# Patient Record
Sex: Male | Born: 1935
Health system: Southern US, Community
[De-identification: ages and names within clinical notes are randomized; demographics above are authoritative.]

## PROBLEM LIST (undated history)

## (undated) DIAGNOSIS — I472 Ventricular tachycardia, unspecified: Secondary | ICD-10-CM

## (undated) DIAGNOSIS — E785 Hyperlipidemia, unspecified: Secondary | ICD-10-CM

## (undated) DIAGNOSIS — L509 Urticaria, unspecified: Secondary | ICD-10-CM

## (undated) DIAGNOSIS — R251 Tremor, unspecified: Secondary | ICD-10-CM

## (undated) DIAGNOSIS — I441 Atrioventricular block, second degree: Secondary | ICD-10-CM

## (undated) DIAGNOSIS — I119 Hypertensive heart disease without heart failure: Secondary | ICD-10-CM

## (undated) DIAGNOSIS — F411 Generalized anxiety disorder: Secondary | ICD-10-CM

## (undated) DIAGNOSIS — I2510 Atherosclerotic heart disease of native coronary artery without angina pectoris: Secondary | ICD-10-CM

## (undated) DIAGNOSIS — Z9289 Personal history of other medical treatment: Secondary | ICD-10-CM

## (undated) DIAGNOSIS — I4892 Unspecified atrial flutter: Secondary | ICD-10-CM

## (undated) DIAGNOSIS — C8330 Diffuse large B-cell lymphoma, unspecified site: Secondary | ICD-10-CM

## (undated) DIAGNOSIS — M1990 Unspecified osteoarthritis, unspecified site: Secondary | ICD-10-CM

## (undated) HISTORY — DX: Hypertensive heart disease without heart failure: I11.9

## (undated) HISTORY — DX: Generalized anxiety disorder: F41.1

## (undated) HISTORY — PX: CATARACT EXTRACTION, BILATERAL: SHX1313

## (undated) HISTORY — DX: Personal history of other medical treatment: Z92.89

## (undated) HISTORY — DX: Urticaria, unspecified: L50.9

## (undated) HISTORY — DX: Hyperlipidemia, unspecified: E78.5

## (undated) HISTORY — DX: Atrioventricular block, second degree: I44.1

## (undated) HISTORY — DX: Unspecified atrial flutter: I48.92

## (undated) HISTORY — DX: Ventricular tachycardia, unspecified: I47.20

## (undated) HISTORY — DX: Atherosclerotic heart disease of native coronary artery without angina pectoris: I25.10

## (undated) HISTORY — DX: Ventricular tachycardia: I47.2

## (undated) HISTORY — DX: Tremor, unspecified: R25.1

---
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
## 2005-08-04 ENCOUNTER — Emergency Department: Payer: Self-pay | Admitting: Emergency Medicine

## 2005-08-19 ENCOUNTER — Ambulatory Visit: Payer: Self-pay | Admitting: Family Medicine

## 2005-08-20 ENCOUNTER — Ambulatory Visit: Payer: Self-pay | Admitting: Oncology

## 2005-08-28 ENCOUNTER — Ambulatory Visit: Payer: Self-pay | Admitting: Internal Medicine

## 2005-09-04 ENCOUNTER — Ambulatory Visit: Payer: Self-pay | Admitting: Oncology

## 2005-09-09 ENCOUNTER — Ambulatory Visit: Payer: Self-pay | Admitting: General Surgery

## 2005-09-09 ENCOUNTER — Other Ambulatory Visit: Payer: Self-pay

## 2005-09-16 ENCOUNTER — Ambulatory Visit: Payer: Self-pay | Admitting: General Surgery

## 2005-09-18 ENCOUNTER — Ambulatory Visit: Payer: Self-pay | Admitting: Oncology

## 2005-10-18 ENCOUNTER — Ambulatory Visit: Payer: Self-pay | Admitting: Oncology

## 2005-11-18 ENCOUNTER — Ambulatory Visit: Payer: Self-pay | Admitting: Oncology

## 2005-12-19 ENCOUNTER — Ambulatory Visit: Payer: Self-pay | Admitting: Oncology

## 2006-01-18 ENCOUNTER — Ambulatory Visit: Payer: Self-pay | Admitting: Oncology

## 2006-01-22 ENCOUNTER — Ambulatory Visit: Payer: Self-pay | Admitting: Nurse Practitioner

## 2006-02-18 ENCOUNTER — Ambulatory Visit: Payer: Self-pay | Admitting: Oncology

## 2006-03-20 ENCOUNTER — Ambulatory Visit: Payer: Self-pay | Admitting: Oncology

## 2006-04-20 ENCOUNTER — Ambulatory Visit: Payer: Self-pay | Admitting: Oncology

## 2006-05-21 ENCOUNTER — Ambulatory Visit: Payer: Self-pay | Admitting: Oncology

## 2006-06-19 ENCOUNTER — Ambulatory Visit: Payer: Self-pay | Admitting: Oncology

## 2006-09-16 ENCOUNTER — Ambulatory Visit: Payer: Self-pay | Admitting: Oncology

## 2006-09-19 ENCOUNTER — Ambulatory Visit: Payer: Self-pay | Admitting: Oncology

## 2006-09-20 ENCOUNTER — Ambulatory Visit: Payer: Self-pay | Admitting: Oncology

## 2006-10-19 ENCOUNTER — Ambulatory Visit: Payer: Self-pay | Admitting: Oncology

## 2006-11-19 ENCOUNTER — Ambulatory Visit: Payer: Self-pay | Admitting: Oncology

## 2007-01-12 ENCOUNTER — Ambulatory Visit: Payer: Self-pay | Admitting: Unknown Physician Specialty

## 2007-01-19 ENCOUNTER — Ambulatory Visit: Payer: Self-pay | Admitting: Oncology

## 2007-02-10 ENCOUNTER — Ambulatory Visit: Payer: Self-pay | Admitting: Oncology

## 2007-02-19 ENCOUNTER — Ambulatory Visit: Payer: Self-pay | Admitting: Oncology

## 2007-04-21 ENCOUNTER — Ambulatory Visit: Payer: Self-pay | Admitting: Oncology

## 2007-04-27 ENCOUNTER — Ambulatory Visit: Payer: Self-pay | Admitting: Unknown Physician Specialty

## 2007-05-04 ENCOUNTER — Ambulatory Visit: Payer: Self-pay | Admitting: Oncology

## 2007-05-06 ENCOUNTER — Ambulatory Visit: Payer: Self-pay | Admitting: Oncology

## 2007-05-22 ENCOUNTER — Ambulatory Visit: Payer: Self-pay | Admitting: Oncology

## 2007-09-06 ENCOUNTER — Ambulatory Visit: Payer: Self-pay | Admitting: Unknown Physician Specialty

## 2007-09-23 ENCOUNTER — Ambulatory Visit: Payer: Self-pay | Admitting: Oncology

## 2007-10-19 ENCOUNTER — Ambulatory Visit: Payer: Self-pay | Admitting: Oncology

## 2007-12-04 ENCOUNTER — Emergency Department: Payer: Self-pay | Admitting: Emergency Medicine

## 2007-12-04 ENCOUNTER — Other Ambulatory Visit: Payer: Self-pay

## 2008-01-19 ENCOUNTER — Ambulatory Visit: Payer: Self-pay | Admitting: Oncology

## 2008-02-19 ENCOUNTER — Ambulatory Visit: Payer: Self-pay | Admitting: Oncology

## 2008-05-17 ENCOUNTER — Ambulatory Visit: Payer: Self-pay | Admitting: Oncology

## 2008-05-21 ENCOUNTER — Ambulatory Visit: Payer: Self-pay | Admitting: Oncology

## 2008-05-25 ENCOUNTER — Ambulatory Visit: Payer: Self-pay | Admitting: Oncology

## 2008-06-18 ENCOUNTER — Ambulatory Visit: Payer: Self-pay | Admitting: Oncology

## 2008-11-18 ENCOUNTER — Ambulatory Visit: Payer: Self-pay | Admitting: Oncology

## 2008-11-21 ENCOUNTER — Ambulatory Visit: Payer: Self-pay | Admitting: Oncology

## 2008-12-19 ENCOUNTER — Ambulatory Visit: Payer: Self-pay | Admitting: Oncology

## 2009-05-21 ENCOUNTER — Ambulatory Visit: Payer: Self-pay | Admitting: Oncology

## 2009-06-18 ENCOUNTER — Ambulatory Visit: Payer: Self-pay | Admitting: Oncology

## 2009-09-29 ENCOUNTER — Emergency Department: Payer: Self-pay | Admitting: Emergency Medicine

## 2009-11-18 ENCOUNTER — Ambulatory Visit: Payer: Self-pay | Admitting: Oncology

## 2009-11-20 ENCOUNTER — Ambulatory Visit: Payer: Self-pay | Admitting: Oncology

## 2009-12-19 ENCOUNTER — Ambulatory Visit: Payer: Self-pay | Admitting: Oncology

## 2010-06-16 ENCOUNTER — Ambulatory Visit: Payer: Self-pay | Admitting: Oncology

## 2010-06-19 ENCOUNTER — Ambulatory Visit: Payer: Self-pay | Admitting: Oncology

## 2010-11-16 ENCOUNTER — Inpatient Hospital Stay: Payer: Self-pay | Admitting: Internal Medicine

## 2010-11-17 HISTORY — PX: CARDIAC CATHETERIZATION: SHX172

## 2011-02-24 DIAGNOSIS — I25118 Atherosclerotic heart disease of native coronary artery with other forms of angina pectoris: Secondary | ICD-10-CM | POA: Insufficient documentation

## 2011-02-24 DIAGNOSIS — I452 Bifascicular block: Secondary | ICD-10-CM | POA: Insufficient documentation

## 2011-02-24 DIAGNOSIS — I2510 Atherosclerotic heart disease of native coronary artery without angina pectoris: Secondary | ICD-10-CM | POA: Insufficient documentation

## 2011-06-18 ENCOUNTER — Ambulatory Visit: Payer: Self-pay | Admitting: Oncology

## 2011-06-18 LAB — CBC CANCER CENTER
Basophil #: 0 x10 3/mm (ref 0.0–0.1)
Eosinophil #: 0.2 x10 3/mm (ref 0.0–0.7)
Eosinophil %: 4 %
Lymphocyte #: 1.6 x10 3/mm (ref 1.0–3.6)
Lymphocyte %: 30.4 %
MCH: 31.4 pg (ref 26.0–34.0)
MCV: 91 fL (ref 80–100)
Monocyte #: 0.4 x10 3/mm (ref 0.0–0.7)
Neutrophil %: 57.8 %
Platelet: 130 x10 3/mm — ABNORMAL LOW (ref 150–440)
RBC: 4.2 10*6/uL — ABNORMAL LOW (ref 4.40–5.90)
RDW: 12.9 % (ref 11.5–14.5)

## 2011-06-18 LAB — COMPREHENSIVE METABOLIC PANEL
Albumin: 3.8 g/dL (ref 3.4–5.0)
Alkaline Phosphatase: 57 U/L (ref 50–136)
Anion Gap: 8 (ref 7–16)
BUN: 23 mg/dL — ABNORMAL HIGH (ref 7–18)
Calcium, Total: 9.6 mg/dL (ref 8.5–10.1)
Creatinine: 1.32 mg/dL — ABNORMAL HIGH (ref 0.60–1.30)
Potassium: 4.5 mmol/L (ref 3.5–5.1)
SGOT(AST): 34 U/L (ref 15–37)
Total Protein: 7.2 g/dL (ref 6.4–8.2)

## 2011-06-18 LAB — LACTATE DEHYDROGENASE: LDH: 152 U/L (ref 87–241)

## 2011-06-19 ENCOUNTER — Ambulatory Visit: Payer: Self-pay | Admitting: Oncology

## 2012-06-18 ENCOUNTER — Ambulatory Visit: Payer: Self-pay | Admitting: Oncology

## 2012-06-22 LAB — COMPREHENSIVE METABOLIC PANEL
Albumin: 3.7 g/dL (ref 3.4–5.0)
Alkaline Phosphatase: 58 U/L (ref 50–136)
BUN: 21 mg/dL — ABNORMAL HIGH (ref 7–18)
Bilirubin,Total: 0.6 mg/dL (ref 0.2–1.0)
Calcium, Total: 9.1 mg/dL (ref 8.5–10.1)
Chloride: 103 mmol/L (ref 98–107)
Co2: 33 mmol/L — ABNORMAL HIGH (ref 21–32)
Creatinine: 1.27 mg/dL (ref 0.60–1.30)
EGFR (African American): >60
EGFR (Non-African Amer.): 52 — ABNORMAL LOW
Glucose: 96 mg/dL (ref 65–99)
Osmolality: 284 (ref 275–301)
Potassium: 4.4 mmol/L (ref 3.5–5.1)
SGOT(AST): 53 U/L — ABNORMAL HIGH (ref 15–37)

## 2012-06-22 LAB — CBC CANCER CENTER
HCT: 37.5 % — ABNORMAL LOW (ref 40.0–52.0)
HGB: 12.8 g/dL — ABNORMAL LOW (ref 13.0–18.0)
Lymphocyte #: 1.4 x10 3/mm (ref 1.0–3.6)
MCHC: 34.2 g/dL (ref 32.0–36.0)
MCV: 91 fL (ref 80–100)
Monocyte %: 8.2 %
Neutrophil %: 54.8 %
Platelet: 104 x10 3/mm — ABNORMAL LOW (ref 150–440)
RDW: 13 % (ref 11.5–14.5)
WBC: 4.6 x10 3/mm (ref 3.8–10.6)

## 2012-06-22 LAB — LACTATE DEHYDROGENASE: LDH: 174 U/L (ref 85–241)

## 2012-07-19 ENCOUNTER — Ambulatory Visit: Payer: Self-pay | Admitting: Oncology

## 2013-06-02 ENCOUNTER — Emergency Department: Payer: Self-pay | Admitting: Emergency Medicine

## 2013-06-02 LAB — HEPATIC FUNCTION PANEL A (ARMC)
ALBUMIN: 4.1 g/dL (ref 3.4–5.0)
ALK PHOS: 52 U/L
ALT: 52 U/L (ref 12–78)
Bilirubin, Direct: 0.1 mg/dL (ref 0.00–0.20)
Bilirubin,Total: 0.4 mg/dL (ref 0.2–1.0)
SGOT(AST): 53 U/L — ABNORMAL HIGH (ref 15–37)
Total Protein: 7.2 g/dL (ref 6.4–8.2)

## 2013-06-02 LAB — CBC WITH DIFFERENTIAL/PLATELET
Basophil #: 0 10*3/uL (ref 0.0–0.1)
Basophil %: 0.3 %
Eosinophil #: 0.2 10*3/uL (ref 0.0–0.7)
Eosinophil %: 2.8 %
HCT: 38 % — ABNORMAL LOW (ref 40.0–52.0)
HGB: 13.1 g/dL (ref 13.0–18.0)
LYMPHS ABS: 1.3 10*3/uL (ref 1.0–3.6)
LYMPHS PCT: 22.3 %
MCH: 31.7 pg (ref 26.0–34.0)
MCHC: 34.4 g/dL (ref 32.0–36.0)
MCV: 92 fL (ref 80–100)
MONOS PCT: 6.7 %
Monocyte #: 0.4 x10 3/mm (ref 0.2–1.0)
Neutrophil #: 4.1 10*3/uL (ref 1.4–6.5)
Neutrophil %: 67.9 %
Platelet: 120 10*3/uL — ABNORMAL LOW (ref 150–440)
RBC: 4.12 10*6/uL — AB (ref 4.40–5.90)
RDW: 12.8 % (ref 11.5–14.5)
WBC: 6 10*3/uL (ref 3.8–10.6)

## 2013-06-02 LAB — URINALYSIS, COMPLETE
Bacteria: NONE SEEN
Bilirubin,UR: NEGATIVE
Blood: NEGATIVE
GLUCOSE, UR: NEGATIVE mg/dL (ref 0–75)
Ketone: NEGATIVE
LEUKOCYTE ESTERASE: NEGATIVE
Nitrite: NEGATIVE
PH: 5 (ref 4.5–8.0)
Protein: NEGATIVE
RBC, UR: NONE SEEN /HPF (ref 0–5)
SPECIFIC GRAVITY: 1.008 (ref 1.003–1.030)
SQUAMOUS EPITHELIAL: NONE SEEN

## 2013-06-02 LAB — BASIC METABOLIC PANEL
Anion Gap: 4 — ABNORMAL LOW (ref 7–16)
BUN: 26 mg/dL — ABNORMAL HIGH (ref 7–18)
CHLORIDE: 103 mmol/L (ref 98–107)
Calcium, Total: 9.5 mg/dL (ref 8.5–10.1)
Co2: 31 mmol/L (ref 21–32)
Creatinine: 1.24 mg/dL (ref 0.60–1.30)
EGFR (African American): >60
EGFR (Non-African Amer.): 53 — ABNORMAL LOW
Glucose: 90 mg/dL (ref 65–99)
Osmolality: 280 (ref 275–301)
Potassium: 4.9 mmol/L (ref 3.5–5.1)
Sodium: 138 mmol/L (ref 136–145)

## 2013-06-02 LAB — LIPASE, BLOOD: Lipase: 137 U/L (ref 73–393)

## 2013-06-02 LAB — TROPONIN I: Troponin-I: <0.02 ng/mL

## 2013-06-21 ENCOUNTER — Ambulatory Visit: Payer: Self-pay | Admitting: Oncology

## 2013-06-21 LAB — CBC CANCER CENTER
Basophil #: 0 x10 3/mm (ref 0.0–0.1)
Basophil %: 0.6 %
EOS ABS: 0.2 x10 3/mm (ref 0.0–0.7)
Eosinophil %: 4.1 %
HCT: 38.6 % — AB (ref 40.0–52.0)
HGB: 12.6 g/dL — AB (ref 13.0–18.0)
Lymphocyte #: 1.4 x10 3/mm (ref 1.0–3.6)
Lymphocyte %: 27.9 %
MCH: 30.1 pg (ref 26.0–34.0)
MCHC: 32.7 g/dL (ref 32.0–36.0)
MCV: 92 fL (ref 80–100)
MONOS PCT: 7.5 %
Monocyte #: 0.4 x10 3/mm (ref 0.2–1.0)
NEUTROS ABS: 3 x10 3/mm (ref 1.4–6.5)
Neutrophil %: 59.9 %
PLATELETS: 100 x10 3/mm — AB (ref 150–440)
RBC: 4.19 10*6/uL — ABNORMAL LOW (ref 4.40–5.90)
RDW: 12.9 % (ref 11.5–14.5)
WBC: 5.1 x10 3/mm (ref 3.8–10.6)

## 2013-06-21 LAB — COMPREHENSIVE METABOLIC PANEL
ALBUMIN: 3.6 g/dL (ref 3.4–5.0)
ALK PHOS: 49 U/L
AST: 34 U/L (ref 15–37)
Anion Gap: 4 — ABNORMAL LOW (ref 7–16)
BUN: 23 mg/dL — AB (ref 7–18)
Bilirubin,Total: 0.3 mg/dL (ref 0.2–1.0)
CALCIUM: 9 mg/dL (ref 8.5–10.1)
CO2: 32 mmol/L (ref 21–32)
Chloride: 105 mmol/L (ref 98–107)
Creatinine: 1.18 mg/dL (ref 0.60–1.30)
GFR CALC NON AF AMER: 57 — AB
GLUCOSE: 104 mg/dL — AB (ref 65–99)
OSMOLALITY: 285 (ref 275–301)
Potassium: 5 mmol/L (ref 3.5–5.1)
SGPT (ALT): 43 U/L (ref 12–78)
SODIUM: 141 mmol/L (ref 136–145)
Total Protein: 7 g/dL (ref 6.4–8.2)

## 2013-07-19 ENCOUNTER — Ambulatory Visit: Payer: Self-pay | Admitting: Oncology

## 2013-08-30 ENCOUNTER — Encounter: Payer: Self-pay | Admitting: Cardiovascular Disease

## 2013-08-30 ENCOUNTER — Ambulatory Visit (INDEPENDENT_AMBULATORY_CARE_PROVIDER_SITE_OTHER): Payer: Medicare Other | Admitting: Cardiovascular Disease

## 2013-08-30 VITALS — BP 142/84 | HR 71 | Ht 68.5 in | Wt 175.5 lb

## 2013-08-30 DIAGNOSIS — I4729 Other ventricular tachycardia: Secondary | ICD-10-CM

## 2013-08-30 DIAGNOSIS — F419 Anxiety disorder, unspecified: Secondary | ICD-10-CM | POA: Insufficient documentation

## 2013-08-30 DIAGNOSIS — Z8679 Personal history of other diseases of the circulatory system: Secondary | ICD-10-CM | POA: Insufficient documentation

## 2013-08-30 DIAGNOSIS — I10 Essential (primary) hypertension: Secondary | ICD-10-CM

## 2013-08-30 DIAGNOSIS — F411 Generalized anxiety disorder: Secondary | ICD-10-CM

## 2013-08-30 DIAGNOSIS — R42 Dizziness and giddiness: Secondary | ICD-10-CM

## 2013-08-30 DIAGNOSIS — R9431 Abnormal electrocardiogram [ECG] [EKG]: Secondary | ICD-10-CM | POA: Insufficient documentation

## 2013-08-30 DIAGNOSIS — I472 Ventricular tachycardia, unspecified: Secondary | ICD-10-CM

## 2013-08-30 DIAGNOSIS — C8590 Non-Hodgkin lymphoma, unspecified, unspecified site: Secondary | ICD-10-CM | POA: Insufficient documentation

## 2013-08-30 DIAGNOSIS — C8589 Other specified types of non-Hodgkin lymphoma, extranodal and solid organ sites: Secondary | ICD-10-CM

## 2013-08-30 NOTE — Assessment & Plan Note (Signed)
Prior notes and 2012 suggesting he had sustained VT. He denies having any further episodes even under the observation of his prior cardiologist. Burnis Medin suggest a 30 day monitor for any recurrent dizziness not associated with standing/orthostasis.

## 2013-08-30 NOTE — Assessment & Plan Note (Signed)
Dizziness symptoms were discussed with him. Very concerning in the setting of prior ventricular tachycardia per the notes from 2012 at [facility]. Symptoms only happen when standing, not when sitting or laying down suggesting this could be orthostasis. No symptoms since his enalapril dose was decreased. After long discussion with the patient and his wife, we will hold off on a 30 day monitor her own as he has additional symptoms particularly with sitting or laying down. Encouraged him to drink more fluids as tolerated. They will call me for any additional symptoms and monitor will be ordered. No further ischemia workup as he had minimal CAD several years ago. Normal echocardiogram..

## 2013-08-30 NOTE — Assessment & Plan Note (Signed)
Previously noted right bundle branch block, left anterior fascicular block. No further workup at this time. He symptomatically.

## 2013-08-30 NOTE — Patient Instructions (Signed)
You are doing well. No medication changes were made.  Please call the office if you have more dizzy spells,  Especially when sitting or laying down  Please call us if you have new issues that need to be addressed before your next appt.  Your physician wants you to follow-up in: 12 months.  You will receive a reminder letter in the mail two months in advance. If you don't receive a letter, please call our office to schedule the follow-up appointment.

## 2013-08-30 NOTE — Assessment & Plan Note (Signed)
Followed by [name]. 

## 2013-08-30 NOTE — Assessment & Plan Note (Signed)
Blood pressure continues to be well controlled on low-dose enalapril. No changes made Orthostatics done today. Systolic pressure 940 supine, 140 sitting, 132 standing. Minimal change in heart rate at 2 minutes. Heart rate up from 50s up to 70 after 5 minutes of standing

## 2013-08-30 NOTE — Assessment & Plan Note (Addendum)
Followed at [facility] with periodic observation. Previously had 7 rounds of chemotherapy

## 2013-08-30 NOTE — Progress Notes (Signed)
Patient ID: Clinton Joseph, male    DOB: [birth year], 80 y.o.   MRN: [id number]  HPI Comments: [name] is a very pleasant 80 year old gentleman previously seen by a cardiologist out of town presents to our office to establish care. Prior history of lymphoma 2006 with chemotherapy x7 cycles at that time. Notes indicating run of VT, sustained with presentation to the emergency room in 2012. Rate was 150 beats per minute lasting more than 3 seconds. He was given IV amiodarone and converted to normal sinus rhythm. Evaluation at that time showing nonobstructive CAD, normal ejection fraction. History of hypertension  He presents by referral from Dr. [name] for dizziness.  Reports that his dizziness typically happens when standing, never when sitting or laying down. Symptoms seem to have resolved over the past few weeks after his enalapril dose was decreased from 20 mg down to 10 mg. Wife reports that he has also been drinking more fluids. Prior to that he did not drink very much. Overall he reports that he feels well. He is not very active, does not do a regular exercise program. Notes indicate a history of anxiety, free diabetes  EKG today  shows normal sinus rhythm with rate 59 beats a minute, right bundle branch block, left anterior fascicular block  Prior cardiac catheterization showed 40% proximal RCA disease, minimal mid left circumflex disease normal ejection fraction at that time,  Prior echocardiogram showing normal LV systolic function, otherwise normal study prior EKGs and 2012 showed normal sinus rhythm with right bundle branch block, left anterior fascicular block    Outpatient Encounter Prescriptions as of 08/30/2013  Medication Sig  . aspirin 81 MG tablet Take 81 mg by mouth daily.  . enalapril (VASOTEC) 10 MG tablet Take 10 mg by mouth daily.  . ferrous sulfate 325 (65 FE) MG tablet Take 325 mg by mouth daily with breakfast.  . loratadine (CLARITIN) 10 MG tablet Take 10 mg by  mouth daily.  . Multiple Vitamin (MULTIVITAMIN) tablet Take 1 tablet by mouth daily.  . Omega-3 Fatty Acids (FISH OIL) 1000 MG CAPS Take 1,200 mg by mouth 2 (two) times daily.   . vitamin C (ASCORBIC ACID) 500 MG tablet Take 500 mg by mouth daily.  . [DISCONTINUED] enalapril (VASOTEC) 20 MG tablet Take 10 mg by mouth daily.  . [DISCONTINUED] ranitidine (ZANTAC) 75 MG tablet Take 150 mg by mouth daily.  . [DISCONTINUED] vitamin E (VITAMIN E) 400 UNIT capsule Take 400 Units by mouth daily.     Review of Systems  Constitutional: Negative.   HENT: Negative.   Eyes: Negative.   Respiratory: Negative.   Cardiovascular: Negative.   Gastrointestinal: Negative.   Endocrine: Negative.   Musculoskeletal: Negative.   Skin: Negative.   Allergic/Immunologic: Negative.   Neurological: Positive for dizziness.  Hematological: Negative.   Psychiatric/Behavioral: Negative.   All other systems reviewed and are negative.   BP 142/84  Pulse 71  Ht 5' 8.5" (1.74 m)  Wt 175 lb 8 oz (79.606 kg)  BMI 26.29 kg/m2  Physical Exam  Nursing note and vitals reviewed. Constitutional: He is oriented to person, place, and time. He appears well-developed and well-nourished.  HENT:  Head: Normocephalic.  Nose: Nose normal.  Mouth/Throat: Oropharynx is clear and moist.  Eyes: Conjunctivae are normal. Pupils are equal, round, and reactive to light.  Neck: Normal range of motion. Neck supple. No JVD present.  Cardiovascular: Normal rate, regular rhythm, S1 normal, S2 normal, normal heart sounds and intact distal pulses.  Exam reveals no gallop and no friction rub.   No murmur heard. Pulmonary/Chest: Effort normal and breath sounds normal. No respiratory distress. He has no wheezes. He has no rales. He exhibits no tenderness.  Abdominal: Soft. Bowel sounds are normal. He exhibits no distension. There is no tenderness.  Musculoskeletal: Normal range of motion. He exhibits no edema and no tenderness.   Lymphadenopathy:    He has no cervical adenopathy.  Neurological: He is alert and oriented to person, place, and time. Coordination normal.  Skin: Skin is warm and dry. No rash noted. No erythema.  Psychiatric: He has a normal mood and affect. His behavior is normal. Judgment and thought content normal.      Assessment and Plan

## 2013-12-03 ENCOUNTER — Inpatient Hospital Stay: Payer: Self-pay | Admitting: Internal Medicine

## 2013-12-03 LAB — BASIC METABOLIC PANEL
Anion Gap: 9 (ref 7–16)
BUN: 25 mg/dL — ABNORMAL HIGH (ref 7–18)
Calcium, Total: 9.2 mg/dL (ref 8.5–10.1)
Chloride: 105 mmol/L (ref 98–107)
Co2: 29 mmol/L (ref 21–32)
Creatinine: 1.36 mg/dL — ABNORMAL HIGH (ref 0.60–1.30)
EGFR (African American): 55 — ABNORMAL LOW
EGFR (Non-African Amer.): 48 — ABNORMAL LOW
GLUCOSE: 119 mg/dL — AB (ref 65–99)
Osmolality: 291 (ref 275–301)
Potassium: 4.5 mmol/L (ref 3.5–5.1)
SODIUM: 143 mmol/L (ref 136–145)

## 2013-12-03 LAB — HEPATIC FUNCTION PANEL A (ARMC)
ALT: 42 U/L
Albumin: 3.7 g/dL (ref 3.4–5.0)
Alkaline Phosphatase: 52 U/L
BILIRUBIN DIRECT: 0.1 mg/dL (ref 0.00–0.20)
BILIRUBIN TOTAL: 0.5 mg/dL (ref 0.2–1.0)
SGOT(AST): 45 U/L — ABNORMAL HIGH (ref 15–37)
TOTAL PROTEIN: 7.5 g/dL (ref 6.4–8.2)

## 2013-12-03 LAB — CBC WITH DIFFERENTIAL/PLATELET
Basophil #: 0 10*3/uL (ref 0.0–0.1)
Basophil %: 0.4 %
EOS ABS: 0.2 10*3/uL (ref 0.0–0.7)
Eosinophil %: 3.3 %
HCT: 40.1 % (ref 40.0–52.0)
HGB: 13.1 g/dL (ref 13.0–18.0)
LYMPHS ABS: 1.8 10*3/uL (ref 1.0–3.6)
LYMPHS PCT: 29.2 %
MCH: 30.8 pg (ref 26.0–34.0)
MCHC: 32.6 g/dL (ref 32.0–36.0)
MCV: 94 fL (ref 80–100)
MONOS PCT: 6.8 %
Monocyte #: 0.4 x10 3/mm (ref 0.2–1.0)
NEUTROS ABS: 3.8 10*3/uL (ref 1.4–6.5)
NEUTROS PCT: 60.3 %
Platelet: 117 10*3/uL — ABNORMAL LOW (ref 150–440)
RBC: 4.25 10*6/uL — ABNORMAL LOW (ref 4.40–5.90)
RDW: 13 % (ref 11.5–14.5)
WBC: 6.2 10*3/uL (ref 3.8–10.6)

## 2013-12-03 LAB — TROPONIN I
Troponin-I: <0.02 ng/mL
Troponin-I: <0.02 ng/mL
Troponin-I: <0.02 ng/mL

## 2013-12-04 ENCOUNTER — Inpatient Hospital Stay (HOSPITAL_COMMUNITY)
Admission: AD | Admit: 2013-12-04 | Discharge: 2013-12-06 | DRG: 244 | Disposition: A | Discharge status: Home / Self Care | Payer: Medicare Other | Source: Other Acute Inpatient Hospital | Attending: Internal Medicine | Admitting: Internal Medicine

## 2013-12-04 ENCOUNTER — Other Ambulatory Visit: Payer: Self-pay | Admitting: Nurse Practitioner

## 2013-12-04 DIAGNOSIS — R55 Syncope and collapse: Secondary | ICD-10-CM | POA: Diagnosis present

## 2013-12-04 DIAGNOSIS — I498 Other specified cardiac arrhythmias: Secondary | ICD-10-CM | POA: Diagnosis present

## 2013-12-04 DIAGNOSIS — Z881 Allergy status to other antibiotic agents status: Secondary | ICD-10-CM | POA: Diagnosis not present

## 2013-12-04 DIAGNOSIS — Z880 Allergy status to penicillin: Secondary | ICD-10-CM

## 2013-12-04 DIAGNOSIS — Z7982 Long term (current) use of aspirin: Secondary | ICD-10-CM

## 2013-12-04 DIAGNOSIS — E785 Hyperlipidemia, unspecified: Secondary | ICD-10-CM | POA: Diagnosis present

## 2013-12-04 DIAGNOSIS — I442 Atrioventricular block, complete: Secondary | ICD-10-CM

## 2013-12-04 DIAGNOSIS — I459 Conduction disorder, unspecified: Secondary | ICD-10-CM

## 2013-12-04 DIAGNOSIS — Z87898 Personal history of other specified conditions: Secondary | ICD-10-CM | POA: Diagnosis not present

## 2013-12-04 DIAGNOSIS — F411 Generalized anxiety disorder: Secondary | ICD-10-CM | POA: Diagnosis present

## 2013-12-04 DIAGNOSIS — I2510 Atherosclerotic heart disease of native coronary artery without angina pectoris: Secondary | ICD-10-CM | POA: Diagnosis present

## 2013-12-04 DIAGNOSIS — Z9221 Personal history of antineoplastic chemotherapy: Secondary | ICD-10-CM | POA: Diagnosis not present

## 2013-12-04 DIAGNOSIS — I441 Atrioventricular block, second degree: Secondary | ICD-10-CM | POA: Diagnosis present

## 2013-12-04 DIAGNOSIS — Z950 Presence of cardiac pacemaker: Secondary | ICD-10-CM

## 2013-12-04 DIAGNOSIS — I10 Essential (primary) hypertension: Secondary | ICD-10-CM | POA: Diagnosis present

## 2013-12-04 HISTORY — DX: Diffuse large B-cell lymphoma, unspecified site: C83.30

## 2013-12-04 HISTORY — DX: Unspecified osteoarthritis, unspecified site: M19.90

## 2013-12-04 LAB — BASIC METABOLIC PANEL
Anion Gap: 7 (ref 7–16)
BUN: 23 mg/dL — ABNORMAL HIGH (ref 7–18)
CREATININE: 1.37 mg/dL — AB (ref 0.60–1.30)
Calcium, Total: 8.9 mg/dL (ref 8.5–10.1)
Chloride: 109 mmol/L — ABNORMAL HIGH (ref 98–107)
Co2: 28 mmol/L (ref 21–32)
EGFR (African American): 55 — ABNORMAL LOW
EGFR (Non-African Amer.): 47 — ABNORMAL LOW
Glucose: 98 mg/dL (ref 65–99)
Osmolality: 290 (ref 275–301)
Potassium: 4.4 mmol/L (ref 3.5–5.1)
Sodium: 144 mmol/L (ref 136–145)

## 2013-12-04 LAB — HEMOGLOBIN A1C: Hemoglobin A1C: 5.9 % (ref 4.2–6.3)

## 2013-12-04 LAB — MRSA PCR SCREENING: MRSA by PCR: NEGATIVE

## 2013-12-04 LAB — MAGNESIUM: MAGNESIUM: 2.2 mg/dL

## 2013-12-04 LAB — TSH: THYROID STIMULATING HORM: 2.12 u[IU]/mL

## 2013-12-04 MED ORDER — CHLORHEXIDINE GLUCONATE 4 % EX LIQD: 60.0000 mL | Freq: Once | CUTANEOUS | Status: DC

## 2013-12-04 MED ORDER — ENALAPRIL MALEATE 5 MG PO TABS
5.0000 mg | ORAL_TABLET | Freq: Every day | ORAL | Status: DC
  Administered 2013-12-05: 5 mg via ORAL
  Filled 2013-12-04: qty 1

## 2013-12-04 MED ORDER — LORATADINE 10 MG PO TABS
10.0000 mg | ORAL_TABLET | Freq: Every day | ORAL | Status: DC
  Filled 2013-12-04: qty 1

## 2013-12-04 MED ORDER — ASPIRIN 81 MG PO CHEW
81.0000 mg | CHEWABLE_TABLET | Freq: Every day | ORAL | Status: DC
  Administered 2013-12-04 – 2013-12-05 (×2): 81 mg via ORAL
  Filled 2013-12-04 (×2): qty 1

## 2013-12-04 MED ORDER — VANCOMYCIN HCL IN DEXTROSE 1-5 GM/200ML-% IV SOLN
1000.0000 mg | INTRAVENOUS | Status: DC
  Filled 2013-12-04: qty 200

## 2013-12-04 MED ORDER — SODIUM CHLORIDE 0.9 % IV SOLN
INTRAVENOUS | Status: DC
  Administered 2013-12-05: 03:00:00 via INTRAVENOUS

## 2013-12-04 MED ORDER — HYDRALAZINE HCL 20 MG/ML IJ SOLN: 10.0000 mg | Freq: Four times a day (QID) | INTRAMUSCULAR | Status: DC | PRN

## 2013-12-04 MED ORDER — FERROUS SULFATE 325 (65 FE) MG PO TABS
325.0000 mg | ORAL_TABLET | Freq: Every day | ORAL | Status: DC
  Administered 2013-12-05 – 2013-12-06 (×2): 325 mg via ORAL
  Filled 2013-12-04 (×3): qty 1

## 2013-12-04 MED ORDER — SODIUM CHLORIDE 0.9 % IR SOLN
80.0000 mg | Status: DC
  Filled 2013-12-04: qty 2

## 2013-12-04 MED ORDER — PANTOPRAZOLE SODIUM 40 MG PO TBEC
40.0000 mg | DELAYED_RELEASE_TABLET | Freq: Every day | ORAL | Status: DC
  Administered 2013-12-05 – 2013-12-06 (×2): 40 mg via ORAL
  Filled 2013-12-04 (×2): qty 1

## 2013-12-04 MED ORDER — CHLORHEXIDINE GLUCONATE 4 % EX LIQD
60.0000 mL | Freq: Once | CUTANEOUS | Status: AC
  Administered 2013-12-05: 4 via TOPICAL
  Filled 2013-12-04: qty 60

## 2013-12-04 MED ORDER — HEPARIN SODIUM (PORCINE) 5000 UNIT/ML IJ SOLN
5000.0000 [IU] | Freq: Three times a day (TID) | INTRAMUSCULAR | Status: DC
  Administered 2013-12-04 – 2013-12-05 (×2): 5000 [IU] via SUBCUTANEOUS
  Filled 2013-12-04 (×5): qty 1

## 2013-12-04 MED ORDER — ASPIRIN 81 MG PO CHEW: 81.0000 mg | CHEWABLE_TABLET | Freq: Every day | ORAL | Status: DC

## 2013-12-04 MED ORDER — CHLORHEXIDINE GLUCONATE 4 % EX LIQD
60.0000 mL | Freq: Once | CUTANEOUS | Status: AC
  Administered 2013-12-04: 4 via TOPICAL
  Filled 2013-12-04: qty 60

## 2013-12-04 MED ORDER — FLUTICASONE PROPIONATE 50 MCG/ACT NA SUSP
2.0000 | Freq: Every day | NASAL | Status: DC
  Filled 2013-12-04: qty 16

## 2013-12-04 MED ORDER — LORATADINE 10 MG PO TABS
10.0000 mg | ORAL_TABLET | Freq: Every day | ORAL | Status: DC
  Administered 2013-12-05 – 2013-12-06 (×2): 10 mg via ORAL
  Filled 2013-12-04 (×2): qty 1
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
## 2013-12-04 NOTE — Progress Notes (Signed)
Pt received from [facility], report given to myself by [facility] at bedside. Patient with no complaints. Orders received from Ignacia Bayley for ICD in am.

## 2013-12-05 ENCOUNTER — Ambulatory Visit (HOSPITAL_COMMUNITY): Admission: RE | Admit: 2013-12-05 | Payer: Medicare Other | Source: Ambulatory Visit | Admitting: Internal Medicine

## 2013-12-05 ENCOUNTER — Encounter (HOSPITAL_COMMUNITY): Payer: Self-pay | Admitting: *Deleted

## 2013-12-05 ENCOUNTER — Encounter (HOSPITAL_COMMUNITY)
Admission: AD | Disposition: A | Discharge status: Home / Self Care | Payer: Self-pay | Source: Other Acute Inpatient Hospital | Attending: Internal Medicine

## 2013-12-05 ENCOUNTER — Encounter (HOSPITAL_COMMUNITY): Admission: RE | Payer: Self-pay | Source: Ambulatory Visit

## 2013-12-05 DIAGNOSIS — I441 Atrioventricular block, second degree: Secondary | ICD-10-CM

## 2013-12-05 HISTORY — PX: PERMANENT PACEMAKER INSERTION: SHX5480

## 2013-12-05 HISTORY — PX: INSERT / REPLACE / REMOVE PACEMAKER: SUR710

## 2013-12-05 LAB — CBC
HCT: 38.2 % — ABNORMAL LOW (ref 39.0–52.0)
HEMOGLOBIN: 13 g/dL (ref 13.0–17.0)
MCH: 31 pg (ref 26.0–34.0)
MCHC: 34 g/dL (ref 30.0–36.0)
MCV: 91 fL (ref 78.0–100.0)
PLATELETS: 113 10*3/uL — AB (ref 150–400)
RBC: 4.2 MIL/uL — ABNORMAL LOW (ref 4.22–5.81)
RDW: 13 % (ref 11.5–15.5)
WBC: 6.2 10*3/uL (ref 4.0–10.5)

## 2013-12-05 LAB — BASIC METABOLIC PANEL
Anion gap: 14 (ref 5–15)
BUN: 21 mg/dL (ref 6–23)
CALCIUM: 9.2 mg/dL (ref 8.4–10.5)
CO2: 21 mEq/L (ref 19–32)
Chloride: 107 mEq/L (ref 96–112)
Creatinine, Ser: 1.24 mg/dL (ref 0.50–1.35)
GFR calc non Af Amer: 52 mL/min — ABNORMAL LOW (ref >90)
GFR, EST AFRICAN AMERICAN: 60 mL/min — AB (ref >90)
GLUCOSE: 111 mg/dL — AB (ref 70–99)
Potassium: 4.5 mEq/L (ref 3.7–5.3)
SODIUM: 142 meq/L (ref 137–147)

## 2013-12-05 SURGERY — PERMANENT PACEMAKER INSERTION
Anesthesia: LOCAL

## 2013-12-05 MED ORDER — WHITE PETROLATUM GEL
Status: AC
  Administered 2013-12-05: 0.2
  Filled 2013-12-05: qty 5

## 2013-12-05 MED ORDER — ACETAMINOPHEN 325 MG PO TABS
650.0000 mg | ORAL_TABLET | Freq: Four times a day (QID) | ORAL | Status: DC | PRN
  Administered 2013-12-05: 650 mg via ORAL
  Filled 2013-12-05: qty 2

## 2013-12-05 MED ORDER — ONDANSETRON HCL 4 MG/2ML IJ SOLN: 4.0000 mg | Freq: Four times a day (QID) | INTRAMUSCULAR | Status: DC | PRN

## 2013-12-05 MED ORDER — SODIUM CHLORIDE 0.9 % IJ SOLN
3.0000 mL | Freq: Two times a day (BID) | INTRAMUSCULAR | Status: DC
  Administered 2013-12-05 – 2013-12-06 (×2): 3 mL via INTRAVENOUS

## 2013-12-05 MED ORDER — ACETAMINOPHEN 325 MG PO TABS: 325.0000 mg | ORAL_TABLET | ORAL | Status: DC | PRN

## 2013-12-05 MED ORDER — HEPARIN (PORCINE) IN NACL 2-0.9 UNIT/ML-% IJ SOLN
INTRAMUSCULAR | Status: AC
  Filled 2013-12-05: qty 500

## 2013-12-05 MED ORDER — LIDOCAINE HCL (PF) 1 % IJ SOLN
INTRAMUSCULAR | Status: AC
  Filled 2013-12-05: qty 30

## 2013-12-05 MED ORDER — SODIUM CHLORIDE 0.9 % IJ SOLN: 3.0000 mL | INTRAMUSCULAR | Status: DC | PRN

## 2013-12-05 MED ORDER — VANCOMYCIN HCL IN DEXTROSE 1-5 GM/200ML-% IV SOLN
1000.0000 mg | Freq: Two times a day (BID) | INTRAVENOUS | Status: AC
  Administered 2013-12-05: 1000 mg via INTRAVENOUS
  Filled 2013-12-05: qty 200

## 2013-12-05 MED ORDER — SODIUM CHLORIDE 0.9 % IV SOLN: 250.0000 mL | INTRAVENOUS | Status: DC | PRN

## 2013-12-05 MED ORDER — ENALAPRIL MALEATE 10 MG PO TABS
10.0000 mg | ORAL_TABLET | Freq: Every day | ORAL | Status: DC
Start: 2013-12-06 — End: 2013-12-06
  Administered 2013-12-06: 10 mg via ORAL
  Filled 2013-12-05: qty 1

## 2013-12-05 MED ORDER — HYDROCODONE-ACETAMINOPHEN 5-325 MG PO TABS: 1.0000 | ORAL_TABLET | ORAL | Status: DC | PRN

## 2013-12-05 MED ORDER — CHLORHEXIDINE GLUCONATE 4 % EX LIQD
CUTANEOUS | Status: AC
  Filled 2013-12-05: qty 60
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
## 2013-12-05 NOTE — Consult Note (Signed)
ELECTROPHYSIOLOGY CONSULT NOTE    Patient ID: Clinton Joseph MRN: [id number], DOB/AGE: 1928/01/30 80 y.o.  Admit date: 12/04/2013 Date of Consult: 12-05-13  Primary Physician: Coralie Common, MD Primary Cardiologist: Fletcher Anon  Reason for Consultation: syncope and heart block  HPI:  Clinton Joseph is a 80 y.o. male with a past medical history significant for lymphoma, hypertension, and hyperlipidemia with minimally obstructive CAD by cath 2012.  He has had previous EP evaluation at [facility] in 2012 at which time there was concern for VT in the setting of tremors.  Strips were reviewed and felt to be artifact and in the setting of normal cath and normal LV function, further work up was not recommended.  He has done well since that time until a few months ago when he began to have intermittent dizziness and weakness that was mostly when standing.  His enalapril dose was decreased and his anti-depression medication (family cannot remember name) was discontinued.  He felt that he was improved but then developed recurrent intermittent symptoms several times per week.  On the day of admission, he was driving to church when he developed abrupt loss of consciousness with no prodrome.  His wife was able to steer the [place] to park.  He regained consciousness after about a minute and was able to finish parking the [place].  He continued on to church but had persistent weakness.  After lunch and with protracted symptoms, he presented to [facility] for further evaluation.  He was found to be bradycardic with rates in the 40's, periods of CHB and pauses of >6 seconds.  He was transferred to [facility] for further evaluation.  Lab work at [facility] was notable for normal TSH and electrolytes. He has been on no AV nodal blocking agents.  Echocardiogram yesterday demonstrated EF 55-60%, no RWMA, mildly dilated LA, mild to moderate aortic valve sclerosis without stenosis.   Prior to admission, he denies chest pain, shortness of breath,  LE edema, focal neurological defects, recent fevers, chills, nausea or vomiting.  ROS is otherwise negative except as outlined above.  EP has been asked to evaluate for treatment options.   Past Medical History  Diagnosis Date  . Unspecified essential hypertension   . Anxiety state, unspecified   . Diffuse large B cell lymphoma   . Tremor     right hand  . Urticaria, unspecified   . Hyperlipidemia      Surgical History:  Past Surgical History  Procedure Laterality Date  . Appendectomy  1948  . Cardiac catheterization  11/17/2010    [facility]- LAD min irregs, LCX min irregs, RCA 40p, EF 60%.      Prescriptions prior to admission  Medication Sig Dispense Refill  . aspirin EC 81 MG tablet Take 81 mg by mouth at bedtime.      . cetirizine (ZYRTEC) 10 MG tablet Take 10 mg by mouth daily.      . Cholecalciferol (VITAMIN D PO) Take 2 tablets by mouth daily.      . enalapril (VASOTEC) 10 MG tablet Take 5 mg by mouth daily.       . ferrous sulfate 325 (65 FE) MG tablet Take 325 mg by mouth daily with breakfast.      . fluticasone (FLONASE) 50 MCG/ACT nasal spray Place 2 sprays into both nostrils daily.      . Multiple Vitamin (MULTIVITAMIN) tablet Take 1 tablet by mouth daily.      . Omega-3 Fatty Acids (FISH OIL) 1000 MG CAPS  Take 1,200 mg by mouth 2 (two) times daily.       . Skin Protectants, Misc. (EUCERIN) cream Apply 1 application topically as needed for dry skin.      Marland Kitchen vitamin C (ASCORBIC ACID) 500 MG tablet Take 500 mg by mouth daily.        Inpatient Medications:  . aspirin  81 mg Oral QHS  . enalapril  5 mg Oral Daily  . ferrous sulfate  325 mg Oral Q breakfast  . fluticasone  2 spray Each Nare QHS  . gentamicin irrigation  80 mg Irrigation On Call  . loratadine  10 mg Oral Daily  . pantoprazole  40 mg Oral Q0600  . vancomycin  1,000 mg Intravenous On Call    Allergies:  Allergies  Allergen Reactions  . Keflex [Cephalexin]     Diarrhea   . Penicillins Hives and Rash      History   Social History  . Marital Status: Married    Spouse Name: N/A    Number of Children: N/A  . Years of Education: N/A   Occupational History  . accountant     retired   Social History Main Topics  . Smoking status: Never Smoker   . Smokeless tobacco: Not on file  . Alcohol Use: No  . Drug Use: No  . Sexual Activity: Not on file   Other Topics Concern  . Not on file   Social History Narrative  . No narrative on file     Family History  Problem Relation Age of Onset  . Emphysema Father   . Coronary artery disease Mother     BP 184/71  Pulse 79  Temp(Src) 98 F (36.7 C) (Oral)  Resp 13  Ht 5\' 7"  (1.702 m)  Wt 173 lb 4.5 oz (78.6 kg)  BMI 27.13 kg/m2  SpO2 97% Tele- sinus with intermittent mobitz II second degree AV block Physical Exam: Filed Vitals:   12/05/13 0700 12/05/13 0800 12/05/13 0900 12/05/13 0924  BP: 212/87 184/71 190/78 190/78  Pulse: 86 79 79   Temp:  98 F (36.7 C)    TempSrc:  Oral    Resp: 14 13 12    Height:      Weight:      SpO2: 97% 97% 96%     GEN- The patient is elderly appearing, alert and oriented x 3 today.   Head- normocephalic, atraumatic Eyes-  Sclera clear, conjunctiva pink Ears- hearing intact Oropharynx- clear Neck- supple, Lungs- Clear to ausculation bilaterally, normal work of breathing Heart- Regular rate and rhythm  GI- soft, NT, ND, + BS Extremities- no clubbing, cyanosis, or edema  MS- age appropriate muscle atrophy Skin- no rash or lesion Psych- euthymic mood, full affect Neuro- strength and sensation are intact    Labs:   Lab Results  Component Value Date   WBC 6.2 12/05/2013   HGB 13.0 12/05/2013   HCT 38.2* 12/05/2013   MCV 91.0 12/05/2013   PLT 113* 12/05/2013     Recent Labs Lab 12/05/13 0324  NA 142  K 4.5  CL 107  CO2 21  BUN 21  CREATININE 1.24  CALCIUM 9.2  GLUCOSE 111*     Radiology/Studies: No results found.  QMG:QQPYP rhythm, 1st degree AV block (PR 226),  intermittent 2:1 heart block, RBBB, LAFB, QRS 132  TELEMETRY: sinus bradycardia, intermittent 2:1 heart block, intermittent Mobitz I  A/P 1. Mobitz II second degree AV block The patient has symptomatic mobitz II second  degree AV block with transient complete heart block and syncope.  No reversible causes have been found.  I would therefore recommend pacemaker implantation at this time.  Risks, benefits, alternatives to pacemaker implantation were discussed in detail with the patient today. The patient understands that the risks include but are not limited to bleeding, infection, pneumothorax, perforation, tamponade, vascular damage, renal failure, MI, stroke, death,  and lead dislodgement and wishes to proceed at this time.

## 2013-12-05 NOTE — Care Management Note (Signed)
    Page 1 of 1   12/05/2013     9:44:52 AM CARE MANAGEMENT NOTE 12/05/2013  Patient:  JAMELLE, GOLDSTON   Account Number:  [id number]  Date Initiated:  12/05/2013  Documentation initiated by:  Elissa Hefty  Subjective/Objective Assessment:   adm w bradycardia     Action/Plan:   lives w wife, pcp dr Larene Beach   Anticipated DC Date:     Anticipated DC Plan:           Choice offered to / List presented to:             Status of service:   Medicare Important Message given?   (If response is "NO", the following Medicare IM given date fields will be blank) Date Medicare IM given:   Medicare IM given by:   Date Additional Medicare IM given:   Additional Medicare IM given by:    Discharge Disposition:    Per UR Regulation:  Reviewed for med. necessity/level of care/duration of stay  If discussed at [place] of Stay Meetings, dates discussed:    Comments:

## 2013-12-05 NOTE — Progress Notes (Signed)
Orthopedic Tech Progress Note Patient Details:  OSMAR HOWTON November 01, 1927 [id number]  Ortho Devices Type of Ortho Device: Arm sling Ortho Device/Splint Interventions: Application   Cammer, Theodoro Parma 12/05/2013, 2:35 PM

## 2013-12-05 NOTE — H&P (View-Only) (Signed)
ELECTROPHYSIOLOGY CONSULT NOTE    Patient ID: Clinton Joseph MRN: [id number], DOB/AGE: 09/18/1927 80 y.o.  Admit date: 12/04/2013 Date of Consult: 12-05-13  Primary Physician: Coralie Common, MD Primary Cardiologist: Fletcher Anon  Reason for Consultation: syncope and heart block  HPI:  Clinton Joseph is a 80 y.o. male with a past medical history significant for lymphoma, hypertension, and hyperlipidemia with minimally obstructive CAD by cath 2012.  He has had previous EP evaluation at [facility] in 2012 at which time there was concern for VT in the setting of tremors.  Strips were reviewed and felt to be artifact and in the setting of normal cath and normal LV function, further work up was not recommended.  He has done well since that time until a few months ago when he began to have intermittent dizziness and weakness that was mostly when standing.  His enalapril dose was decreased and his anti-depression medication (family cannot remember name) was discontinued.  He felt that he was improved but then developed recurrent intermittent symptoms several times per week.  On the day of admission, he was driving to church when he developed abrupt loss of consciousness with no prodrome.  His wife was able to steer the [place] to park.  He regained consciousness after about a minute and was able to finish parking the [place].  He continued on to church but had persistent weakness.  After lunch and with protracted symptoms, he presented to [facility] for further evaluation.  He was found to be bradycardic with rates in the 40's, periods of CHB and pauses of >6 seconds.  He was transferred to [facility] for further evaluation.  Lab work at [facility] was notable for normal TSH and electrolytes. He has been on no AV nodal blocking agents.  Echocardiogram yesterday demonstrated EF 55-60%, no RWMA, mildly dilated LA, mild to moderate aortic valve sclerosis without stenosis.   Prior to admission, he denies chest pain, shortness of breath,  LE edema, focal neurological defects, recent fevers, chills, nausea or vomiting.  ROS is otherwise negative except as outlined above.  EP has been asked to evaluate for treatment options.   Past Medical History  Diagnosis Date  . Unspecified essential hypertension   . Anxiety state, unspecified   . Diffuse large B cell lymphoma   . Tremor     right hand  . Urticaria, unspecified   . Hyperlipidemia      Surgical History:  Past Surgical History  Procedure Laterality Date  . Appendectomy  1948  . Cardiac catheterization  11/17/2010    [facility]- LAD min irregs, LCX min irregs, RCA 40p, EF 60%.      Prescriptions prior to admission  Medication Sig Dispense Refill  . aspirin EC 81 MG tablet Take 81 mg by mouth at bedtime.      . cetirizine (ZYRTEC) 10 MG tablet Take 10 mg by mouth daily.      . Cholecalciferol (VITAMIN D PO) Take 2 tablets by mouth daily.      . enalapril (VASOTEC) 10 MG tablet Take 5 mg by mouth daily.       . ferrous sulfate 325 (65 FE) MG tablet Take 325 mg by mouth daily with breakfast.      . fluticasone (FLONASE) 50 MCG/ACT nasal spray Place 2 sprays into both nostrils daily.      . Multiple Vitamin (MULTIVITAMIN) tablet Take 1 tablet by mouth daily.      . Omega-3 Fatty Acids (FISH OIL) 1000 MG CAPS  Take 1,200 mg by mouth 2 (two) times daily.       . Skin Protectants, Misc. (EUCERIN) cream Apply 1 application topically as needed for dry skin.      Marland Kitchen vitamin C (ASCORBIC ACID) 500 MG tablet Take 500 mg by mouth daily.        Inpatient Medications:  . aspirin  81 mg Oral QHS  . enalapril  5 mg Oral Daily  . ferrous sulfate  325 mg Oral Q breakfast  . fluticasone  2 spray Each Nare QHS  . gentamicin irrigation  80 mg Irrigation On Call  . loratadine  10 mg Oral Daily  . pantoprazole  40 mg Oral Q0600  . vancomycin  1,000 mg Intravenous On Call    Allergies:  Allergies  Allergen Reactions  . Keflex [Cephalexin]     Diarrhea   . Penicillins Hives and Rash      History   Social History  . Marital Status: Married    Spouse Name: N/A    Number of Children: N/A  . Years of Education: N/A   Occupational History  . accountant     retired   Social History Main Topics  . Smoking status: Never Smoker   . Smokeless tobacco: Not on file  . Alcohol Use: No  . Drug Use: No  . Sexual Activity: Not on file   Other Topics Concern  . Not on file   Social History Narrative  . No narrative on file     Family History  Problem Relation Age of Onset  . Emphysema Father   . Coronary artery disease Mother     BP 184/71  Pulse 79  Temp(Src) 98 F (36.7 C) (Oral)  Resp 13  Ht 5\' 7"  (1.702 m)  Wt 173 lb 4.5 oz (78.6 kg)  BMI 27.13 kg/m2  SpO2 97% Tele- sinus with intermittent mobitz II second degree AV block Physical Exam: Filed Vitals:   12/05/13 0700 12/05/13 0800 12/05/13 0900 12/05/13 0924  BP: 212/87 184/71 190/78 190/78  Pulse: 86 79 79   Temp:  98 F (36.7 C)    TempSrc:  Oral    Resp: 14 13 12    Height:      Weight:      SpO2: 97% 97% 96%     GEN- The patient is elderly appearing, alert and oriented x 3 today.   Head- normocephalic, atraumatic Eyes-  Sclera clear, conjunctiva pink Ears- hearing intact Oropharynx- clear Neck- supple, Lungs- Clear to ausculation bilaterally, normal work of breathing Heart- Regular rate and rhythm  GI- soft, NT, ND, + BS Extremities- no clubbing, cyanosis, or edema  MS- age appropriate muscle atrophy Skin- no rash or lesion Psych- euthymic mood, full affect Neuro- strength and sensation are intact    Labs:   Lab Results  Component Value Date   WBC 6.2 12/05/2013   HGB 13.0 12/05/2013   HCT 38.2* 12/05/2013   MCV 91.0 12/05/2013   PLT 113* 12/05/2013     Recent Labs Lab 12/05/13 0324  NA 142  K 4.5  CL 107  CO2 21  BUN 21  CREATININE 1.24  CALCIUM 9.2  GLUCOSE 111*     Radiology/Studies: No results found.  ZSW:FUXNA rhythm, 1st degree AV block (PR 226),  intermittent 2:1 heart block, RBBB, LAFB, QRS 132  TELEMETRY: sinus bradycardia, intermittent 2:1 heart block, intermittent Mobitz I  A/P 1. Mobitz II second degree AV block The patient has symptomatic mobitz II second  degree AV block with transient complete heart block and syncope.  No reversible causes have been found.  I would therefore recommend pacemaker implantation at this time.  Risks, benefits, alternatives to pacemaker implantation were discussed in detail with the patient today. The patient understands that the risks include but are not limited to bleeding, infection, pneumothorax, perforation, tamponade, vascular damage, renal failure, MI, stroke, death,  and lead dislodgement and wishes to proceed at this time.

## 2013-12-05 NOTE — Op Note (Signed)
SURGEON:  [name], MD     PREPROCEDURE DIAGNOSIS:  Symptomatic mobitz II second degree AV block with syncope    POSTPROCEDURE DIAGNOSIS:  Symptomatic mobitz II second degree AV block with syncope     PROCEDURES:   1.  Pacemaker implantation.     INTRODUCTION:  Clinton Joseph is a 80 y.o. male with a history of mobitz II second degree AV block and syncope who presents today for pacemaker implantation.  No reversible causes have been identified.  The patient therefore presents today for pacemaker implantation.     DESCRIPTION OF PROCEDURE:  Informed written consent was obtained, and  the patient was brought to the electrophysiology lab in a fasting state.  The patient required no sedation for the procedure today.  The patients left chest was prepped and draped in the usual sterile fashion by the EP lab staff. The skin overlying the left deltopectoral region was infiltrated with lidocaine for local analgesia.  A 4-cm incision was made over the left deltopectoral region.  A left subcutaneous pacemaker pocket was fashioned using a combination of sharp and blunt dissection. Electrocautery was required to assure hemostasis.    RA/RV Lead Placement: The left axillary vein was therefore cannulated.  Through the left axillary vein, a [facility] model [phone] (serial number  [id number]) right atrial lead and a [facility] model [phone] (serial number  [id number]) right ventricular lead were advanced with fluoroscopic visualization into the right atrial appendage and right ventricular apex positions respectively.  Initial atrial lead P- waves measured 2 mV with impedance of 510 ohms and a threshold of 1.4 V at 0.5 msec.  Right ventricular lead R-waves measured 9 mV with an impedance of 933 ohms and a threshold of 0.6 V at 0.5 msec.  Both leads were secured to the pectoralis fascia using #2-0 silk over the suture sleeves.   Device Placement:  The leads were then connected to a [place] DR  model [id number] (serial number  [id number]) pacemaker.  The pocket was irrigated with copious gentamicin solution.  The pacemaker was then placed into the pocket.  The pocket was then closed in 2 layers with 2.0 Vicryl suture for the subcutaneous and subcuticular layers.  Steri-  Strips and a sterile dressing were then applied.  There were no early apparent complications.     CONCLUSIONS:   1. Successful implantation of a [employer] Assurity DR  dual-chamber pacemaker for symptomatic mobitz II second degree AV block with syncope  2. No early apparent complications.           [name], MD 12/05/2013 1:35 PM

## 2013-12-05 NOTE — Interval H&P Note (Signed)
History and Physical Interval Note:  12/05/2013 11:43 AM  Clinton Joseph  has presented today for surgery, with the diagnosis of syncope  The various methods of treatment have been discussed with the patient and family. After consideration of risks, benefits and other options for treatment, the patient has consented to  Procedure(s): PERMANENT PACEMAKER INSERTION (N/A) as a surgical intervention .  The patient's history has been reviewed, patient examined, no change in status, stable for surgery.  I have reviewed the patient's chart and labs.  Questions were answered to the patient's satisfaction.     [name]

## 2013-12-06 ENCOUNTER — Inpatient Hospital Stay (HOSPITAL_COMMUNITY): Payer: Medicare Other

## 2013-12-06 NOTE — Discharge Instructions (Signed)
Biventricular Pacemaker Implantation A pacemaker is a small, lightweight, battery-powered device that is placed (implanted) under the skin in the upper chest. Your health care provider may prescribe a pacemaker for you if your heartbeat is too slow (bradycardia). A biventricular pacemaker is a pulse generator connected by wires called leads that go into the two lower chambers on the right and left sides of your heart (ventricles). It is used to treat symptoms of heart failure. The pulse generator is a small computer run by a battery. The generator creates a regular electronic pulse. The pulse is sent through the leads, which go through a blood vessel and into the ventricles of your heart. This type of pacemaker makes a weak heart more efficient. LET [facility] CARE PROVIDER KNOW ABOUT:  Any allergies. Some allergies can cause serious problems during the procedure. Allergies to shellfish or agents, such as iodine, used in liquids that enhance specific areas of your body on X-ray images (contrast dyes) are especially problematic.   All medicines you take. These include vitamins, herbs, eye drops, over-the-counter medicines, and creams.   Use of steroids.   Problems with numbing medicines (anesthetics).  Bleeding problems.   Past surgeries.   Other health problems. RISKS AND COMPLICATIONS Implanting a biventricular pacemaker is usually a safe procedure but problems can occur. For example:   Too much bleeding may occur.   Infection may develop.   Blood vessels, your lungs, or your heart may be harmed.   The pacemaker may not make your condition better. BEFORE THE PROCEDURE   You may need to have blood tests, heart tests, or a chest X-ray done before the day of the procedure.   Ask your health care provider about changing or stopping your regular medicines.   Make plans to have someone drive you home.  Stop smoking at least 24 hours before the procedure.  Take a bath or  shower the night before the procedure. You may need to scrub your chest with a special type of soap.  Do not eat or drink anything after midnight the night before your procedure. Ask if it is okay to take any needed medicine with a small sip of water. PROCEDURE The procedure to put a pacemaker in your chest is usually done at a hospital in a room that has a large X-ray machine called a fluoroscope. The machine will be above you during the procedure. It will help your health care provider see your heart during the procedure. Before the procedure:   Small monitors will be put on your body. They will be used to check your heart, blood pressure, and oxygen level.  A needle will be put into a vein in your hand or arm. This is called an intravenous (IV) access tube. Fluids and medicine will flow directly into your body through the IV tube.  Your chest will be cleaned with a germ-killing (antiseptic) solution. Your chest may be shaved.  You may be given medicine to help you relax (sedative).   You will be given a numbing medicine called a local anesthetic. This medicine will make your chest area have no feeling while the pacemaker is implanted. You will be sleepy but awake during the procedure. After you are numb, the procedure will begin. The health care provider will:   Make a small cut (incision). This will make a pocket deep under your skin that will hold the pulse generator.  Guide the leads through a large blood vessel into your heart and attach  them to the heart muscles.  Test the pacemaker.  Close the incision with stitches, glue, or staples. AFTER THE PROCEDURE  You may feel pain. Some pain is normal. It may last a few days.   You may stay in a recovery area until the local anesthetic has worn off. Your blood pressure and pulse will be checked often. You will be taken to a room where your heartbeat will be monitored.  A chest X-ray will be taken. This checks that the pacemaker is in  the right place.  The pacemaker will be checked before you go home. It can be adjusted if that is needed. Document Released: 12/30/2011 Document Revised: 08/21/2013 Document Reviewed: 12/30/2011 [facility] Patient Information 2015 [place], [place]. This information is not intended to replace advice given to you by your health care provider. Make sure you discuss any questions you have with your health care provider.  Syncope Syncope means a person passes out (faints). The person usually wakes up in less than 5 minutes. It is important to seek medical care for syncope. HOME CARE  Have someone stay with you until you feel normal.  Do not drive, use machines, or play sports until your doctor says it is okay.  Keep all doctor visits as told.  Lie down when you feel like you might pass out. Take deep breaths. Wait until you feel normal before standing up.  Drink enough fluids to keep your pee (urine) clear or pale yellow.  If you take blood pressure or heart medicine, get up slowly. Take several minutes to sit and then stand. GET HELP RIGHT AWAY IF:   You have a severe headache.  You have pain in the chest, belly (abdomen), or back.  You are bleeding from the mouth or butt (rectum).  You have black or tarry poop (stool).  You have an irregular or very fast heartbeat.  You have pain with breathing.  You keep passing out, or you have shaking (seizures) when you pass out.  You pass out when sitting or lying down.  You feel confused.  You have trouble walking.  You have severe weakness.  You have vision problems. If you fainted, call your local emergency services (911 in [place] Do not drive yourself to the hospital. MAKE SURE YOU:  Pacemaker Implantation The heart has its own electrical system, or natural pacemaker, to regulate the heartbeat. Sometimes, the natural pacemaker system of the heart fails and causes the heart to beat too slowly. If this happens, a pacemaker can be  surgically placed to help the heart beat at a normal or programmed rate. A pacemaker is a small, battery-powered device that is placed under the skin and is programmed to sense your heartbeats. If your heart rate is lower than the programmed rate, the pacemaker will pace your heart. Parts of a pacemaker include:  Wires or leads. The leads are placed in the heart and transmit electricity to the heart. The leads are connected to the pulse generator.  Pulse generator. The pulse generator contains a computer and a memory system. The pulse generator also produces the electrical signal that triggers the heart to beat. A pacemaker may be placed if:  You have a slow heartbeat (bradycardia).  You have fainting (syncope).  Shortness of breath (dyspnea) due to heart problems. LET [facility] CARE PROVIDER KNOW ABOUT:  Any allergies you may have.  All medicines you are taking, including vitamins, herbs, eye drops, creams, and over-the-counter medicines.  Previous problems you or members  of your family have had with the use of anesthetics.  Any blood disorders you have.  Previous surgeries you have had.  Medical conditions you have.  Possibility of pregnancy, if this applies. RISKS AND COMPLICATIONS Generally, pacemaker implantation is a safe procedure. However, problems can occur and include:  Bleeding.  Unable to place the pacemaker under local sedation.  Infection. BEFORE THE PROCEDURE  You will have blood work drawn before the procedure.  Do not use any tobacco products including cigarettes, chewing tobacco, or electronic cigarettes. If you need help quitting, ask your health care provider.  Do not eat or drink anything after midnight on the night before the procedure or as directed by your health care provider.  Ask your health care provider about:  Changing or stopping your regular medicines. This is especially important if you are taking diabetes medicines or blood  thinners.  Taking medicines such as aspirin and ibuprofen. These medicines can thin your blood. Do not take these medicines before your procedure if your health care provider asks you not to.  Ask your health care provider if you can take a sip of water with any approved medicines the morning of the procedure. PROCEDURE  The surgery to place a pacemaker is considered a minimally invasive surgical procedure. It is done under a local anesthetic, which is an injection at the incision site that makes the skin numb. You are also given sedation and pain medicine that makes you drowsy during the procedure.   An intravenous line (IV) will be started in your hand or arm so sedation and pain medicine can be given during the pacemaker procedure.  A numbing medicine will be injected into the skin where the pacemaker is to be placed. A small incision will then be made into the skin. The pacemaker is usually placed under the skin near the collarbone.  After the incision has been made, the leads will be inserted into a large vein and guided into the heart using X-ray.  Using the same incision that was used to place the leads, a small pocket will be created under the skin to hold the pulse generator. The leads will then be connected to the pulse generator.  The incision site will then be closed. A bandage (dressing) is placed over the pacemaker site. The dressing is removed 24-48 hours afterward. AFTER THE PROCEDURE  You will be taken to a recovery area after the pacemaker implant. Your vital signs such as blood pressure, heart rate, breathing, and oxygen levels will be monitored.  A chest X-ray will be done after the pacemaker has been implanted. This is to make sure the pacemaker and leads are in the correct place. Document Released: 03/27/2002 Document Revised: 08/21/2013 Document Reviewed: 08/11/2011 [facility] Patient Information 2015 [place], [place]. This information is not intended to replace advice  given to you by your health care provider. Make sure you discuss any questions you have with your health care provider.   Understand these instructions.  Will watch your condition.  Will get help right away if you are not doing well or get worse. Document Released: 09/23/2007 Document Revised: 10/06/2011 Document Reviewed: 06/05/2011 [facility] Patient Information 2015 [place], [place]. This information is not intended to replace advice given to you by your health care provider. Make sure you discuss any questions you have with your health care provider.

## 2013-12-19 ENCOUNTER — Encounter: Payer: Medicare Other | Admitting: Internal Medicine

## 2013-12-22 ENCOUNTER — Ambulatory Visit (INDEPENDENT_AMBULATORY_CARE_PROVIDER_SITE_OTHER): Payer: Medicare Other | Admitting: *Deleted

## 2013-12-22 ENCOUNTER — Encounter: Payer: Medicare Other | Admitting: Internal Medicine

## 2013-12-22 DIAGNOSIS — Z950 Presence of cardiac pacemaker: Secondary | ICD-10-CM

## 2013-12-22 DIAGNOSIS — I441 Atrioventricular block, second degree: Secondary | ICD-10-CM

## 2013-12-22 LAB — MDC_IDC_ENUM_SESS_TYPE_INCLINIC
Battery Remaining Longevity: 81.6 mo
Battery Voltage: 3.11 V
Brady Statistic RV Percent Paced: 99.98 %
Date Time Interrogation Session: 20150904163330
Implantable Pulse Generator Model: 2240
Implantable Pulse Generator Serial Number: 7664739
Lead Channel Impedance Value: 787.5 Ohm
Lead Channel Pacing Threshold Amplitude: 0.5 V
Lead Channel Pacing Threshold Amplitude: 0.5 V
Lead Channel Pacing Threshold Amplitude: 0.5 V
Lead Channel Pacing Threshold Pulse Width: 0.5 ms
Lead Channel Pacing Threshold Pulse Width: 0.5 ms
Lead Channel Setting Pacing Amplitude: 3.5 V
Lead Channel Setting Pacing Pulse Width: 0.5 ms
Lead Channel Setting Sensing Sensitivity: 2 mV
MDC IDC MSMT LEADCHNL RA IMPEDANCE VALUE: 437.5 Ohm
MDC IDC MSMT LEADCHNL RA SENSING INTR AMPL: 5 mV
MDC IDC MSMT LEADCHNL RV PACING THRESHOLD AMPLITUDE: 0.5 V
MDC IDC MSMT LEADCHNL RV PACING THRESHOLD PULSEWIDTH: 0.5 ms
MDC IDC MSMT LEADCHNL RV PACING THRESHOLD PULSEWIDTH: 0.5 ms
MDC IDC MSMT LEADCHNL RV SENSING INTR AMPL: 12 mV
MDC IDC SET LEADCHNL RA PACING AMPLITUDE: 3.5 V
MDC IDC STAT BRADY RA PERCENT PACED: 5.6 %

## 2013-12-22 NOTE — Progress Notes (Signed)
Wound check appointment. Steri-strips removed. Wound without redness or edema. Incision edges approximated, wound well healed. Normal device function. Thresholds, sensing, and impedances consistent with implant measurements. Device programmed at 3.5V for extra safety margin until 3 month visit. Histogram distribution appropriate for patient and level of activity. 11 mode switches--- <1%, longest 8sec. No high ventricular rates noted. Patient educated about wound care, arm mobility, lifting restrictions. ROV w/ Dr. Lorrin Goodell 03/13/14.

## 2014-01-04 ENCOUNTER — Telehealth: Payer: Self-pay | Admitting: Internal Medicine

## 2014-01-04 NOTE — Telephone Encounter (Signed)
Pt needs clearance for him to even see a dentist. It would be his yearly visit with them.

## 2014-01-04 NOTE — Telephone Encounter (Signed)
Spoke w/ pt.  He states that his dentist will not schedule his annual cleaning w/o permission from our office, as pt recently had a pacer implanted.  Advised pt to have his dentist fax [place] a request and call back if they cannot do this.  Pt verbalizes understanding and will call back if we can be of further assistance.

## 2014-01-05 ENCOUNTER — Telehealth: Payer: Self-pay | Admitting: *Deleted

## 2014-01-05 NOTE — Telephone Encounter (Signed)
Patient is due for routine cleaning at Dr. Ruthy Dick. Does he need to pre medicate on any other instructions. Please fax note [phone]. She will wait to schedule his appt until they receive a letter from [place].

## 2014-01-09 NOTE — Telephone Encounter (Signed)
1) Current guidelines there is no need for prophylactic antibiotic therapy. 2) Given his device was placed August 2015 it would be best for him to wait a few months prior to a routine dental procedure as this will allow for the device to endothelialize.

## 2014-01-10 ENCOUNTER — Encounter: Payer: Self-pay | Admitting: *Deleted

## 2014-01-10 NOTE — Telephone Encounter (Signed)
[place] response faxed as requested

## 2014-01-24 ENCOUNTER — Encounter: Payer: Self-pay | Admitting: Internal Medicine

## 2014-03-13 ENCOUNTER — Ambulatory Visit (INDEPENDENT_AMBULATORY_CARE_PROVIDER_SITE_OTHER): Payer: Medicare Other | Admitting: Internal Medicine

## 2014-03-13 ENCOUNTER — Encounter: Payer: Self-pay | Admitting: Internal Medicine

## 2014-03-13 VITALS — BP 150/78 | HR 79 | Ht 68.0 in | Wt 182.0 lb

## 2014-03-13 DIAGNOSIS — I442 Atrioventricular block, complete: Secondary | ICD-10-CM

## 2014-03-13 DIAGNOSIS — Z7982 Long term (current) use of aspirin: Secondary | ICD-10-CM

## 2014-03-13 DIAGNOSIS — I480 Paroxysmal atrial fibrillation: Secondary | ICD-10-CM

## 2014-03-13 DIAGNOSIS — I10 Essential (primary) hypertension: Secondary | ICD-10-CM

## 2014-03-13 DIAGNOSIS — I441 Atrioventricular block, second degree: Secondary | ICD-10-CM

## 2014-03-13 DIAGNOSIS — Z45018 Encounter for adjustment and management of other part of cardiac pacemaker: Secondary | ICD-10-CM

## 2014-03-13 LAB — MDC_IDC_ENUM_SESS_TYPE_INCLINIC
Battery Voltage: 3.01 V
Brady Statistic RA Percent Paced: 11 %
Implantable Pulse Generator Model: 2240
Implantable Pulse Generator Serial Number: 7664739
Lead Channel Impedance Value: 437.5 Ohm
Lead Channel Impedance Value: 800 Ohm
Lead Channel Pacing Threshold Pulse Width: 0.5 ms
Lead Channel Pacing Threshold Pulse Width: 0.5 ms
Lead Channel Sensing Intrinsic Amplitude: 5 mV
Lead Channel Setting Pacing Amplitude: 2 V
Lead Channel Setting Pacing Pulse Width: 0.5 ms
MDC IDC MSMT BATTERY REMAINING LONGEVITY: 127.2 mo
MDC IDC MSMT LEADCHNL RA PACING THRESHOLD AMPLITUDE: 0.5 V
MDC IDC MSMT LEADCHNL RA PACING THRESHOLD AMPLITUDE: 0.5 V
MDC IDC MSMT LEADCHNL RA PACING THRESHOLD PULSEWIDTH: 0.5 ms
MDC IDC MSMT LEADCHNL RV PACING THRESHOLD AMPLITUDE: 0.375 V
MDC IDC SESS DTM: 20151124092821
MDC IDC SET LEADCHNL RV PACING AMPLITUDE: 0.625
MDC IDC SET LEADCHNL RV SENSING SENSITIVITY: 2 mV
MDC IDC STAT BRADY RV PERCENT PACED: 99.98 %

## 2014-03-13 NOTE — Progress Notes (Signed)
Patient Care Team: Dicky Doe, MD as PCP - General (Family Medicine)   HPI  Clinton Joseph is a 80 y.o. male Seen in follow-up for pacemaker implanted 8/15 by Dr. Greggory Brandy for syncope and Mobitz 2 heart block and intermittent complete heart block and pauses of greater than 6 seconds  He has mildly obstructive coronary disease by cath 2012. Echocardiogram 8/15 demonstrated EF of 55-60% with aortic sclerosis without stenosis.  Past Medical History  Diagnosis Date  . Unspecified essential hypertension   . Anxiety state, unspecified   . Tremor     right hand  . Urticaria, unspecified   . Hyperlipidemia   . Pacemaker   . Diffuse large B cell lymphoma 2007    "had chemo treatments"  . Arthritis     "in hands a little" (12/05/2013)    Past Surgical History  Procedure Laterality Date  . Insert / replace / remove pacemaker  12/05/2013  . Appendectomy  1948  . Cardiac catheterization  11/17/2010    [facility]- LAD min irregs, LCX min irregs, RCA 40p, EF 60%.   . Cataract extraction, bilateral Bilateral ~ 2012    Current Outpatient Prescriptions  Medication Sig Dispense Refill  . aspirin EC 81 MG tablet Take 81 mg by mouth at bedtime.    . cetirizine (ZYRTEC) 10 MG tablet Take 10 mg by mouth daily.    . Cholecalciferol (VITAMIN D Joseph) Take 2 tablets by mouth daily.    . enalapril (VASOTEC) 10 MG tablet Take 10 mg by mouth daily.     . ferrous sulfate 325 (65 FE) MG tablet Take 325 mg by mouth daily with breakfast.    . fluticasone (FLONASE) 50 MCG/ACT nasal spray Place 2 sprays into both nostrils as needed.     . Multiple Vitamin (MULTIVITAMIN) tablet Take 1 tablet by mouth daily.    . Omega-3 Fatty Acids (FISH OIL) 1000 MG CAPS Take 1,200 mg by mouth 2 (two) times daily.     . Skin Protectants, Misc. (EUCERIN) cream Apply 1 application topically as needed for dry skin.    Marland Kitchen vitamin C (ASCORBIC ACID) 500 MG tablet Take 500 mg by mouth daily.     No current  facility-administered medications for this visit.    Allergies  Allergen Reactions  . Keflex [Cephalexin]     Diarrhea   . Tape   . Penicillins Hives and Rash    Review of Systems negative except from HPI and PMH  Physical Exam BP 150/78 mmHg  Pulse 79  Ht 5\' 8"  (1.727 m)  Wt 82.555 kg (182 lb)  BMI 27.68 kg/m2 Well developed and well nourished in no acute distress HENT normal E scleral and icterus clear Neck Supple JVP flat; carotids brisk and full Clear to ausculation Device pocket well healed; without hematoma or erythema.  There is no tethering Regular rate and rhythm, no murmurs gallops or rub Soft with active bowel sounds No clubbing cyanosis  Edema Alert and oriented, grossly normal motor and sensory function Skin Warm and Dry    Assessment and  Plan  Complete heart block  Pacemaker-St. Jude The patient's device was interrogated and the information was fully reviewed.  The device was reprogrammed to  Maximize longevity  Syncope  No recurrent syncope  Hypertension  Atrial tachycardia  The patient has become device dependent. His device was reprogrammed to maximize longevity  There has been no recurrent syncope  Blood pressure remains modestly elevated;  He will follow this up with his PCP  We had a lengthy discussion regarding the role of aspirin; he has no prior known history of heart disease. Based on the [place]-atrial fibrillation Registry data we will plan to stop the aspirin at this juncture as there is no evidence of benefit in the atrial fibrillation cohort as well as general cohort without coronary or vascular disease.   We will need to follow up the episodes of atrial tachycardia/fibrillation detected by his device. None is longer than one hour. Many are much shorter than. Heart rates are in the 180--220 range or so. This is not a clear indication at this point for anticoagulation although I didn't approach the topic.

## 2014-03-13 NOTE — Patient Instructions (Signed)
Your physician has recommended you make the following change in your medication:  Stop Aspirin   Your physician wants you to follow-up in: 9 months with Dr. Caryl Comes. You will receive a reminder letter in the mail two months in advance. If you don't receive a letter, please call our office to schedule the follow-up appointment.  Remote monitoring is used to monitor your Pacemaker of ICD from home. This monitoring reduces the number of office visits required to check your device to one time per year. It allows [place] to keep an eye on the functioning of your device to ensure it is working properly. You are scheduled for a device check from home on 06/14/14. You may send your transmission at any time that day. If you have a wireless device, the transmission will be sent automatically. After your physician reviews your transmission, you will receive a postcard with your next transmission date.

## 2014-03-29 ENCOUNTER — Encounter (HOSPITAL_COMMUNITY): Payer: Self-pay | Admitting: Internal Medicine

## 2014-05-25 ENCOUNTER — Encounter: Payer: Self-pay | Admitting: Internal Medicine

## 2014-05-28 DIAGNOSIS — H6122 Impacted cerumen, left ear: Secondary | ICD-10-CM | POA: Diagnosis not present

## 2014-06-14 ENCOUNTER — Ambulatory Visit (INDEPENDENT_AMBULATORY_CARE_PROVIDER_SITE_OTHER): Payer: PRIVATE HEALTH INSURANCE | Admitting: *Deleted

## 2014-06-14 DIAGNOSIS — I441 Atrioventricular block, second degree: Secondary | ICD-10-CM

## 2014-06-14 LAB — MDC_IDC_ENUM_SESS_TYPE_REMOTE
Battery Remaining Longevity: 122 mo
Brady Statistic AP VS Percent: 1 %
Brady Statistic AS VS Percent: 1 %
Brady Statistic RA Percent Paced: 9.5 %
Date Time Interrogation Session: 20160225070025
Implantable Pulse Generator Model: 2240
Lead Channel Impedance Value: 410 Ohm
Lead Channel Pacing Threshold Amplitude: 0.5 V
Lead Channel Pacing Threshold Amplitude: 0.5 V
Lead Channel Pacing Threshold Pulse Width: 0.5 ms
Lead Channel Pacing Threshold Pulse Width: 0.5 ms
Lead Channel Sensing Intrinsic Amplitude: 10.4 mV
Lead Channel Sensing Intrinsic Amplitude: 5 mV
Lead Channel Setting Pacing Amplitude: 0.75 V
Lead Channel Setting Pacing Pulse Width: 0.5 ms
MDC IDC MSMT BATTERY REMAINING PERCENTAGE: 95.5 %
MDC IDC MSMT BATTERY VOLTAGE: 3.02 V
MDC IDC MSMT LEADCHNL RV IMPEDANCE VALUE: 730 Ohm
MDC IDC PG SERIAL: 7664739
MDC IDC SET LEADCHNL RA PACING AMPLITUDE: 2 V
MDC IDC SET LEADCHNL RV SENSING SENSITIVITY: 2 mV
MDC IDC STAT BRADY AP VP PERCENT: 9.7 %
MDC IDC STAT BRADY AS VP PERCENT: 90 %
MDC IDC STAT BRADY RV PERCENT PACED: 99 %

## 2014-06-14 NOTE — Progress Notes (Signed)
Remote pacemaker transmission.   

## 2014-06-27 ENCOUNTER — Encounter: Payer: Self-pay | Admitting: *Deleted

## 2014-07-04 ENCOUNTER — Encounter: Payer: Self-pay | Admitting: Internal Medicine

## 2014-07-10 DIAGNOSIS — C8590 Non-Hodgkin lymphoma, unspecified, unspecified site: Secondary | ICD-10-CM | POA: Diagnosis not present

## 2014-07-10 DIAGNOSIS — I10 Essential (primary) hypertension: Secondary | ICD-10-CM | POA: Diagnosis not present

## 2014-07-10 DIAGNOSIS — I442 Atrioventricular block, complete: Secondary | ICD-10-CM | POA: Diagnosis not present

## 2014-07-10 DIAGNOSIS — F419 Anxiety disorder, unspecified: Secondary | ICD-10-CM | POA: Diagnosis not present

## 2014-07-10 DIAGNOSIS — R7309 Other abnormal glucose: Secondary | ICD-10-CM | POA: Diagnosis not present

## 2014-07-20 ENCOUNTER — Ambulatory Visit
Admit: 2014-07-20 | Disposition: A | Discharge status: Home / Self Care | Payer: Self-pay | Attending: Oncology | Admitting: Oncology

## 2014-08-02 DIAGNOSIS — Z79899 Other long term (current) drug therapy: Secondary | ICD-10-CM | POA: Diagnosis not present

## 2014-08-02 DIAGNOSIS — F419 Anxiety disorder, unspecified: Secondary | ICD-10-CM | POA: Diagnosis not present

## 2014-08-02 DIAGNOSIS — C8590 Non-Hodgkin lymphoma, unspecified, unspecified site: Secondary | ICD-10-CM | POA: Diagnosis not present

## 2014-08-02 DIAGNOSIS — Z9049 Acquired absence of other specified parts of digestive tract: Secondary | ICD-10-CM | POA: Diagnosis not present

## 2014-08-02 DIAGNOSIS — I10 Essential (primary) hypertension: Secondary | ICD-10-CM | POA: Diagnosis not present

## 2014-08-02 DIAGNOSIS — Z950 Presence of cardiac pacemaker: Secondary | ICD-10-CM | POA: Diagnosis not present

## 2014-08-02 LAB — CBC CANCER CENTER
Basophil #: 0 x10 3/mm (ref 0.0–0.1)
Basophil %: 0.7 %
EOS PCT: 4.1 %
Eosinophil #: 0.3 x10 3/mm (ref 0.0–0.7)
HCT: 39.1 % — ABNORMAL LOW (ref 40.0–52.0)
HGB: 12.9 g/dL — ABNORMAL LOW (ref 13.0–18.0)
Lymphocyte #: 1.8 x10 3/mm (ref 1.0–3.6)
Lymphocyte %: 27.7 %
MCH: 30.2 pg (ref 26.0–34.0)
MCHC: 32.9 g/dL (ref 32.0–36.0)
MCV: 92 fL (ref 80–100)
MONO ABS: 0.5 x10 3/mm (ref 0.2–1.0)
Monocyte %: 7.2 %
NEUTROS PCT: 60.3 %
Neutrophil #: 3.8 x10 3/mm (ref 1.4–6.5)
PLATELETS: 119 x10 3/mm — AB (ref 150–440)
RBC: 4.27 10*6/uL — AB (ref 4.40–5.90)
RDW: 12.8 % (ref 11.5–14.5)
WBC: 6.3 x10 3/mm (ref 3.8–10.6)

## 2014-08-02 LAB — COMPREHENSIVE METABOLIC PANEL
ANION GAP: 4 — AB (ref 7–16)
Albumin: 4.3 g/dL
Alkaline Phosphatase: 47 U/L
BUN: 21 mg/dL — AB
Bilirubin,Total: 1 mg/dL
CHLORIDE: 101 mmol/L
CREATININE: 1.24 mg/dL
Calcium, Total: 10.2 mg/dL
Co2: 28 mmol/L
EGFR (Non-African Amer.): 53 — ABNORMAL LOW
Glucose: 100 mg/dL — ABNORMAL HIGH
POTASSIUM: 4.3 mmol/L
SGOT(AST): 42 U/L — ABNORMAL HIGH
SGPT (ALT): 39 U/L
Sodium: 133 mmol/L — ABNORMAL LOW
Total Protein: 7.2 g/dL

## 2014-08-02 LAB — LACTATE DEHYDROGENASE: LDH: 163 U/L

## 2014-08-11 NOTE — H&P (Signed)
PATIENT NAME:  Clinton Joseph, Clinton Joseph MR#:  [id number] DATE OF BIRTH:  Nov 02, 1927  DATE OF ADMISSION:  12/03/2013  PRIMARY CARE PHYSICIAN:  Dr.  [name]  ADDENDUM:  ALLERGIES:  PENICILLIN, LETTUCE.  HOME MEDICATIONS: Vitamin E 400 International Units 1 cap once a day; vitamin D3 1000 International Units 1 cap once a day; vitamin C 500 mg p.o. daily; multivitamin 1 tablet p.o. daily; fluticasone nasal 50 mcg 2 sprays to each nostril at bedtime; Vasotec 5 mg p.o. daily; cetirizine 10 mg p.o. daily; aspirin 81 mg p.o. daily.     ____________________________ Demetrios Loll, MD qc:LT D: 12/03/2013 21:04:08 ET T: 12/03/2013 21:20:51 ET JOB#: [id number]  cc: Demetrios Loll, MD, <Dictator> Demetrios Loll MD ELECTRONICALLY SIGNED 12/04/2013 13:36
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
## 2014-08-13 DIAGNOSIS — Z008 Encounter for other general examination: Secondary | ICD-10-CM | POA: Diagnosis not present

## 2014-09-13 ENCOUNTER — Ambulatory Visit (INDEPENDENT_AMBULATORY_CARE_PROVIDER_SITE_OTHER): Payer: Medicare Other | Admitting: *Deleted

## 2014-09-13 DIAGNOSIS — I441 Atrioventricular block, second degree: Secondary | ICD-10-CM | POA: Diagnosis not present

## 2014-09-13 NOTE — Progress Notes (Signed)
Remote pacemaker transmission.   

## 2014-09-14 LAB — CUP PACEART REMOTE DEVICE CHECK
Battery Remaining Longevity: 119 mo
Battery Remaining Percentage: 95.5 %
Battery Voltage: 3.02 V
Brady Statistic AP VP Percent: 9.9 %
Brady Statistic AP VS Percent: 1 %
Brady Statistic AS VP Percent: 90 %
Brady Statistic AS VS Percent: 1 %
Brady Statistic RA Percent Paced: 9.7 %
Brady Statistic RV Percent Paced: 99 %
Lead Channel Impedance Value: 390 Ohm
Lead Channel Impedance Value: 700 Ohm
Lead Channel Pacing Threshold Amplitude: 0.5 V
Lead Channel Pacing Threshold Amplitude: 0.5 V
Lead Channel Pacing Threshold Pulse Width: 0.5 ms
Lead Channel Pacing Threshold Pulse Width: 0.5 ms
Lead Channel Sensing Intrinsic Amplitude: 5 mV
Lead Channel Sensing Intrinsic Amplitude: 9.4 mV
Lead Channel Setting Pacing Amplitude: 2 V
Lead Channel Setting Sensing Sensitivity: 2 mV
MDC IDC SESS DTM: 20160526060014
MDC IDC SET LEADCHNL RV PACING AMPLITUDE: 0.75 V
MDC IDC SET LEADCHNL RV PACING PULSEWIDTH: 0.5 ms
Pulse Gen Serial Number: 7664739

## 2014-09-26 ENCOUNTER — Encounter: Payer: Self-pay | Admitting: Cardiology

## 2014-10-03 ENCOUNTER — Encounter: Payer: Self-pay | Admitting: Internal Medicine

## 2014-10-16 ENCOUNTER — Ambulatory Visit (INDEPENDENT_AMBULATORY_CARE_PROVIDER_SITE_OTHER): Payer: Medicare Other | Admitting: Cardiovascular Disease

## 2014-10-16 ENCOUNTER — Encounter: Payer: Self-pay | Admitting: Cardiovascular Disease

## 2014-10-16 ENCOUNTER — Ambulatory Visit: Payer: Self-pay | Admitting: Cardiovascular Disease

## 2014-10-16 VITALS — BP 130/72 | HR 91 | Ht 68.5 in | Wt 180.2 lb

## 2014-10-16 DIAGNOSIS — I472 Ventricular tachycardia, unspecified: Secondary | ICD-10-CM

## 2014-10-16 DIAGNOSIS — I441 Atrioventricular block, second degree: Secondary | ICD-10-CM

## 2014-10-16 DIAGNOSIS — Z950 Presence of cardiac pacemaker: Secondary | ICD-10-CM

## 2014-10-16 DIAGNOSIS — I10 Essential (primary) hypertension: Secondary | ICD-10-CM

## 2014-10-16 DIAGNOSIS — R000 Tachycardia, unspecified: Secondary | ICD-10-CM | POA: Insufficient documentation

## 2014-10-16 MED ORDER — METOPROLOL SUCCINATE ER 25 MG PO TB24: 25.0000 mg | ORAL_TABLET | Freq: Every day | ORAL | Status: DC

## 2014-10-16 NOTE — Assessment & Plan Note (Signed)
History ofsyncope and Mobitz 2 heart block and intermittent complete heart block and pauses of greater than 6 seconds  pacemaker placed August 2015  Has close follow-up with Dr. Caryl Comes

## 2014-10-16 NOTE — Patient Instructions (Addendum)
You are doing well.  For elevated heart rate, Start metoprolol succinate one a day at night  Please call us if you have new issues that need to be addressed before your next appt.  Your physician wants you to follow-up in: 6 months.  You will receive a reminder letter in the mail two months in advance. If you don't receive a letter, please call our office to schedule the follow-up appointment.

## 2014-10-16 NOTE — Assessment & Plan Note (Signed)
We'll start low-dose metoprolol succinate. If no improvement in his episodes of tachycardia on pacer download , may need monitor to rule out other arrhythmia such as atrial fibrillation  Could titrate up metoprolol succinate as blood pressure will tolerate

## 2014-10-16 NOTE — Progress Notes (Signed)
Patient ID: Clinton Joseph, male    DOB: 1927/12/14, 81 y.o.   MRN: [id number]  HPI Comments: [name] is a very pleasant 81 year old gentleman  Prior history of lymphoma 2006 with chemotherapy x7 cycles at that time. Notes indicating run of VT, sustained with presentation to the emergency room in 2012. Rate was 150 beats per minute lasting more than 3 seconds. He was given IV amiodarone and converted to normal sinus rhythm. Evaluation at that time showing nonobstructive CAD, normal ejection fraction. History of hypertension  last seen by myself in May 2015,  Since then had bradycardia, heart block in August 2015 requiring pacemaker  He presents for follow-up of his arrhythmia  patient of Dr. [name]   In follow-up, he reports that he feels well, denies having any episodes of dizziness  He reports blood pressure has been relatively stable.  previous pacer interrogations have confirmed episodes of tachycardia.  He is unable to appreciate this.   Most recent pacer download again confirms tachycardia sometimes lasting up to one hour  ventricularly paced 99% of the time , right atrial paced 10%  Heart rate today up to the 90s with no improvement at rest  EKG today  shows normal sinus rhythm with rate  91 bpm,  ventricularly paced rhythm   other past medical history Prior cardiac catheterization showed 40% proximal RCA disease, minimal mid left circumflex disease normal ejection fraction at that time,  Prior echocardiogram showing normal LV systolic function, otherwise normal study prior EKGs and 2012 showed normal sinus rhythm with right bundle branch block, left anterior fascicular block   Allergies  Allergen Reactions  . Keflex [Cephalexin]     Diarrhea   . Tape   . Penicillins Hives and Rash    Current Outpatient Prescriptions on File Prior to Visit  Medication Sig Dispense Refill  . cetirizine (ZYRTEC) 10 MG tablet Take 10 mg by mouth daily.    . Cholecalciferol  (VITAMIN D PO) Take 2 tablets by mouth daily.    . enalapril (VASOTEC) 10 MG tablet Take 10 mg by mouth daily.     . ferrous sulfate 325 (65 FE) MG tablet Take 325 mg by mouth daily with breakfast.    . fluticasone (FLONASE) 50 MCG/ACT nasal spray Place 2 sprays into both nostrils as needed.     . Multiple Vitamin (MULTIVITAMIN) tablet Take 1 tablet by mouth daily.    . Omega-3 Fatty Acids (FISH OIL) 1000 MG CAPS Take 1,200 mg by mouth 2 (two) times daily.     . Skin Protectants, Misc. (EUCERIN) cream Apply 1 application topically as needed for dry skin.    Marland Kitchen vitamin C (ASCORBIC ACID) 500 MG tablet Take 500 mg by mouth daily.     No current facility-administered medications on file prior to visit.    Past Medical History  Diagnosis Date  . Unspecified essential hypertension   . Anxiety state, unspecified   . Tremor     right hand  . Urticaria, unspecified   . Hyperlipidemia   . Pacemaker   . Diffuse large B cell lymphoma 2007    "had chemo treatments"  . Arthritis     "in hands a little" (12/05/2013)    Past Surgical History  Procedure Laterality Date  . Insert / replace / remove pacemaker  12/05/2013  . Appendectomy  1948  . Cardiac catheterization  11/17/2010    [facility]- LAD min irregs, LCX min irregs, RCA 40p, EF 60%.   Marland Kitchen  Cataract extraction, bilateral Bilateral ~ 2012  . Permanent pacemaker insertion N/A 12/05/2013    Procedure: PERMANENT PACEMAKER INSERTION;  Surgeon: Coralyn Mark, MD;  Location: [place];  Service: Cardiovascular;  Laterality: N/A;    Social History  reports that he has never smoked. He has never used smokeless tobacco. He reports that he drinks about 1.2 oz of alcohol per week. He reports that he does not use illicit drugs.  Family History family history includes Coronary artery disease in his mother; Emphysema in his father.      Review of Systems  Constitutional: Negative.   Respiratory: Negative.   Cardiovascular: Negative.    Gastrointestinal: Negative.   Musculoskeletal: Negative.   Skin: Negative.   Neurological: Negative.   Hematological: Negative.   Psychiatric/Behavioral: Negative.   All other systems reviewed and are negative.   BP 130/72 mmHg  Pulse 91  Ht 5' 8.5" (1.74 m)  Wt 180 lb 4 oz (81.761 kg)  BMI 27.01 kg/m2  Physical Exam  Constitutional: He is oriented to person, place, and time. He appears well-developed and well-nourished.  HENT:  Head: Normocephalic.  Nose: Nose normal.  Mouth/Throat: Oropharynx is clear and moist.  Eyes: Conjunctivae are normal. Pupils are equal, round, and reactive to light.  Neck: Normal range of motion. Neck supple. No JVD present.  Cardiovascular: Normal rate, regular rhythm, S1 normal, S2 normal, normal heart sounds and intact distal pulses.  Exam reveals no gallop and no friction rub.   No murmur heard. Pulmonary/Chest: Effort normal and breath sounds normal. No respiratory distress. He has no wheezes. He has no rales. He exhibits no tenderness.  Abdominal: Soft. Bowel sounds are normal. He exhibits no distension. There is no tenderness.  Musculoskeletal: Normal range of motion. He exhibits no edema or tenderness.  Lymphadenopathy:    He has no cervical adenopathy.  Neurological: He is alert and oriented to person, place, and time. Coordination normal.  Skin: Skin is warm and dry. No rash noted. No erythema.  Psychiatric: He has a normal mood and affect. His behavior is normal. Judgment and thought content normal.      Assessment and Plan   Nursing note and vitals reviewed.

## 2014-10-16 NOTE — Assessment & Plan Note (Signed)
Blood pressure well controlled. Given his tachycardia we will add metoprolol succinate 25 mg at nighttime  Follow-up with Dr. Caryl Comes in August 2016

## 2014-12-18 ENCOUNTER — Ambulatory Visit (INDEPENDENT_AMBULATORY_CARE_PROVIDER_SITE_OTHER): Payer: Medicare Other | Admitting: Internal Medicine

## 2014-12-18 ENCOUNTER — Encounter: Payer: Self-pay | Admitting: Internal Medicine

## 2014-12-18 VITALS — BP 120/62 | HR 60 | Ht 67.0 in | Wt 180.5 lb

## 2014-12-18 DIAGNOSIS — Z950 Presence of cardiac pacemaker: Secondary | ICD-10-CM

## 2014-12-18 DIAGNOSIS — I10 Essential (primary) hypertension: Secondary | ICD-10-CM

## 2014-12-18 DIAGNOSIS — I441 Atrioventricular block, second degree: Secondary | ICD-10-CM

## 2014-12-18 DIAGNOSIS — R9431 Abnormal electrocardiogram [ECG] [EKG]: Secondary | ICD-10-CM

## 2014-12-18 LAB — CUP PACEART INCLINIC DEVICE CHECK
Battery Remaining Longevity: 126 mo
Brady Statistic RV Percent Paced: 99.94 %
Date Time Interrogation Session: 20160830121342
Lead Channel Impedance Value: 387.5 Ohm
Lead Channel Impedance Value: 762.5 Ohm
Lead Channel Pacing Threshold Amplitude: 0.5 V
Lead Channel Pacing Threshold Amplitude: 0.625 V
Lead Channel Pacing Threshold Pulse Width: 0.5 ms
Lead Channel Pacing Threshold Pulse Width: 0.5 ms
MDC IDC MSMT BATTERY VOLTAGE: 3.02 V
MDC IDC MSMT LEADCHNL RA PACING THRESHOLD AMPLITUDE: 0.5 V
MDC IDC MSMT LEADCHNL RA PACING THRESHOLD PULSEWIDTH: 0.5 ms
MDC IDC MSMT LEADCHNL RA SENSING INTR AMPL: 5 mV
MDC IDC PG SERIAL: 7664739
MDC IDC SET LEADCHNL RA PACING AMPLITUDE: 2 V
MDC IDC SET LEADCHNL RV PACING AMPLITUDE: 0.875
MDC IDC SET LEADCHNL RV PACING PULSEWIDTH: 0.5 ms
MDC IDC SET LEADCHNL RV SENSING SENSITIVITY: 2 mV
MDC IDC STAT BRADY RA PERCENT PACED: 12 %
Pulse Gen Model: 2240

## 2014-12-18 NOTE — Patient Instructions (Signed)
Medication Instructions:  Your physician recommends that you continue on your current medications as directed. Please refer to the Current Medication list given to you today.   Labwork: none  Testing/Procedures: none  Follow-Up: Your physician wants you to follow-up in: one year with Dr. Gari Crown will receive a reminder letter in the mail two months in advance. If you don't receive a letter, please call our office to schedule the follow-up appointment.   Any Other Special Instructions Will Be Listed Below (If Applicable).

## 2014-12-18 NOTE — Progress Notes (Signed)
Patient Care Team: Arlis Porta., MD as PCP - General (Family Medicine)   HPI  Clinton Joseph is a 81 y.o. male Seen in follow-up for pacemaker implanted 8/15 by Dr. Greggory Brandy for syncope and Mobitz 2 heart block and intermittent complete heart block and pauses of greater than 6 seconds  He has mildly obstructive coronary disease by cath 2012. Echocardiogram 8/15 demonstrated EF of 55-60% with aortic sclerosis without stenosis.  The patient denies chest pain, shortness of breath, nocturnal dyspnea, orthopnea or peripheral edema.  There have been no palpitations, lightheadedness or syncope.    Past Medical History  Diagnosis Date  . Unspecified essential hypertension   . Anxiety state, unspecified   . Tremor     right hand  . Urticaria, unspecified   . Hyperlipidemia   . Pacemaker   . Diffuse large B cell lymphoma 2007    "had chemo treatments"  . Arthritis     "in hands a little" (12/05/2013)    Past Surgical History  Procedure Laterality Date  . Insert / replace / remove pacemaker  12/05/2013  . Appendectomy  1948  . Cardiac catheterization  11/17/2010    [facility]- LAD min irregs, LCX min irregs, RCA 40p, EF 60%.   . Cataract extraction, bilateral Bilateral ~ 2012  . Permanent pacemaker insertion N/A 12/05/2013    Procedure: PERMANENT PACEMAKER INSERTION;  Surgeon: Coralyn Mark, MD;  Location: [place] [facility];  Service: Cardiovascular;  Laterality: N/A;    Current Outpatient Prescriptions  Medication Sig Dispense Refill  . cetirizine (ZYRTEC) 10 MG tablet Take 10 mg by mouth daily.    . Cholecalciferol (VITAMIN D PO) Take 2 tablets by mouth daily.    . enalapril (VASOTEC) 10 MG tablet Take 10 mg by mouth daily.     . ferrous sulfate 325 (65 FE) MG tablet Take 325 mg by mouth daily with breakfast.    . fluticasone (FLONASE) 50 MCG/ACT nasal spray Place 2 sprays into both nostrils as needed.     . metoprolol succinate (TOPROL XL) 25 MG 24 hr tablet Take 1 tablet  (25 mg total) by mouth daily. 90 tablet 11  . Multiple Vitamin (MULTIVITAMIN) tablet Take 1 tablet by mouth daily.    . Omega-3 Fatty Acids (FISH OIL) 1000 MG CAPS Take 1,200 mg by mouth 2 (two) times daily.     . Skin Protectants, Misc. (EUCERIN) cream Apply 1 application topically as needed for dry skin.    Marland Kitchen vitamin C (ASCORBIC ACID) 500 MG tablet Take 500 mg by mouth daily.     No current facility-administered medications for this visit.    Allergies  Allergen Reactions  . Keflex [Cephalexin]     Diarrhea   . Tape   . Penicillins Hives and Rash    Review of Systems negative except from HPI and PMH  Physical Exam BP 120/62 mmHg  Pulse 60  Ht 5\' 7"  (1.702 m)  Wt 180 lb 8 oz (81.874 kg)  BMI 28.26 kg/m2 Well developed and well nourished in no acute distress HENT normal E scleral and icterus clear Neck Supple JVP flat; carotids brisk and full Clear to ausculation Device pocket well healed; without hematoma or erythema.  There is no tethering Regular rate and rhythm, no murmurs gallops or rub Soft with active bowel sounds No clubbing cyanosis  Edema Alert and oriented, grossly normal motor and sensory function Skin Warm and Dry  ECG was ordered today demonstrated  synchronous pacing  Assessment and  Plan  Complete heart block  Pacemaker-[employer] The patient's device was interrogated and the information was fully reviewed.  The device was reprogrammed to  Maximize longevity  Syncope  No recurrent syncope  Hypertension  Atrial tachycardia  The patient has become device dependent. His device was reprogrammed to maximize longevity  Otherwise stable  BP well controlled   Could potentially stop metoprolol if BP stays well controlled(was elevated 11/15)

## 2015-01-11 ENCOUNTER — Encounter: Payer: Self-pay | Admitting: Internal Medicine

## 2015-01-17 ENCOUNTER — Ambulatory Visit: Payer: Self-pay | Admitting: Family Medicine

## 2015-02-04 ENCOUNTER — Ambulatory Visit (INDEPENDENT_AMBULATORY_CARE_PROVIDER_SITE_OTHER): Payer: Medicare Other | Admitting: Family Medicine

## 2015-02-04 ENCOUNTER — Encounter: Payer: Self-pay | Admitting: Family Medicine

## 2015-02-04 VITALS — BP 185/90 | HR 100 | Temp 97.6°F | Resp 16 | Ht 67.0 in | Wt 181.0 lb

## 2015-02-04 DIAGNOSIS — I10 Essential (primary) hypertension: Secondary | ICD-10-CM | POA: Diagnosis not present

## 2015-02-04 DIAGNOSIS — J019 Acute sinusitis, unspecified: Secondary | ICD-10-CM | POA: Diagnosis not present

## 2015-02-04 MED ORDER — AZITHROMYCIN 250 MG PO TABS: ORAL_TABLET | ORAL | Status: DC

## 2015-02-04 MED ORDER — BENZONATATE 100 MG PO CAPS: 100.0000 mg | ORAL_CAPSULE | Freq: Three times a day (TID) | ORAL | Status: DC | PRN

## 2015-02-04 MED ORDER — GUAIFENESIN ER 600 MG PO TB12: 600.0000 mg | ORAL_TABLET | Freq: Two times a day (BID) | ORAL | Status: DC

## 2015-02-04 NOTE — Patient Instructions (Signed)
Stop taking Mucinex D because of the sudafed and effect on BP.

## 2015-02-04 NOTE — Progress Notes (Signed)
Name: Clinton Joseph   MRN: [id number]    DOB: Sep 20, 1927   Date:02/04/2015       Progress Note  Subjective  Chief Complaint  Chief Complaint  Patient presents with  . Hypertension  . Nasal Congestion    Hypertension Pertinent negatives include no blurred vision, chest pain, headaches, malaise/fatigue, orthopnea, palpitations or shortness of breath.    Here for f/u of HBP.  Taking meds.  Feels bad today from a "cold" that he has had for 4 days.  It is staying the same.  Some throaty cough.  + nasal and sinus congestion.  No fever.  He is taking Mucinex D twice a day for congestion  No problem-specific assessment & plan notes found for this encounter.   Past Medical History  Diagnosis Date  . Unspecified essential hypertension   . Anxiety state, unspecified   . Tremor     right hand  . Urticaria, unspecified   . Hyperlipidemia   . Pacemaker   . Diffuse large B cell lymphoma ([place]) 2007    "had chemo treatments"  . Arthritis     "in hands a little" (12/05/2013)    Social History  Substance Use Topics  . Smoking status: Never Smoker   . Smokeless tobacco: Never Used  . Alcohol Use: 1.2 oz/week    2 Glasses of wine per week     Current outpatient prescriptions:  .  cetirizine (ZYRTEC) 10 MG tablet, Take 10 mg by mouth daily., Disp: , Rfl:  .  Cholecalciferol (VITAMIN D PO), Take 2 tablets by mouth daily., Disp: , Rfl:  .  enalapril (VASOTEC) 10 MG tablet, Take 10 mg by mouth daily. , Disp: , Rfl:  .  escitalopram (LEXAPRO) 10 MG tablet, Take 10 mg by mouth daily., Disp: , Rfl:  .  ferrous sulfate 325 (65 FE) MG tablet, Take 325 mg by mouth daily with breakfast., Disp: , Rfl:  .  fluticasone (FLONASE) 50 MCG/ACT nasal spray, Place 2 sprays into both nostrils as needed. , Disp: , Rfl:  .  metoprolol succinate (TOPROL XL) 25 MG 24 hr tablet, Take 1 tablet (25 mg total) by mouth daily., Disp: 90 tablet, Rfl: 11 .  Multiple Vitamin (MULTIVITAMIN) tablet, Take 1 tablet  by mouth daily., Disp: , Rfl:  .  Omega-3 Fatty Acids (FISH OIL) 1000 MG CAPS, Take 1,200 mg by mouth 2 (two) times daily. , Disp: , Rfl:  .  Skin Protectants, Misc. (EUCERIN) cream, Apply 1 application topically as needed for dry skin., Disp: , Rfl:  .  vitamin C (ASCORBIC ACID) 500 MG tablet, Take 500 mg by mouth daily., Disp: , Rfl:   Allergies  Allergen Reactions  . Keflex [Cephalexin]     Diarrhea   . Tape   . Penicillins Hives and Rash    Review of Systems  Constitutional: Negative for fever, chills, weight loss and malaise/fatigue.  HENT: Positive for congestion. Negative for hearing loss.   Eyes: Negative for blurred vision and double vision.  Respiratory: Positive for cough. Negative for sputum production, shortness of breath and wheezing.   Cardiovascular: Negative for chest pain, palpitations, orthopnea and leg swelling.  Gastrointestinal: Negative for heartburn, abdominal pain and blood in stool.  Genitourinary: Negative for dysuria, urgency and frequency.  Musculoskeletal: Negative for myalgias and joint pain.  Skin: Negative for rash.  Neurological: Negative for dizziness, tremors, weakness and headaches.      Objective  Filed Vitals:   02/04/15 0941  BP:  196/69  Pulse: 88  Temp: 97.6 F (36.4 C)  TempSrc: Oral  Resp: 16  Height: 5\' 7"  (1.702 m)  Weight: 181 lb (82.101 kg)     Physical Exam  Constitutional: He is oriented to person, place, and time and well-developed, well-nourished, and in no distress. No distress.  HENT:  Head: Normocephalic and atraumatic.  Right Ear: Tympanic membrane, external ear and ear canal normal. Decreased hearing is noted.  Left Ear: Tympanic membrane, external ear and ear canal normal. Decreased hearing is noted.  Nose: Mucosal edema and rhinorrhea present. Right sinus exhibits no maxillary sinus tenderness and no frontal sinus tenderness. Left sinus exhibits no maxillary sinus tenderness and no frontal sinus tenderness.   Mouth/Throat: No oropharyngeal exudate, posterior oropharyngeal edema or posterior oropharyngeal erythema.  Eyes: Conjunctivae and EOM are normal. Pupils are equal, round, and reactive to light. No scleral icterus.  Neck: Normal range of motion. Neck supple. Carotid bruit is not present. No thyromegaly present.  Cardiovascular: Regular rhythm, normal heart sounds and intact distal pulses.  Tachycardia present.  Exam reveals no gallop and no friction rub.   No murmur heard. Pulmonary/Chest: Effort normal and breath sounds normal. No respiratory distress. He has no wheezes. He has no rales.  Musculoskeletal: He exhibits no edema.  Lymphadenopathy:    He has no cervical adenopathy.  Neurological: He is alert and oriented to person, place, and time.  Vitals reviewed.     Recent Results (from the past 2160 hour(s))  Implantable device check     Status: None   Collection Time: 12/18/14  4:10 PM  Result Value Ref Range   Date Time Interrogation Session [id number]    Pulse Generator Manufacturer [employer]    Pulse Gen Model 2240 Assurity DR    Pulse Gen Serial Number [id number]    Implantable Pulse Generator Type Implantable Pulse Generator    Implantable Pulse Generator Implant Date [id number]    Lead Channel Setting Sensing Sensitivity 2.0 mV   Lead Channel Setting Pacing Amplitude 2.0 V   Lead Channel Setting Pacing Pulse Width 0.5 ms   Lead Channel Setting Pacing Amplitude 0.875    Lead Channel Impedance Value 387.5 ohm   Lead Channel Sensing Intrinsic Amplitude 5.0 mV   Lead Channel Pacing Threshold Amplitude 0.5 V   Lead Channel Pacing Threshold Pulse Width 0.5 ms   Lead Channel Pacing Threshold Amplitude 0.5 V   Lead Channel Pacing Threshold Pulse Width 0.5 ms   Lead Channel Impedance Value 762.5 ohm   Lead Channel Pacing Threshold Amplitude 0.625 V   Lead Channel Pacing Threshold Pulse Width 0.5 ms   Battery Status Unknown    Battery Remaining Longevity 126.0 mo    Battery Voltage 3.02 V   Brady Statistic RA Percent Paced 12.0 %   Brady Statistic RV Percent Paced 99.94 %   Eval Rhythm Vp@40       Assessment & Plan  1. Essential hypertension -cont. Current meds.  2. Acute sinusitis, recurrence not specified, unspecified location  - guaiFENesin (MUCINEX) 600 MG 12 hr tablet; Take 1 tablet (600 mg total) by mouth 2 (two) times daily.  Dispense: 20 tablet; Refill: 1 - benzonatate (TESSALON) 100 MG capsule; Take 1 capsule (100 mg total) by mouth 3 (three) times daily as needed for cough.  Dispense: 30 capsule; Refill: 1 - azithromycin (ZITHROMAX) 250 MG tablet; Take 2 tablets on day 1, then 1 tablet on days 2-5.  Dispense: 6 tablet; Refill: 0

## 2015-02-21 ENCOUNTER — Ambulatory Visit: Payer: Self-pay | Admitting: Family Medicine

## 2015-03-11 ENCOUNTER — Ambulatory Visit (INDEPENDENT_AMBULATORY_CARE_PROVIDER_SITE_OTHER): Payer: Medicare Other | Admitting: Family Medicine

## 2015-03-11 ENCOUNTER — Encounter: Payer: Self-pay | Admitting: Family Medicine

## 2015-03-11 VITALS — BP 135/65 | HR 60 | Resp 16 | Ht 67.0 in | Wt 174.8 lb

## 2015-03-11 DIAGNOSIS — Z23 Encounter for immunization: Secondary | ICD-10-CM

## 2015-03-11 DIAGNOSIS — R000 Tachycardia, unspecified: Secondary | ICD-10-CM

## 2015-03-11 DIAGNOSIS — I10 Essential (primary) hypertension: Secondary | ICD-10-CM | POA: Diagnosis not present

## 2015-03-11 NOTE — Patient Instructions (Signed)
Continue current meds 

## 2015-03-11 NOTE — Progress Notes (Signed)
Name: Clinton Joseph   MRN: [id number]    DOB: 1928/03/15   Date:03/11/2015       Progress Note  Subjective  Chief Complaint  Chief Complaint  Patient presents with  . Hypertension    HPI For f/u of HBP.Marland Kitchen  Recently lost his son recently to colon cancer.  He is handling death so-so, but says doesn't need any help with anxiety and depression.  No problem-specific assessment & plan notes found for this encounter.   Past Medical History  Diagnosis Date  . Unspecified essential hypertension   . Anxiety state, unspecified   . Tremor     right hand  . Urticaria, unspecified   . Hyperlipidemia   . Pacemaker   . Diffuse large B cell lymphoma ([place]) 2007    "had chemo treatments"  . Arthritis     "in hands a little" (12/05/2013)    Past Surgical History  Procedure Laterality Date  . Insert / replace / remove pacemaker  12/05/2013  . Appendectomy  1948  . Cardiac catheterization  11/17/2010    [facility]- LAD min irregs, LCX min irregs, RCA 40p, EF 60%.   . Cataract extraction, bilateral Bilateral ~ 2012  . Permanent pacemaker insertion N/A 12/05/2013    Procedure: PERMANENT PACEMAKER INSERTION;  Surgeon: Coralyn Mark, MD;  Location: [facility];  Service: Cardiovascular;  Laterality: N/A;    Family History  Problem Relation Age of Onset  . Emphysema Father   . Coronary artery disease Mother     Social History   Social History  . Marital Status: Married    Spouse Name: N/A  . Number of Children: N/A  . Years of Education: N/A   Occupational History  . accountant     retired   Social History Main Topics  . Smoking status: Never Smoker   . Smokeless tobacco: Never Used  . Alcohol Use: 1.2 oz/week    2 Glasses of wine per week  . Drug Use: No  . Sexual Activity: Yes   Other Topics Concern  . Not on file   Social History Narrative     Current outpatient prescriptions:  .  cetirizine (ZYRTEC) 10 MG tablet, Take 10 mg by mouth daily., Disp: , Rfl:  .   Cholecalciferol (VITAMIN D PO), Take 2 tablets by mouth daily., Disp: , Rfl:  .  enalapril (VASOTEC) 10 MG tablet, Take 10 mg by mouth daily. , Disp: , Rfl:  .  escitalopram (LEXAPRO) 10 MG tablet, Take 10 mg by mouth daily., Disp: , Rfl:  .  ferrous sulfate 325 (65 FE) MG tablet, Take 325 mg by mouth daily with breakfast., Disp: , Rfl:  .  fluticasone (FLONASE) 50 MCG/ACT nasal spray, Place 2 sprays into both nostrils as needed. , Disp: , Rfl:  .  metoprolol succinate (TOPROL XL) 25 MG 24 hr tablet, Take 1 tablet (25 mg total) by mouth daily., Disp: 90 tablet, Rfl: 11 .  Multiple Vitamin (MULTIVITAMIN) tablet, Take 1 tablet by mouth daily., Disp: , Rfl:  .  Omega-3 Fatty Acids (FISH OIL) 1000 MG CAPS, Take 1,200 mg by mouth 2 (two) times daily. , Disp: , Rfl:  .  Skin Protectants, Misc. (EUCERIN) cream, Apply 1 application topically as needed for dry skin., Disp: , Rfl:  .  vitamin C (ASCORBIC ACID) 500 MG tablet, Take 500 mg by mouth daily., Disp: , Rfl:   Allergies  Allergen Reactions  . Keflex [Cephalexin]     Diarrhea   .  Tape   . Penicillins Hives and Rash     Review of Systems  Constitutional: Negative for fever, chills and weight loss.  HENT: Negative for hearing loss.   Eyes: Negative for blurred vision and double vision.  Respiratory: Negative for cough, shortness of breath and wheezing.   Cardiovascular: Negative for chest pain, palpitations and leg swelling.  Gastrointestinal: Negative for heartburn, abdominal pain and blood in stool.  Genitourinary: Negative for dysuria, urgency and frequency.  Musculoskeletal: Negative for myalgias and joint pain.  Skin: Negative for rash.  Neurological: Negative for dizziness, tremors, weakness and headaches.  Psychiatric/Behavioral: Positive for depression (situational).      Objective  Filed Vitals:   03/11/15 1131 03/11/15 1208  BP: 128/69 135/65  Pulse: 60   Resp: 16   Height: 5\' 7"  (1.702 m)   Weight: 174 lb 12.8 oz  (79.289 kg)     Physical Exam  Constitutional: He is well-developed, well-nourished, and in no distress.  HENT:  Head: Normocephalic and atraumatic.  Neck: Normal range of motion. Neck supple. Carotid bruit is not present. No thyromegaly present.  Cardiovascular: Normal rate, regular rhythm and intact distal pulses.  Exam reveals no gallop and no friction rub.   No murmur heard. Pulmonary/Chest: Effort normal and breath sounds normal. No respiratory distress. He has no wheezes. He has no rales.  Musculoskeletal: He exhibits no edema.  Lymphadenopathy:    He has no cervical adenopathy.  Psychiatric: Affect normal.  Saddened.  Vitals reviewed.      Recent Results (from the past 2160 hour(s))  Implantable device check     Status: None   Collection Time: 12/18/14  4:10 PM  Result Value Ref Range   Date Time Interrogation Session [id number]    Pulse Generator Manufacturer [employer]    Pulse Gen Model 2240 Assurity DR    Pulse Gen Serial Number [id number]    Implantable Pulse Generator Type Implantable Pulse Generator    Implantable Pulse Generator Implant Date [id number]    Lead Channel Setting Sensing Sensitivity 2.0 mV   Lead Channel Setting Pacing Amplitude 2.0 V   Lead Channel Setting Pacing Pulse Width 0.5 ms   Lead Channel Setting Pacing Amplitude 0.875    Lead Channel Impedance Value 387.5 ohm   Lead Channel Sensing Intrinsic Amplitude 5.0 mV   Lead Channel Pacing Threshold Amplitude 0.5 V   Lead Channel Pacing Threshold Pulse Width 0.5 ms   Lead Channel Pacing Threshold Amplitude 0.5 V   Lead Channel Pacing Threshold Pulse Width 0.5 ms   Lead Channel Impedance Value 762.5 ohm   Lead Channel Pacing Threshold Amplitude 0.625 V   Lead Channel Pacing Threshold Pulse Width 0.5 ms   Battery Status Unknown    Battery Remaining Longevity 126.0 mo   Battery Voltage 3.02 V   Brady Statistic RA Percent Paced 12.0 %   Brady Statistic RV Percent Paced 99.94 %   Eval  Rhythm Vp@40       Assessment & Plan  Problem List Items Addressed This Visit      Cardiovascular and Mediastinum   Essential hypertension - Primary     Other   Tachycardia    Other Visit Diagnoses    Need for influenza vaccination        Relevant Orders    Flu vaccine HIGH DOSE PF (Fluzone High dose)       No orders of the defined types were placed in this encounter.    1. Essential hypertension  2. Tachycardia   3. Need for influenza vaccination - Flu vaccine HIGH DOSE PF (Fluzone High dose)

## 2015-03-19 ENCOUNTER — Ambulatory Visit (INDEPENDENT_AMBULATORY_CARE_PROVIDER_SITE_OTHER): Payer: Medicare Other | Admitting: *Deleted

## 2015-03-19 DIAGNOSIS — I441 Atrioventricular block, second degree: Secondary | ICD-10-CM | POA: Diagnosis not present

## 2015-03-20 NOTE — Progress Notes (Signed)
Remote pacemaker transmission.   

## 2015-03-28 LAB — CUP PACEART REMOTE DEVICE CHECK
Brady Statistic AP VP Percent: 20 %
Brady Statistic AP VS Percent: 1 %
Brady Statistic AS VP Percent: 80 %
Brady Statistic AS VS Percent: 1 %
Brady Statistic RV Percent Paced: 99 %
Implantable Lead Implant Date: 20150818
Implantable Lead Implant Date: 20150818
Lead Channel Impedance Value: 790 Ohm
Lead Channel Pacing Threshold Amplitude: 0.625 V
Lead Channel Pacing Threshold Pulse Width: 0.5 ms
Lead Channel Sensing Intrinsic Amplitude: 10.4 mV
Lead Channel Setting Pacing Amplitude: 0.875
Lead Channel Setting Pacing Amplitude: 2 V
Lead Channel Setting Pacing Pulse Width: 0.5 ms
Lead Channel Setting Sensing Sensitivity: 2 mV
MDC IDC LEAD LOCATION: 753859
MDC IDC LEAD LOCATION: 753860
MDC IDC LEAD MODEL: 1948
MDC IDC MSMT BATTERY REMAINING LONGEVITY: 124 mo
MDC IDC MSMT BATTERY REMAINING PERCENTAGE: 95.5 %
MDC IDC MSMT BATTERY VOLTAGE: 3.02 V
MDC IDC MSMT LEADCHNL RA IMPEDANCE VALUE: 390 Ohm
MDC IDC MSMT LEADCHNL RA PACING THRESHOLD AMPLITUDE: 0.5 V
MDC IDC MSMT LEADCHNL RA PACING THRESHOLD PULSEWIDTH: 0.5 ms
MDC IDC MSMT LEADCHNL RA SENSING INTR AMPL: 5 mV
MDC IDC PG SERIAL: 7664739
MDC IDC SESS DTM: 20161129070015
MDC IDC STAT BRADY RA PERCENT PACED: 19 %

## 2015-03-29 ENCOUNTER — Encounter: Payer: Self-pay | Admitting: Cardiology

## 2015-05-09 ENCOUNTER — Ambulatory Visit (INDEPENDENT_AMBULATORY_CARE_PROVIDER_SITE_OTHER): Payer: Medicare Other | Admitting: Nurse Practitioner

## 2015-05-09 ENCOUNTER — Encounter: Payer: Self-pay | Admitting: *Deleted

## 2015-05-09 ENCOUNTER — Encounter: Payer: Self-pay | Admitting: Nurse Practitioner

## 2015-05-09 VITALS — BP 150/72 | HR 60 | Ht 67.5 in | Wt 178.1 lb

## 2015-05-09 DIAGNOSIS — I441 Atrioventricular block, second degree: Secondary | ICD-10-CM | POA: Diagnosis not present

## 2015-05-09 DIAGNOSIS — I119 Hypertensive heart disease without heart failure: Secondary | ICD-10-CM

## 2015-05-09 NOTE — Patient Instructions (Signed)
Medication Instructions:  Your physician recommends that you continue on your current medications as directed. Please refer to the Current Medication list given to you today.   Labwork: none  Testing/Procedures: none  Follow-Up: Your physician wants you to follow-up in: six months with Dr. Gollan. You will receive a reminder letter in the mail two months in advance. If you don't receive a letter, please call our office to schedule the follow-up appointment.   Any Other Special Instructions Will Be Listed Below (If Applicable).     If you need a refill on your cardiac medications before your next appointment, please call your pharmacy.   

## 2015-05-09 NOTE — Progress Notes (Signed)
Office Visit    Patient Name: Clinton Joseph Date of Encounter: 05/09/2015  Primary Care Provider:  Dicky Doe, MD Primary Cardiologist:  [name], MD /S. Caryl Comes, MD   Chief Complaint    82 year old male with a history of hypertension, nonobstructive CAD, and Mobitz 2 heart block associated with syncope status post pacemaker, who presents for follow-up.  Past Medical History    Past Medical History  Diagnosis Date  . Anxiety state, unspecified   . Tremor     a. right hand  . Urticaria, unspecified   . Hyperlipidemia   . Diffuse large B cell lymphoma ([place])     a. 2007 s/p chemo - followed by Dr. Jeb Levering.  . Arthritis     a. Hands.  . Ventricular tachycardia ([place])     a. 2012->broke with amio, neg w/u.  . Non-obstructive CAD     a. 10/2010 Cath: >M nl, LAD/LCX min irregs, RCA 40p, EF 60%.  . Mobitz II     a. 11/2013 syncope-->s/p SJM Assurity DR model [id number] (ser # [id number]).  . H/O echocardiogram     a. 11/2013 Echo: EF 55-60%, mildly dil LA, mild to mod Ao sclerosis.  . Hypertensive heart disease    Past Surgical History  Procedure Laterality Date  . Insert / replace / remove pacemaker  12/05/2013  . Appendectomy  1948  . Cardiac catheterization  11/17/2010    [facility]- LAD min irregs, LCX min irregs, RCA 40p, EF 60%.   . Cataract extraction, bilateral Bilateral ~ 2012  . Permanent pacemaker insertion N/A 12/05/2013    Procedure: PERMANENT PACEMAKER INSERTION;  Surgeon: Coralyn Mark, MD;  Location: [facility];  Service: Cardiovascular;  Laterality: N/A;    Allergies  Allergies  Allergen Reactions  . Keflex [Cephalexin]     Diarrhea   . Penicillins Hives and Rash  . Tape Rash    History of Present Illness    82 year old male with the above, plus past medical history. He is status post permanent pacemaker placement in August 2015 secondary to Mobitz 2 heart block with associated syncope. He was last seen in clinic in June 2016. Since that time, he has  done well. He is fairly active, saying that his wife will let him be inactive. She is with him today and agrees. He has not been having any chest pain, dyspnea, palpitations, PND, orthopnea, dizziness, syncope, edema, or early satiety. His blood pressure is modestly elevated today. He does not routinely check this at home.  Home Medications    Prior to Admission medications   Medication Sig Start Date End Date Taking? Authorizing Provider  cetirizine (ZYRTEC) 10 MG tablet Take 10 mg by mouth daily.   Yes Historical Provider, MD  Cholecalciferol (VITAMIN D PO) Take 2 tablets by mouth daily.   Yes Historical Provider, MD  enalapril (VASOTEC) 10 MG tablet Take 10 mg by mouth daily.    Yes Historical Provider, MD  escitalopram (LEXAPRO) 10 MG tablet Take 10 mg by mouth daily.   Yes Historical Provider, MD  ferrous sulfate 325 (65 FE) MG tablet Take 325 mg by mouth daily with breakfast.   Yes Historical Provider, MD  fluticasone (FLONASE) 50 MCG/ACT nasal spray Place 2 sprays into both nostrils daily as needed for allergies or rhinitis.    Yes Historical Provider, MD  metoprolol succinate (TOPROL XL) 25 MG 24 hr tablet Take 1 tablet (25 mg total) by mouth daily. 10/16/14  Yes Christia Reading  Gloriajean Dell, MD  Multiple Vitamin (MULTIVITAMIN) tablet Take 1 tablet by mouth daily.   Yes Historical Provider, MD  Omega-3 Fatty Acids (FISH OIL) 1000 MG CAPS Take 1,200 mg by mouth 2 (two) times daily.    Yes Historical Provider, MD  Skin Protectants, Misc. (EUCERIN) cream Apply 1 application topically daily as needed for dry skin.    Yes Historical Provider, MD  vitamin C (ASCORBIC ACID) 500 MG tablet Take 500 mg by mouth daily.   Yes Historical Provider, MD    Review of Systems    Overall doing well.  He denies chest pain, palpitations, dyspnea, pnd, orthopnea, n, v, dizziness, syncope, edema, weight gain, or early satiety.  All other systems reviewed and are otherwise negative except as noted above.  Physical Exam     VS:  BP 150/72 mmHg  Pulse 60  Ht 5' 7.5" (1.715 m)  Wt 178 lb 1.9 oz (80.795 kg)  BMI 27.47 kg/m2 , BMI Body mass index is 27.47 kg/(m^2). GEN: Well nourished, well developed, in no acute distress. HEENT: normal. Neck: Supple, no JVD, carotid bruits, or masses. Cardiac: RRR, no murmurs, rubs, or gallops. No clubbing, cyanosis, edema.  Radials/DP/PT 2+ and equal bilaterally.  Respiratory:  Respirations regular and unlabored, clear to auscultation bilaterally. GI: Soft, nontender, nondistended, BS + x 4. MS: no deformity or atrophy. Skin: warm and dry, no rash. Neuro:  Strength and sensation are intact. Psych: Normal affect.  Accessory Clinical Findings    ECG - A-V paced rhythm, 60, no acute ST-T changes.  Assessment & Plan    1.  Hypertensive heart disease: Blood pressure is modestly elevated at 150/72 today. He does not check it at home. He is currently on low-dose metoprolol and enalapril. I recommended that he and his wife follow his blood pressure over the next few weeks at home and call us if he is trending consistently over 150. If he were, I would increase his enalapril to 10 mg twice a day.   2. Mobitz 2 heart block/syncope: Status post permanent pacemaker placement in August 2015. His pacemaker site closely by Dr. Caryl Comes.  3. Disposition: Follow-up with Dr. Mitzi Hansen in 6 months or sooner if necessary.  Murray Hodgkins, NP 05/09/2015, 12:38 PM

## 2015-06-18 ENCOUNTER — Ambulatory Visit (INDEPENDENT_AMBULATORY_CARE_PROVIDER_SITE_OTHER): Payer: Medicare Other | Admitting: *Deleted

## 2015-06-18 DIAGNOSIS — I441 Atrioventricular block, second degree: Secondary | ICD-10-CM

## 2015-06-18 NOTE — Progress Notes (Signed)
Remote pacemaker transmission.   

## 2015-07-09 ENCOUNTER — Encounter: Payer: Self-pay | Admitting: Family Medicine

## 2015-07-09 ENCOUNTER — Ambulatory Visit (INDEPENDENT_AMBULATORY_CARE_PROVIDER_SITE_OTHER): Payer: Medicare Other | Admitting: Family Medicine

## 2015-07-09 VITALS — BP 110/60 | HR 60 | Resp 16 | Ht 67.5 in | Wt 178.2 lb

## 2015-07-09 DIAGNOSIS — I472 Ventricular tachycardia, unspecified: Secondary | ICD-10-CM

## 2015-07-09 DIAGNOSIS — I10 Essential (primary) hypertension: Secondary | ICD-10-CM

## 2015-07-09 DIAGNOSIS — F419 Anxiety disorder, unspecified: Secondary | ICD-10-CM | POA: Diagnosis not present

## 2015-07-09 MED ORDER — METOPROLOL SUCCINATE ER 25 MG PO TB24: ORAL_TABLET | ORAL | Status: DC

## 2015-07-09 NOTE — Progress Notes (Signed)
Name: Clinton Joseph   MRN: [id number]    DOB: [birth year]   Date:07/09/2015       Progress Note  Subjective  Chief Complaint  Chief Complaint  Patient presents with  . Hypertension    HPI Here for f/u of HBO and depression.  He has been feeling ok.  Depression is OK.    No problem-specific assessment & plan notes found for this encounter.   Past Medical History  Diagnosis Date  . Anxiety state, unspecified   . Tremor     a. right hand  . Urticaria, unspecified   . Hyperlipidemia   . Diffuse large B cell lymphoma ([place])     a. 2007 s/p chemo - followed by Dr. Jeb Levering.  . Arthritis     a. Hands.  . Ventricular tachycardia ([place])     a. 2012->broke with amio, neg w/u.  . Non-obstructive CAD     a. 10/2010 Cath: >M nl, LAD/LCX min irregs, RCA 40p, EF 60%.  . Mobitz II     a. 11/2013 syncope-->s/p SJM Assurity DR model [id number] (ser # [id number]).  . H/O echocardiogram     a. 11/2013 Echo: EF 55-60%, mildly dil LA, mild to mod Ao sclerosis.  . Hypertensive heart disease     Past Surgical History  Procedure Laterality Date  . Insert / replace / remove pacemaker  12/05/2013  . Appendectomy  1948  . Cardiac catheterization  11/17/2010    [facility]- LAD min irregs, LCX min irregs, RCA 40p, EF 60%.   . Cataract extraction, bilateral Bilateral ~ 2012  . Permanent pacemaker insertion N/A 12/05/2013    Procedure: PERMANENT PACEMAKER INSERTION;  Surgeon: Coralyn Mark, MD;  Location: [facility];  Service: Cardiovascular;  Laterality: N/A;    Family History  Problem Relation Age of Onset  . Emphysema Father   . Coronary artery disease Mother     Social History   Social History  . Marital Status: Married    Spouse Name: N/A  . Number of Children: N/A  . Years of Education: N/A   Occupational History  . accountant     retired   Social History Main Topics  . Smoking status: Never Smoker   . Smokeless tobacco: Never Used  . Alcohol Use: 1.2 oz/week    2 Glasses of wine per  week  . Drug Use: No  . Sexual Activity: Yes   Other Topics Concern  . Not on file   Social History Narrative     Current outpatient prescriptions:  .  aspirin 81 MG tablet, Take by mouth., Disp: , Rfl:  .  cetirizine (ZYRTEC) 10 MG tablet, Take 10 mg by mouth daily., Disp: , Rfl:  .  Cholecalciferol (VITAMIN D PO), Take 2 tablets by mouth daily., Disp: , Rfl:  .  diphenhydrAMINE (ALLERGY) 25 mg capsule, Take by mouth., Disp: , Rfl:  .  enalapril (VASOTEC) 10 MG tablet, Take 10 mg by mouth daily. , Disp: , Rfl:  .  escitalopram (LEXAPRO) 10 MG tablet, Take 10 mg by mouth daily., Disp: , Rfl:  .  ferrous sulfate 325 (65 FE) MG tablet, Take 325 mg by mouth daily with breakfast., Disp: , Rfl:  .  fluticasone (FLONASE) 50 MCG/ACT nasal spray, Place 2 sprays into both nostrils daily as needed for allergies or rhinitis. , Disp: , Rfl:  .  metoprolol succinate (TOPROL XL) 25 MG 24 hr tablet, Take 1/2 tablet by mouth each AM., Disp: 45 tablet,  Rfl: 3 .  Multiple Vitamin (MULTIVITAMIN) tablet, Take 1 tablet by mouth daily., Disp: , Rfl:  .  Multiple Vitamins-Minerals (MULTIVITAMIN WITH MINERALS) tablet, Take by mouth., Disp: , Rfl:  .  Omega-3 Fatty Acids (FISH OIL) 1000 MG CAPS, Take 1,200 mg by mouth 2 (two) times daily. , Disp: , Rfl:  .  Skin Protectants, Misc. (EUCERIN) cream, Apply 1 application topically daily as needed for dry skin. , Disp: , Rfl:  .  vitamin C (ASCORBIC ACID) 500 MG tablet, Take 500 mg by mouth daily., Disp: , Rfl:   Allergies  Allergen Reactions  . Keflex [Cephalexin]     Diarrhea   . Penicillins Hives and Rash  . Tape Rash     Review of Systems  Constitutional: Negative for fever, chills, weight loss and malaise/fatigue.  HENT: Negative for hearing loss.   Eyes: Negative for blurred vision and double vision.  Respiratory: Negative for cough, shortness of breath and wheezing.   Cardiovascular: Negative for chest pain, palpitations and leg swelling.   Gastrointestinal: Negative for heartburn, abdominal pain and blood in stool.  Genitourinary: Negative for dysuria, urgency and frequency.  Skin: Negative for rash.  Neurological: Negative for dizziness, tremors, weakness and headaches.  Psychiatric/Behavioral: Negative for depression (doing well).      Objective  Filed Vitals:   07/09/15 1115 07/09/15 1149 07/09/15 1150  BP: 116/60 110/60   Pulse: 60  60  Resp: 16    Height: 5' 7.5" (1.715 m)    Weight: 178 lb 3.2 oz (80.831 kg)    SpO2: 96%      Physical Exam  Constitutional: He is oriented to person, place, and time and well-developed, well-nourished, and in no distress. No distress.  HENT:  Head: Normocephalic and atraumatic.  Eyes: Conjunctivae and EOM are normal. Pupils are equal, round, and reactive to light. No scleral icterus.  Neck: Normal range of motion. Neck supple. Carotid bruit is not present. No thyromegaly present.  Cardiovascular: Normal rate, regular rhythm and normal heart sounds.  Exam reveals no gallop and no friction rub.   No murmur heard. Pulmonary/Chest: Effort normal and breath sounds normal. No respiratory distress. He has no wheezes. He has no rales.  Abdominal: Soft. Bowel sounds are normal. He exhibits no distension, no abdominal bruit and no mass. There is no tenderness.  Musculoskeletal: He exhibits no edema.  Lymphadenopathy:    He has no cervical adenopathy.  Neurological: He is alert and oriented to person, place, and time.  Psychiatric: Mood, memory, affect and judgment normal.  Vitals reviewed.      No results found for this or any previous visit (from the past 2160 hour(s)).   Assessment & Plan  Problem List Items Addressed This Visit      Cardiovascular and Mediastinum   VT (ventricular tachycardia) (HCC)   Relevant Medications   aspirin 81 MG tablet   metoprolol succinate (TOPROL XL) 25 MG 24 hr tablet   Essential hypertension - Primary   Relevant Medications   aspirin  81 MG tablet   metoprolol succinate (TOPROL XL) 25 MG 24 hr tablet     Other   Anxiety      Meds ordered this encounter  Medications  . aspirin 81 MG tablet    Sig: Take by mouth.  . diphenhydrAMINE (ALLERGY) 25 mg capsule    Sig: Take by mouth.  . Multiple Vitamins-Minerals (MULTIVITAMIN WITH MINERALS) tablet    Sig: Take by mouth.  . metoprolol succinate (TOPROL  XL) 25 MG 24 hr tablet    Sig: Take 1/2 tablet by mouth each AM.    Dispense:  45 tablet    Refill:  3   1. Essential hypertension Cont. enalapril - metoprolol succinate (TOPROL XL) 25 MG 24 hr tablet; Take 1/2 tablet by mouth each AM.  Dispense: 45 tablet; Refill: 3 RTC-3 mos. 2. VT (ventricular tachycardia) (HCC)  - metoprolol succinate (TOPROL XL) 25 MG 24 hr tablet; Take 1/2 tablet by mouth each AM.  Dispense: 45 tablet; Refill: 3  3. Anxiety Cont. Lexapro

## 2015-07-19 ENCOUNTER — Encounter: Payer: Self-pay | Admitting: Cardiology

## 2015-07-19 LAB — CUP PACEART REMOTE DEVICE CHECK
Battery Remaining Percentage: 95.5 %
Battery Voltage: 3.02 V
Brady Statistic AP VP Percent: 21 %
Brady Statistic AS VP Percent: 79 %
Brady Statistic RA Percent Paced: 20 %
Brady Statistic RV Percent Paced: 99 %
Implantable Lead Implant Date: 20150818
Implantable Lead Location: 753860
Implantable Lead Model: 1948
Lead Channel Impedance Value: 790 Ohm
Lead Channel Pacing Threshold Amplitude: 0.5 V
Lead Channel Pacing Threshold Pulse Width: 0.5 ms
Lead Channel Sensing Intrinsic Amplitude: 12 mV
Lead Channel Sensing Intrinsic Amplitude: 5 mV
Lead Channel Setting Pacing Amplitude: 2 V
Lead Channel Setting Sensing Sensitivity: 2 mV
MDC IDC LEAD IMPLANT DT: 20150818
MDC IDC LEAD LOCATION: 753859
MDC IDC MSMT BATTERY REMAINING LONGEVITY: 125 mo
MDC IDC MSMT LEADCHNL RA IMPEDANCE VALUE: 390 Ohm
MDC IDC MSMT LEADCHNL RV PACING THRESHOLD AMPLITUDE: 0.625 V
MDC IDC MSMT LEADCHNL RV PACING THRESHOLD PULSEWIDTH: 0.5 ms
MDC IDC PG SERIAL: 7664739
MDC IDC SESS DTM: 20170228074904
MDC IDC SET LEADCHNL RV PACING AMPLITUDE: 0.875
MDC IDC SET LEADCHNL RV PACING PULSEWIDTH: 0.5 ms
MDC IDC STAT BRADY AP VS PERCENT: 1 %
MDC IDC STAT BRADY AS VS PERCENT: 1 %

## 2015-07-29 ENCOUNTER — Ambulatory Visit (INDEPENDENT_AMBULATORY_CARE_PROVIDER_SITE_OTHER): Payer: Medicare Other | Admitting: Family Medicine

## 2015-07-29 ENCOUNTER — Encounter: Payer: Self-pay | Admitting: Internal Medicine

## 2015-07-29 DIAGNOSIS — Z950 Presence of cardiac pacemaker: Secondary | ICD-10-CM

## 2015-07-29 DIAGNOSIS — I441 Atrioventricular block, second degree: Secondary | ICD-10-CM

## 2015-07-29 DIAGNOSIS — R251 Tremor, unspecified: Secondary | ICD-10-CM | POA: Diagnosis not present

## 2015-07-29 DIAGNOSIS — F419 Anxiety disorder, unspecified: Secondary | ICD-10-CM | POA: Diagnosis not present

## 2015-07-29 LAB — POCT INFLUENZA A/B
Influenza A, POC: NEGATIVE
Influenza B, POC: NEGATIVE

## 2015-07-29 NOTE — Patient Instructions (Signed)
I think your symptoms could be anxiety related. I also think it is worth getting your pacemaker checked to ensure nothing from your heart is causing your symptoms.   IF you experience chest pain, shortness of breath, severe shaking or any concerning symptoms, please go to the ER.

## 2015-07-29 NOTE — Progress Notes (Signed)
Subjective:    Patient ID: Clinton Joseph, male    DOB: 1927-12-02, 82 y.o.   MRN: [id number]  HPI: Clinton Joseph is a 82 y.o. male presenting on 07/29/2015 for Shaking   HPI  Pt presents for shaking that onset this morning around 5am.  He awoke from sleep- shaking, felt sick on stomach. Wife gave him ibuprofen. He calmed down. Runny nose and cough- started with allergies. Not new. Had some shortness of breath- while shaking- felt it was hard to get a deep breath. But otherwise breathing normal. No cough. No chest tightness. No wheezing. Did get flu shot. Has a pacemaker. Did not feel faint. Felt nervous when he was shaking. Had a episode like this prior to getting is pacemaker. Wife is concerned about his heart. Sees Dr. Rockey Situ- last visit in Feb. No recent stressors.   Past Medical History  Diagnosis Date  . Anxiety state, unspecified   . Tremor     a. right hand  . Urticaria, unspecified   . Hyperlipidemia   . Diffuse large B cell lymphoma ([place])     a. 2007 s/p chemo - followed by Dr. Jeb Levering.  . Arthritis     a. Hands.  . Ventricular tachycardia ([place])     a. 2012->broke with amio, neg w/u.  . Non-obstructive CAD     a. 10/2010 Cath: >M nl, LAD/LCX min irregs, RCA 40p, EF 60%.  . Mobitz II     a. 11/2013 syncope-->s/p SJM Assurity DR model [id number] (ser # [id number]).  . H/O echocardiogram     a. 11/2013 Echo: EF 55-60%, mildly dil LA, mild to mod Ao sclerosis.  . Hypertensive heart disease     Current Outpatient Prescriptions on File Prior to Visit  Medication Sig  . aspirin 81 MG tablet Take by mouth.  . cetirizine (ZYRTEC) 10 MG tablet Take 10 mg by mouth daily.  . Cholecalciferol (VITAMIN D PO) Take 2 tablets by mouth daily.  . diphenhydrAMINE (ALLERGY) 25 mg capsule Take by mouth.  . enalapril (VASOTEC) 10 MG tablet Take 10 mg by mouth daily.   Marland Kitchen escitalopram (LEXAPRO) 10 MG tablet Take 10 mg by mouth daily.  . ferrous sulfate 325 (65 FE) MG tablet Take 325 mg  by mouth daily with breakfast.  . fluticasone (FLONASE) 50 MCG/ACT nasal spray Place 2 sprays into both nostrils daily as needed for allergies or rhinitis.   . metoprolol succinate (TOPROL XL) 25 MG 24 hr tablet Take 1/2 tablet by mouth each AM.  . Multiple Vitamin (MULTIVITAMIN) tablet Take 1 tablet by mouth daily.  . Multiple Vitamins-Minerals (MULTIVITAMIN WITH MINERALS) tablet Take by mouth.  . Omega-3 Fatty Acids (FISH OIL) 1000 MG CAPS Take 1,200 mg by mouth 2 (two) times daily.   . Skin Protectants, Misc. (EUCERIN) cream Apply 1 application topically daily as needed for dry skin.   Marland Kitchen vitamin C (ASCORBIC ACID) 500 MG tablet Take 500 mg by mouth daily.   No current facility-administered medications on file prior to visit.    Review of Systems  Constitutional: Negative for fever and chills.  HENT: Positive for postnasal drip and rhinorrhea.   Respiratory: Positive for shortness of breath (while shaking). Negative for chest tightness and wheezing.   Cardiovascular: Negative for chest pain, palpitations and leg swelling.  Gastrointestinal: Negative for nausea, vomiting and abdominal pain.  Endocrine: Negative.   Genitourinary: Negative for dysuria, urgency, discharge, penile pain and testicular pain.  Musculoskeletal: Negative for back pain,  joint swelling and arthralgias.  Skin: Negative.   Neurological: Negative for dizziness, weakness, numbness and headaches.       Shaking started 5 am   Psychiatric/Behavioral: Negative for sleep disturbance and dysphoric mood.   Per HPI unless specifically indicated above     Objective:    BP 150/84 mmHg  Pulse 60  Temp(Src) 98.4 F (36.9 C) (Oral)  Resp 16  Ht 5' 7.5" (1.715 m)  Wt 185 lb (83.915 kg)  BMI 28.53 kg/m2  SpO2 98%  Wt Readings from Last 3 Encounters:  07/29/15 185 lb (83.915 kg)  07/09/15 178 lb 3.2 oz (80.831 kg)  05/09/15 178 lb 1.9 oz (80.795 kg)    Physical Exam  Constitutional: He is oriented to person, place,  and time. He appears well-developed and well-nourished. No distress.  HENT:  Head: Normocephalic and atraumatic.  Neck: Neck supple. No thyromegaly present.  Cardiovascular: Normal rate, regular rhythm and normal heart sounds.  Exam reveals no gallop and no friction rub.   No murmur heard. Pulmonary/Chest: Effort normal and breath sounds normal. He has no wheezes.  Abdominal: Soft. Bowel sounds are normal. He exhibits no distension. There is no tenderness. There is no rebound.  Musculoskeletal: Normal range of motion. He exhibits no edema or tenderness.  Neurological: He is alert and oriented to person, place, and time. He has normal reflexes.  Skin: Skin is warm and dry. No rash noted. No erythema.  Psychiatric: He has a normal mood and affect. His behavior is normal. Thought content normal.   Results for orders placed or performed in visit on 07/29/15  POCT Influenza A/B  Result Value Ref Range   Influenza A, POC Negative Negative   Influenza B, POC Negative Negative      Assessment & Plan:   Problem List Items Addressed This Visit      Other   Anxiety    Likely cause of shaking. Have discussed treatment options with wife and patient. Do not feel a PRN anxiety medication is warrented. We will consider low dose SSRI if symptoms continue.       Pacemaker    No pacer spikes on ECG today. Unsure if patient is demand pacing only. Low concern for cardiac cause of symptoms, however due to wife's reports have consulted with Dr. Olin Pia nurse. They will interrogate device today to ensure no arrhythmias prevent.        Relevant Orders   EKG 12-Lead (Completed)    Other Visit Diagnoses    Episode of shaking        Likely 2/2 anxiety or early infection but due to wife's concern about similiar episode prior to cardiac issues, have contacted cardiology. His device will be interpreted to r/o cardiac cause. Check CBC and CMET. Today.     Relevant Orders    EKG 12-Lead (Completed)    CBC  with Differential/Platelet    Comprehensive metabolic panel    POCT Influenza A/B (Completed)    Comprehensive metabolic panel    CBC with Differential/Platelet       No orders of the defined types were placed in this encounter.      Follow up plan: Return in about 4 weeks (around 08/26/2015), or if symptoms worsen or fail to improve, for shaking.

## 2015-07-29 NOTE — Assessment & Plan Note (Signed)
No pacer spikes on ECG today. Unsure if patient is demand pacing only. Low concern for cardiac cause of symptoms, however due to wife's reports have consulted with Dr. Olin Pia nurse. They will interrogate device today to ensure no arrhythmias prevent.

## 2015-07-29 NOTE — Assessment & Plan Note (Signed)
Likely cause of shaking. Have discussed treatment options with wife and patient. Do not feel a PRN anxiety medication is warrented. We will consider low dose SSRI if symptoms continue.

## 2015-07-30 ENCOUNTER — Other Ambulatory Visit: Payer: Self-pay | Admitting: Family Medicine

## 2015-07-30 ENCOUNTER — Telehealth: Payer: Self-pay | Admitting: *Deleted

## 2015-07-30 DIAGNOSIS — E875 Hyperkalemia: Secondary | ICD-10-CM

## 2015-07-30 LAB — COMPREHENSIVE METABOLIC PANEL
A/G RATIO: 1.8 (ref 1.2–2.2)
ALBUMIN: 4.3 g/dL (ref 3.5–4.7)
ALT: 37 IU/L (ref 0–44)
AST: 42 IU/L — ABNORMAL HIGH (ref 0–40)
Alkaline Phosphatase: 52 IU/L (ref 39–117)
BILIRUBIN TOTAL: 0.5 mg/dL (ref 0.0–1.2)
BUN / CREAT RATIO: 17 (ref 10–24)
BUN: 22 mg/dL (ref 8–27)
CALCIUM: 10 mg/dL (ref 8.6–10.2)
CHLORIDE: 101 mmol/L (ref 96–106)
CO2: 25 mmol/L (ref 18–29)
Creatinine, Ser: 1.26 mg/dL (ref 0.76–1.27)
GFR, EST AFRICAN AMERICAN: 60 mL/min/{1.73_m2} (ref >59)
GFR, EST NON AFRICAN AMERICAN: 52 mL/min/{1.73_m2} — AB (ref >59)
GLOBULIN, TOTAL: 2.4 g/dL (ref 1.5–4.5)
Glucose: 109 mg/dL — ABNORMAL HIGH (ref 65–99)
POTASSIUM: 6 mmol/L — AB (ref 3.5–5.2)
SODIUM: 144 mmol/L (ref 134–144)
TOTAL PROTEIN: 6.7 g/dL (ref 6.0–8.5)

## 2015-07-30 LAB — CBC WITH DIFFERENTIAL/PLATELET
BASOS: 1 %
Basophils Absolute: 0 10*3/uL (ref 0.0–0.2)
EOS (ABSOLUTE): 0.1 10*3/uL (ref 0.0–0.4)
EOS: 2 %
HEMATOCRIT: 37.2 % — AB (ref 37.5–51.0)
HEMOGLOBIN: 12.4 g/dL — AB (ref 12.6–17.7)
IMMATURE GRANS (ABS): 0 10*3/uL (ref 0.0–0.1)
Immature Granulocytes: 0 %
LYMPHS: 32 %
Lymphocytes Absolute: 1.8 10*3/uL (ref 0.7–3.1)
MCH: 31.1 pg (ref 26.6–33.0)
MCHC: 33.3 g/dL (ref 31.5–35.7)
MCV: 93 fL (ref 79–97)
Monocytes Absolute: 0.4 10*3/uL (ref 0.1–0.9)
Monocytes: 7 %
NEUTROS ABS: 3.1 10*3/uL (ref 1.4–7.0)
Neutrophils: 58 %
PLATELETS: 128 10*3/uL — AB (ref 150–379)
RBC: 3.99 x10E6/uL — ABNORMAL LOW (ref 4.14–5.80)
RDW: 13.7 % (ref 12.3–15.4)
WBC: 5.4 10*3/uL (ref 3.4–10.8)

## 2015-07-30 MED ORDER — FUROSEMIDE 20 MG PO TABS: 20.0000 mg | ORAL_TABLET | Freq: Every day | ORAL | Status: DC

## 2015-07-30 NOTE — Telephone Encounter (Signed)
Thought I did. It got pended. The pharmacy on file was [facility] in [place]? Thanks! [place]

## 2015-07-30 NOTE — Telephone Encounter (Signed)
Please send Lasix.

## 2015-08-02 DIAGNOSIS — E875 Hyperkalemia: Secondary | ICD-10-CM | POA: Diagnosis not present

## 2015-08-03 LAB — BASIC METABOLIC PANEL
BUN / CREAT RATIO: 26 — AB (ref 10–24)
BUN: 32 mg/dL — AB (ref 8–27)
CALCIUM: 9.9 mg/dL (ref 8.6–10.2)
CO2: 26 mmol/L (ref 18–29)
Chloride: 99 mmol/L (ref 96–106)
Creatinine, Ser: 1.25 mg/dL (ref 0.76–1.27)
GFR, EST AFRICAN AMERICAN: 60 mL/min/{1.73_m2} (ref >59)
GFR, EST NON AFRICAN AMERICAN: 52 mL/min/{1.73_m2} — AB (ref >59)
Glucose: 100 mg/dL — ABNORMAL HIGH (ref 65–99)
Potassium: 4.3 mmol/L (ref 3.5–5.2)
Sodium: 143 mmol/L (ref 134–144)

## 2015-08-07 ENCOUNTER — Encounter: Payer: Self-pay | Admitting: Family Medicine

## 2015-08-07 ENCOUNTER — Inpatient Hospital Stay: Payer: Medicare Other | Admitting: Family Medicine

## 2015-08-07 ENCOUNTER — Inpatient Hospital Stay: Payer: Medicare Other | Attending: Oncology

## 2015-08-07 VITALS — BP 124/54 | HR 72 | Temp 98.0°F | Wt 178.1 lb

## 2015-08-07 DIAGNOSIS — Z8572 Personal history of non-Hodgkin lymphomas: Secondary | ICD-10-CM | POA: Insufficient documentation

## 2015-08-07 DIAGNOSIS — C8590 Non-Hodgkin lymphoma, unspecified, unspecified site: Secondary | ICD-10-CM

## 2015-08-07 LAB — COMPREHENSIVE METABOLIC PANEL WITH GFR
ALT: 37 U/L (ref 17–63)
AST: 39 U/L (ref 15–41)
Albumin: 4.3 g/dL (ref 3.5–5.0)
Alkaline Phosphatase: 49 U/L (ref 38–126)
Anion gap: 5 (ref 5–15)
BUN: 28 mg/dL — ABNORMAL HIGH (ref 6–20)
CO2: 29 mmol/L (ref 22–32)
Calcium: 9.8 mg/dL (ref 8.9–10.3)
Chloride: 105 mmol/L (ref 101–111)
Creatinine, Ser: 1.32 mg/dL — ABNORMAL HIGH (ref 0.61–1.24)
GFR calc Af Amer: 55 mL/min — ABNORMAL LOW
GFR calc non Af Amer: 47 mL/min — ABNORMAL LOW
Glucose, Bld: 128 mg/dL — ABNORMAL HIGH (ref 65–99)
Potassium: 4.4 mmol/L (ref 3.5–5.1)
Sodium: 139 mmol/L (ref 135–145)
Total Bilirubin: 0.7 mg/dL (ref 0.3–1.2)
Total Protein: 7.3 g/dL (ref 6.5–8.1)

## 2015-08-07 LAB — CBC WITH DIFFERENTIAL/PLATELET
BASOS ABS: 0 10*3/uL (ref 0–0.1)
Basophils Relative: 1 %
Eosinophils Absolute: 0.2 10*3/uL (ref 0–0.7)
Eosinophils Relative: 4 %
HEMATOCRIT: 38.5 % — AB (ref 40.0–52.0)
Hemoglobin: 13.2 g/dL (ref 13.0–18.0)
Lymphocytes Relative: 28 %
Lymphs Abs: 1.7 10*3/uL (ref 1.0–3.6)
MCH: 31.7 pg (ref 26.0–34.0)
MCHC: 34.3 g/dL (ref 32.0–36.0)
MCV: 92.6 fL (ref 80.0–100.0)
MONO ABS: 0.4 10*3/uL (ref 0.2–1.0)
Monocytes Relative: 6 %
NEUTROS ABS: 3.6 10*3/uL (ref 1.4–6.5)
Neutrophils Relative %: 61 %
PLATELETS: 113 10*3/uL — AB (ref 150–440)
RBC: 4.16 MIL/uL — AB (ref 4.40–5.90)
RDW: 12.7 % (ref 11.5–14.5)
WBC: 5.9 10*3/uL (ref 3.8–10.6)

## 2015-08-07 LAB — LACTATE DEHYDROGENASE: LDH: 146 U/L (ref 98–192)

## 2015-08-13 NOTE — Progress Notes (Signed)
This encounter was created in error - please disregard.

## 2015-08-15 ENCOUNTER — Other Ambulatory Visit: Payer: Self-pay | Admitting: Family Medicine

## 2015-09-17 ENCOUNTER — Ambulatory Visit (INDEPENDENT_AMBULATORY_CARE_PROVIDER_SITE_OTHER): Payer: Medicare Other | Admitting: *Deleted

## 2015-09-17 DIAGNOSIS — I441 Atrioventricular block, second degree: Secondary | ICD-10-CM | POA: Diagnosis not present

## 2015-09-18 NOTE — Progress Notes (Signed)
Remote pacemaker transmission.   

## 2015-10-03 LAB — CUP PACEART REMOTE DEVICE CHECK
Battery Remaining Longevity: 124 mo
Battery Remaining Percentage: 95.5 %
Brady Statistic AP VS Percent: 1 %
Brady Statistic RV Percent Paced: 99 %
Date Time Interrogation Session: 20170530063524
Implantable Lead Implant Date: 20150818
Implantable Lead Location: 753859
Lead Channel Impedance Value: 390 Ohm
Lead Channel Pacing Threshold Amplitude: 0.5 V
Lead Channel Pacing Threshold Amplitude: 0.625 V
Lead Channel Pacing Threshold Pulse Width: 0.5 ms
Lead Channel Setting Pacing Amplitude: 2 V
Lead Channel Setting Pacing Pulse Width: 0.5 ms
Lead Channel Setting Sensing Sensitivity: 4 mV
MDC IDC LEAD IMPLANT DT: 20150818
MDC IDC LEAD LOCATION: 753860
MDC IDC LEAD MODEL: 1948
MDC IDC MSMT BATTERY VOLTAGE: 3.02 V
MDC IDC MSMT LEADCHNL RA SENSING INTR AMPL: 5 mV
MDC IDC MSMT LEADCHNL RV IMPEDANCE VALUE: 790 Ohm
MDC IDC MSMT LEADCHNL RV PACING THRESHOLD PULSEWIDTH: 0.5 ms
MDC IDC MSMT LEADCHNL RV SENSING INTR AMPL: 11.8 mV
MDC IDC PG SERIAL: 7664739
MDC IDC SET LEADCHNL RV PACING AMPLITUDE: 0.875
MDC IDC STAT BRADY AP VP PERCENT: 18 %
MDC IDC STAT BRADY AS VP PERCENT: 82 %
MDC IDC STAT BRADY AS VS PERCENT: 1 %
MDC IDC STAT BRADY RA PERCENT PACED: 17 %

## 2015-10-07 IMAGING — US US CAROTID DUPLEX BILAT
1 series · 13 of 24 positions shown · non-contrast
Comparison: None.

CLINICAL DATA: syncope

EXAM:
BILATERAL CAROTID DUPLEX ULTRASOUND
TECHNIQUE: Gray scale imaging, color Doppler and duplex ultrasound was
performed of bilateral carotid and vertebral arteries in the neck.

[Series 1: us carotid duplex bilat · 0.05mm/px · 13 of 56 slices shown]
[im 1/56]
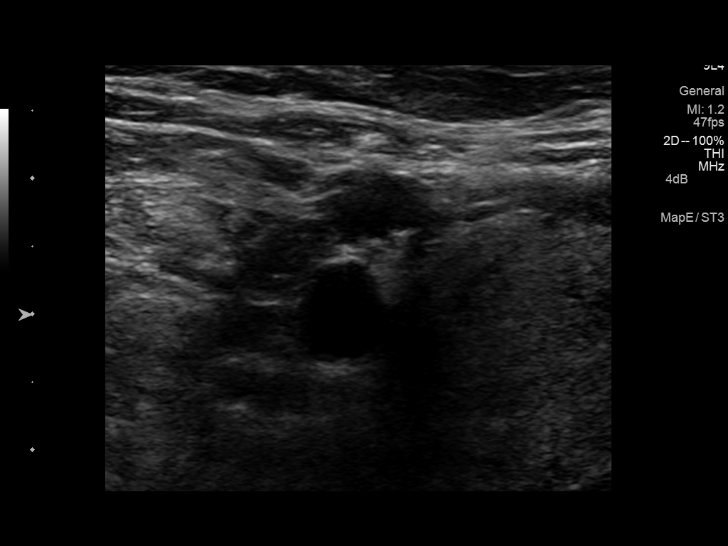
[im 5/56]
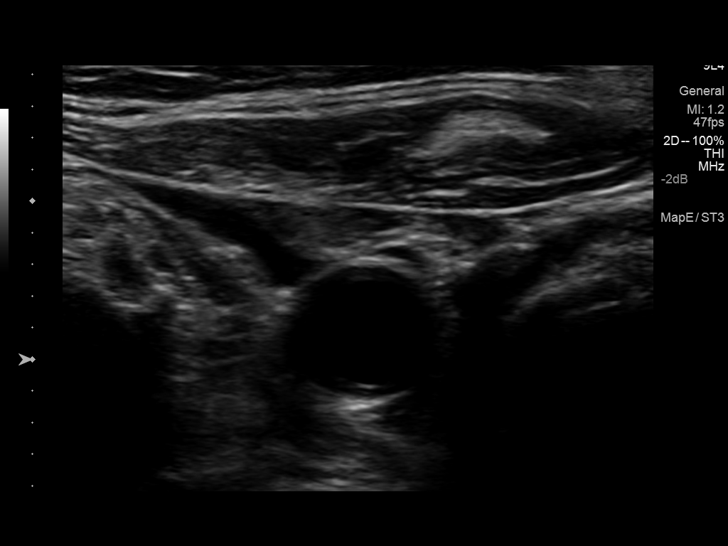
[im 10/56]
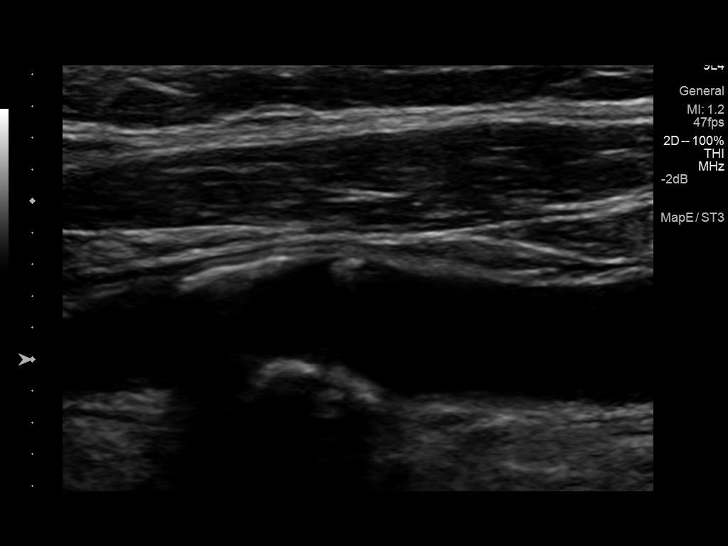
[im 15/56]
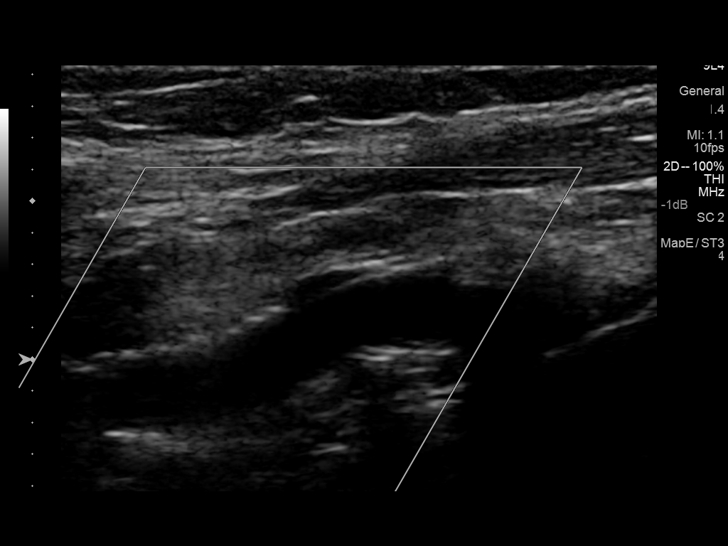
[im 20/56]
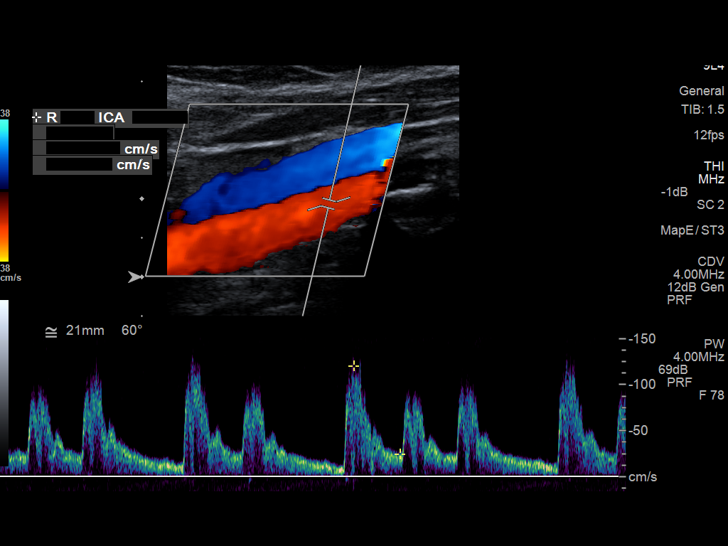
[im 24/56]
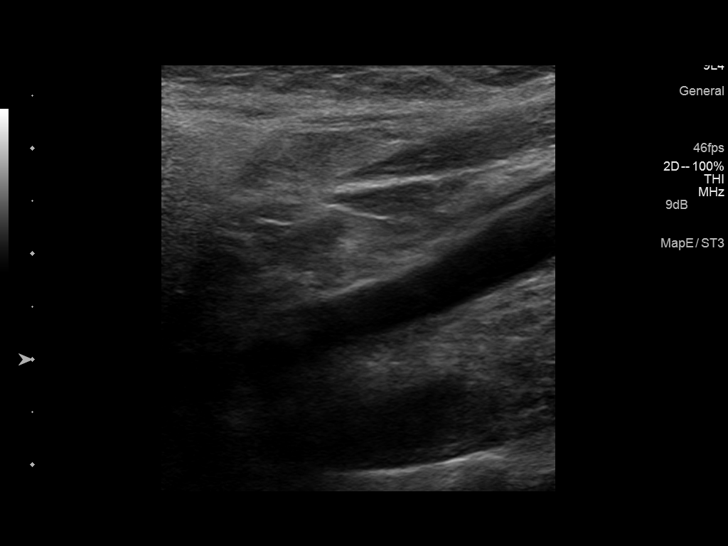
[im 29/56]
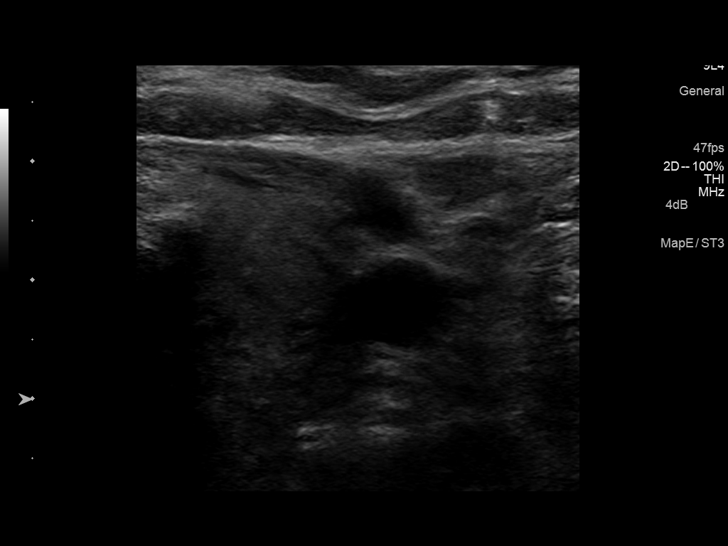
[im 32/56]
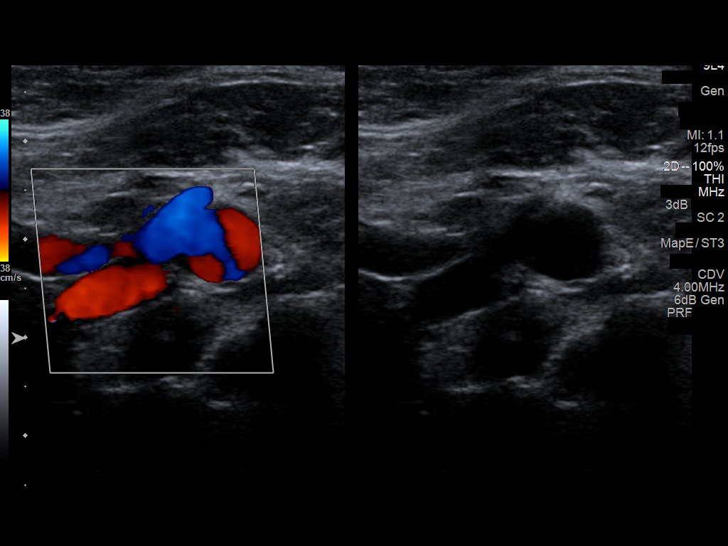
[im 36/56]
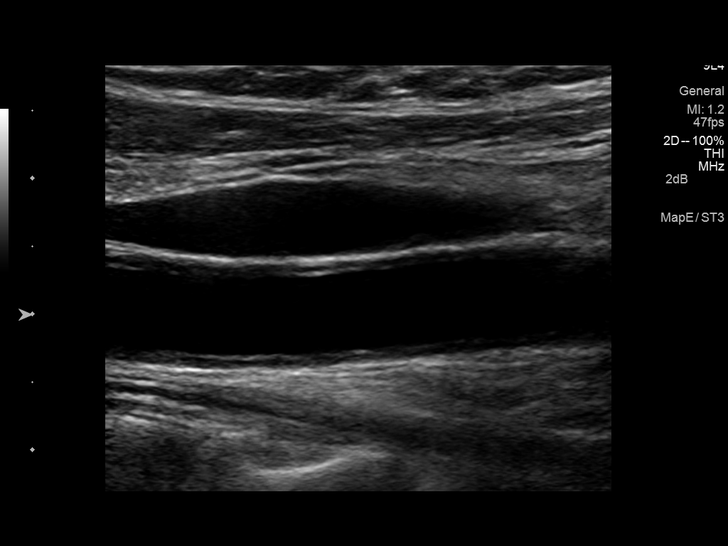
[im 41/56]
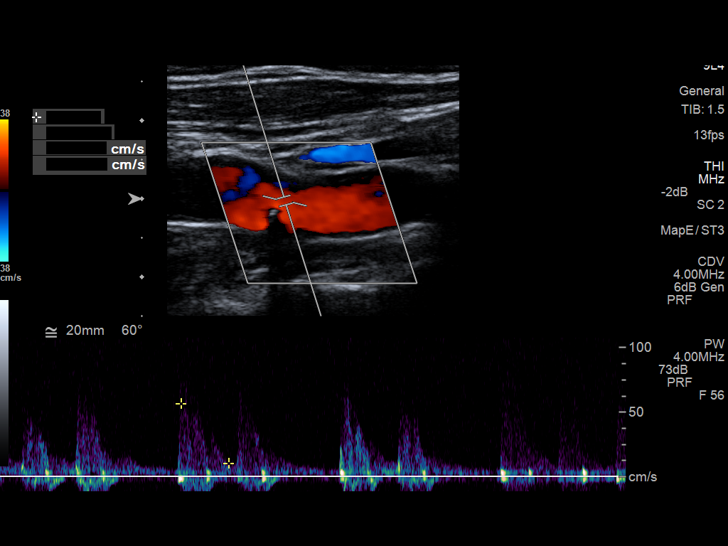
[im 46/56]
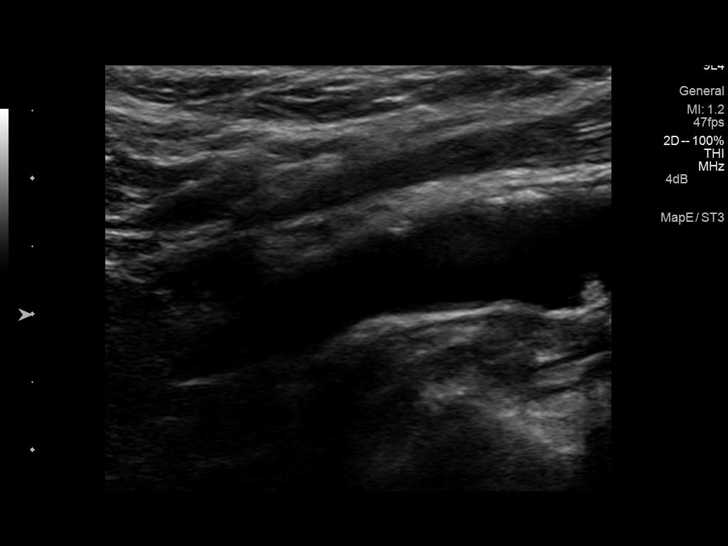
[im 51/56]
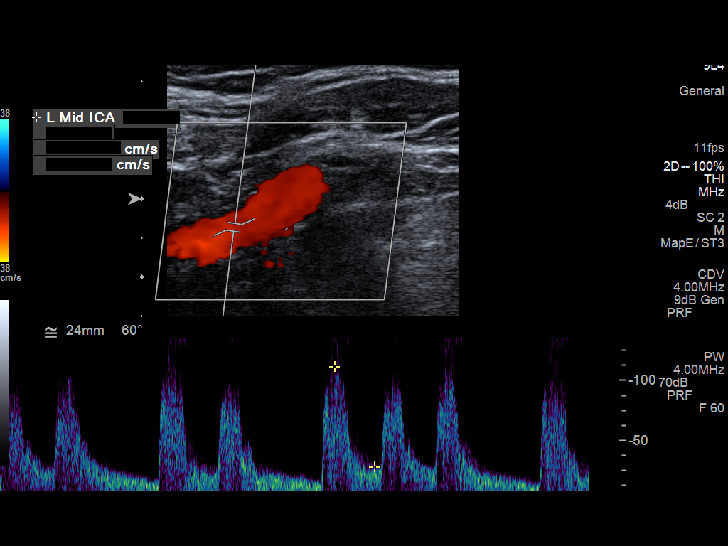
[im 56/56]
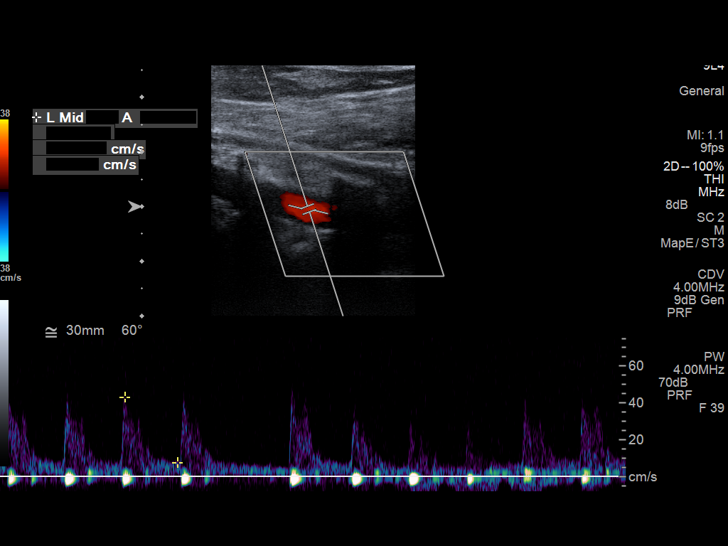

[13 of 24 positions shown; findings below may reference images not displayed]

REVIEW OF SYSTEMS:
Quantification of carotid stenosis is based on velocity parameters
that correlate the residual internal carotid diameter with
NASCET-based stenosis levels, using the diameter of the distal
internal carotid lumen as the denominator for stenosis measurement.

The following velocity measurements were obtained:

PEAK SYSTOLIC/END DIASTOLIC

RIGHT

ICA:                     131/28cm/sec

CCA:                     167/18cm/sec

SYSTOLIC ICA/CCA RATIO:

DIASTOLIC ICA/CCA RATIO:

ECA:                     270cm/sec

LEFT

ICA:                     111/27cm/sec

CCA:                     144/24cm/sec

SYSTOLIC ICA/CCA RATIO:

DIASTOLIC ICA/CCA RATIO:

ECA:                     215cm/sec
FINDINGS: RIGHT CAROTID ARTERY: Mild partially calcified plaque in the carotid
bulb and proximal ICA without high-grade stenosis. Normal waveforms
and color Doppler signal. A nonspecific cardiac arrhythmia noted
throughout the examination.

RIGHT VERTEBRAL ARTERY:  Normal flow direction and waveform.

LEFT CAROTID ARTERY: Intimal thickening in the common carotid
artery. There is mild plaque in the carotid bulb extending to the
ICA origin without significant stenosis. Normal waveforms and color
Doppler signal.

LEFT VERTEBRAL ARTERY: Normal flow direction and waveform.
IMPRESSION: 1. Mild bilateral carotid bifurcation and proximal ICA plaque
resulting in less than 50% diameter stenosis. The exam does not
exclude plaque ulceration or embolization. Continued surveillance
recommended.
2. Nonspecific cardiac arrhythmia.

## 2015-10-08 ENCOUNTER — Encounter: Payer: Self-pay | Admitting: Cardiology

## 2015-10-14 ENCOUNTER — Ambulatory Visit (INDEPENDENT_AMBULATORY_CARE_PROVIDER_SITE_OTHER): Payer: Medicare Other | Admitting: Family Medicine

## 2015-10-14 ENCOUNTER — Encounter: Payer: Self-pay | Admitting: Family Medicine

## 2015-10-14 VITALS — BP 135/60 | HR 74 | Temp 97.7°F | Resp 16 | Ht 67.5 in | Wt 178.0 lb

## 2015-10-14 DIAGNOSIS — I441 Atrioventricular block, second degree: Secondary | ICD-10-CM

## 2015-10-14 DIAGNOSIS — I10 Essential (primary) hypertension: Secondary | ICD-10-CM

## 2015-10-14 DIAGNOSIS — R7309 Other abnormal glucose: Secondary | ICD-10-CM | POA: Diagnosis not present

## 2015-10-14 DIAGNOSIS — Z950 Presence of cardiac pacemaker: Secondary | ICD-10-CM | POA: Diagnosis not present

## 2015-10-14 DIAGNOSIS — R739 Hyperglycemia, unspecified: Secondary | ICD-10-CM

## 2015-10-14 DIAGNOSIS — I2510 Atherosclerotic heart disease of native coronary artery without angina pectoris: Secondary | ICD-10-CM

## 2015-10-14 DIAGNOSIS — R7303 Prediabetes: Secondary | ICD-10-CM | POA: Insufficient documentation

## 2015-10-14 LAB — POCT GLYCOSYLATED HEMOGLOBIN (HGB A1C)

## 2015-10-14 NOTE — Progress Notes (Signed)
Name: Clinton Joseph   MRN: [id number]    DOB: 1927/08/08   Date:10/14/2015       Progress Note  Subjective  Chief Complaint  Chief Complaint  Patient presents with   Tremors   Hypertension    HPI Here for f/uy of HBP.  He has had some tremors in past, but not bothering him now.  His heart conditions  Are stable per Card.  His BS was elevated somewhat on l;ast CMP so will recheck A1c.  No problem-specific assessment & plan notes found for this encounter.   Past Medical History  Diagnosis Date   Anxiety state, unspecified    Tremor     a. right hand   Urticaria, unspecified    Hyperlipidemia    Diffuse large B cell lymphoma ([place])     a. 2007 s/p chemo - followed by Dr. Jeb Levering.   Arthritis     a. Hands.   Ventricular tachycardia (HCC)     a. 2012->broke with amio, neg w/u.   Non-obstructive CAD     a. 10/2010 Cath: >M nl, LAD/LCX min irregs, RCA 40p, EF 60%.   Mobitz II     a. 11/2013 syncope-->s/p SJM Assurity DR model [id number] (ser # [id number]).   H/O echocardiogram     a. 11/2013 Echo: EF 55-60%, mildly dil LA, mild to mod Ao sclerosis.   Hypertensive heart disease    Lymphoma [facility])     Past Surgical History  Procedure Laterality Date   Insert / replace / remove pacemaker  12/05/2013   Appendectomy  1948   Cardiac catheterization  11/17/2010    [facility]- LAD min irregs, LCX min irregs, RCA 40p, EF 60%.    Cataract extraction, bilateral Bilateral ~ 2012   Permanent pacemaker insertion N/A 12/05/2013    Procedure: PERMANENT PACEMAKER INSERTION;  Surgeon: Coralyn Mark, MD;  Location: [place] CATH LAB;  Service: Cardiovascular;  Laterality: N/A;    Family History  Problem Relation Age of Onset   Emphysema Father    Coronary artery disease Mother     Social History   Social History   Marital Status: Married    Spouse Name: N/A   Number of Children: N/A   Years of Education: N/A   Occupational History   accountant     retired   Social  History Main Topics   Smoking status: Never Smoker    Smokeless tobacco: Never Used   Alcohol Use: 1.2 oz/week    2 Glasses of wine per week   Drug Use: No   Sexual Activity: Yes   Other Topics Concern   Not on file   Social History Narrative     Current outpatient prescriptions:    aspirin 81 MG tablet, Take by mouth., Disp: , Rfl:    cetirizine (ZYRTEC) 10 MG tablet, Take 10 mg by mouth daily., Disp: , Rfl:    Cholecalciferol (VITAMIN D PO), Take 2 tablets by mouth daily., Disp: , Rfl:    enalapril (VASOTEC) 10 MG tablet, Take 1 tablet by mouth  daily, Disp: 90 tablet, Rfl: 3   ferrous sulfate 325 (65 FE) MG tablet, Take 325 mg by mouth daily with breakfast., Disp: , Rfl:    fluticasone (FLONASE) 50 MCG/ACT nasal spray, Place 2 sprays into both nostrils daily as needed for allergies or rhinitis. , Disp: , Rfl:    metoprolol succinate (TOPROL XL) 25 MG 24 hr tablet, Take 1/2 tablet by mouth each AM., Disp: 45 tablet, Rfl:  3   Multiple Vitamins-Minerals (MULTIVITAMIN WITH MINERALS) tablet, Take by mouth., Disp: , Rfl:    Omega-3 Fatty Acids (FISH OIL) 1000 MG CAPS, Take 1,200 mg by mouth 2 (two) times daily. , Disp: , Rfl:    Skin Protectants, Misc. (EUCERIN) cream, Apply 1 application topically daily as needed for dry skin. , Disp: , Rfl:    vitamin C (ASCORBIC ACID) 500 MG tablet, Take 500 mg by mouth daily., Disp: , Rfl:   Allergies  Allergen Reactions   Keflex [Cephalexin]     Diarrhea    Penicillins Hives and Rash   Tape Rash     Review of Systems  Constitutional: Negative for fever, chills, weight loss and malaise/fatigue.  HENT: Negative for hearing loss.   Eyes: Negative for blurred vision and double vision.  Respiratory: Negative for cough, shortness of breath and wheezing.   Cardiovascular: Negative for chest pain, palpitations, leg swelling and PND.  Gastrointestinal: Negative for heartburn, nausea, abdominal pain, constipation and blood in  stool.  Genitourinary: Negative for dysuria, urgency and frequency.  Skin: Negative for rash.  Neurological: Negative for dizziness, tremors, weakness and headaches.      Objective  Filed Vitals:   10/14/15 0922 10/14/15 0959  BP: 151/74 135/60  Pulse: 74   Temp: 97.7 F (36.5 C)   TempSrc: Oral   Resp: 16   Height: 5' 7.5" (1.715 m)   Weight: 178 lb (80.74 kg)     Physical Exam  Constitutional: He is oriented to person, place, and time and well-developed, well-nourished, and in no distress. No distress.  HENT:  Head: Normocephalic and atraumatic.  Eyes: Conjunctivae and EOM are normal. Pupils are equal, round, and reactive to light. No scleral icterus.  Neck: Normal range of motion. Neck supple. Carotid bruit is not present. No thyromegaly present.  Cardiovascular: Normal rate, regular rhythm and normal heart sounds.  Exam reveals no gallop and no friction rub.   No murmur heard. Pulmonary/Chest: Effort normal and breath sounds normal. No respiratory distress. He has no wheezes. He has no rales.  Abdominal: Soft. Bowel sounds are normal. He exhibits no distension, no abdominal bruit and no mass. There is no tenderness.  Musculoskeletal: Normal range of motion. He exhibits no edema.  Lymphadenopathy:    He has no cervical adenopathy.  Neurological: He is alert and oriented to person, place, and time.  Vitals reviewed.      Recent Results (from the past 2160 hour(s))  CBC with Differential/Platelet     Status: Abnormal   Collection Time: 07/29/15 12:00 AM  Result Value Ref Range   WBC 5.4 3.4 - 10.8 x10E3/uL   RBC 3.99 (L) 4.14 - 5.80 x10E6/uL   Hemoglobin 12.4 (L) 12.6 - 17.7 g/dL   Hematocrit 37.2 (L) 37.5 - 51.0 %   MCV 93 79 - 97 fL   MCH 31.1 26.6 - 33.0 pg   MCHC 33.3 31.5 - 35.7 g/dL   RDW 13.7 12.3 - 15.4 %   Platelets 128 (L) 150 - 379 x10E3/uL   Neutrophils 58 %   Lymphs 32 %   Monocytes 7 %   Eos 2 %   Basos 1 %   Neutrophils Absolute 3.1 1.4 -  7.0 x10E3/uL   Lymphocytes Absolute 1.8 0.7 - 3.1 x10E3/uL   Monocytes Absolute 0.4 0.1 - 0.9 x10E3/uL   EOS (ABSOLUTE) 0.1 0.0 - 0.4 x10E3/uL   Basophils Absolute 0.0 0.0 - 0.2 x10E3/uL   Immature Granulocytes 0 %  Immature Grans (Abs) 0.0 0.0 - 0.1 x10E3/uL  Comprehensive metabolic panel     Status: Abnormal   Collection Time: 07/29/15 12:00 AM  Result Value Ref Range   Glucose 109 (H) 65 - 99 mg/dL   BUN 22 8 - 27 mg/dL   Creatinine, Ser 1.26 0.76 - 1.27 mg/dL   GFR calc non Af Amer 52 (L) >59 mL/min/1.73   GFR calc Af Amer 60 >59 mL/min/1.73   BUN/Creatinine Ratio 17 10 - 24   Sodium 144 134 - 144 mmol/L   Potassium 6.0 (H) 3.5 - 5.2 mmol/L   Chloride 101 96 - 106 mmol/L   CO2 25 18 - 29 mmol/L   Calcium 10.0 8.6 - 10.2 mg/dL   Total Protein 6.7 6.0 - 8.5 g/dL   Albumin 4.3 3.5 - 4.7 g/dL   Globulin, Total 2.4 1.5 - 4.5 g/dL   Albumin/Globulin Ratio 1.8 1.2 - 2.2   Bilirubin Total 0.5 0.0 - 1.2 mg/dL   Alkaline Phosphatase 52 39 - 117 IU/L   AST 42 (H) 0 - 40 IU/L   ALT 37 0 - 44 IU/L  POCT Influenza A/B     Status: Normal   Collection Time: 07/29/15 10:43 AM  Result Value Ref Range   Influenza A, POC Negative Negative   Influenza B, POC Negative Negative  Basic Metabolic Panel (BMET)     Status: Abnormal   Collection Time: 08/02/15 10:08 AM  Result Value Ref Range   Glucose 100 (H) 65 - 99 mg/dL   BUN 32 (H) 8 - 27 mg/dL   Creatinine, Ser 1.25 0.76 - 1.27 mg/dL   GFR calc non Af Amer 52 (L) >59 mL/min/1.73   GFR calc Af Amer 60 >59 mL/min/1.73   BUN/Creatinine Ratio 26 (H) 10 - 24   Sodium 143 134 - 144 mmol/L   Potassium 4.3 3.5 - 5.2 mmol/L   Chloride 99 96 - 106 mmol/L   CO2 26 18 - 29 mmol/L   Calcium 9.9 8.6 - 10.2 mg/dL  CBC with Differential/Platelet     Status: Abnormal   Collection Time: 08/07/15  1:41 PM  Result Value Ref Range   WBC 5.9 3.8 - 10.6 K/uL   RBC 4.16 (L) 4.40 - 5.90 MIL/uL   Hemoglobin 13.2 13.0 - 18.0 g/dL   HCT 38.5 (L) 40.0 - 52.0  %   MCV 92.6 80.0 - 100.0 fL   MCH 31.7 26.0 - 34.0 pg   MCHC 34.3 32.0 - 36.0 g/dL   RDW 12.7 11.5 - 14.5 %   Platelets 113 (L) 150 - 440 K/uL   Neutrophils Relative % 61 %   Neutro Abs 3.6 1.4 - 6.5 K/uL   Lymphocytes Relative 28 %   Lymphs Abs 1.7 1.0 - 3.6 K/uL   Monocytes Relative 6 %   Monocytes Absolute 0.4 0.2 - 1.0 K/uL   Eosinophils Relative 4 %   Eosinophils Absolute 0.2 0 - 0.7 K/uL   Basophils Relative 1 %   Basophils Absolute 0.0 0 - 0.1 K/uL  Comprehensive metabolic panel     Status: Abnormal   Collection Time: 08/07/15  1:41 PM  Result Value Ref Range   Sodium 139 135 - 145 mmol/L   Potassium 4.4 3.5 - 5.1 mmol/L   Chloride 105 101 - 111 mmol/L   CO2 29 22 - 32 mmol/L   Glucose, Bld 128 (H) 65 - 99 mg/dL   BUN 28 (H) 6 - 20 mg/dL  Creatinine, Ser 1.32 (H) 0.61 - 1.24 mg/dL   Calcium 9.8 8.9 - 10.3 mg/dL   Total Protein 7.3 6.5 - 8.1 g/dL   Albumin 4.3 3.5 - 5.0 g/dL   AST 39 15 - 41 U/L   ALT 37 17 - 63 U/L   Alkaline Phosphatase 49 38 - 126 U/L   Total Bilirubin 0.7 0.3 - 1.2 mg/dL   GFR calc non Af Amer 47 (L) >60 mL/min   GFR calc Af Amer 55 (L) >60 mL/min    Comment: (NOTE) The eGFR has been calculated using the CKD EPI equation. This calculation has not been validated in all clinical situations. eGFR's persistently <60 mL/min signify possible Chronic Kidney Disease.    Anion gap 5 5 - 15  Lactate dehydrogenase     Status: None   Collection Time: 08/07/15  1:41 PM  Result Value Ref Range   LDH 146 98 - 192 U/L  Implantable device - remote     Status: None   Collection Time: 09/17/15  6:35 AM  Result Value Ref Range   Date Time Interrogation Session [id number]    Pulse Generator Manufacturer [employer]    Pulse Gen Model 2240 Assurity DR    Pulse Gen Serial Number [id number]    Implantable Pulse Generator Type Implantable Pulse Generator    Implantable Pulse Generator Implant Date [id number]    Implantable Lead Manufacturer [employer] [facility]     Implantable Lead Model 1946    Implantable Lead Serial Number [id number]    Implantable Lead Implant Date 85631497    Implantable Lead Location [id number]    Implantable Lead Manufacturer [facility]    Implantable Lead Model [phone] Tendril STS    Implantable Lead Serial Number [id number]    Implantable Lead Implant Date [id number]    Implantable Lead Location [id number]    Lead Channel Setting Sensing Sensitivity 4.0 mV   Lead Channel Setting Sensing Adaptation Mode Fixed Pacing    Lead Channel Setting Pacing Amplitude 2.0 V   Lead Channel Setting Pacing Pulse Width 0.5 ms   Lead Channel Setting Pacing Amplitude 0.875    Lead Channel Status     Lead Channel Impedance Value 390 ohm   Lead Channel Sensing Intrinsic Amplitude 5.0 mV   Lead Channel Pacing Threshold Amplitude 0.5 V   Lead Channel Pacing Threshold Pulse Width 0.5 ms   Lead Channel Status     Lead Channel Impedance Value 790 ohm   Lead Channel Sensing Intrinsic Amplitude 11.8 mV   Lead Channel Pacing Threshold Amplitude 0.625 V   Lead Channel Pacing Threshold Pulse Width 0.5 ms   Battery Status MOS    Battery Remaining Longevity 124 mo   Battery Remaining Percentage 95.5 %   Battery Voltage 3.02 V   Brady Statistic RA Percent Paced 17.0 %   Brady Statistic RV Percent Paced 99.0 %   Brady Statistic AP VP Percent 18.0 %   Brady Statistic AS VP Percent 82.0 %   Brady Statistic AP VS Percent 1.0 %   Brady Statistic AS VS Percent 1.0 %   Eval Rhythm AsVp      Assessment & Plan  Problem List Items Addressed This Visit      Cardiovascular and Mediastinum   Essential hypertension   Second degree Mobitz II AV block   Arteriosclerosis of coronary artery     Other   Pacemaker   Elevated blood sugar - Primary   Relevant Orders   POCT HgB A1C   Pre-diabetes  No orders of the defined types were placed in this encounter.   1. Elevated blood sugar  - POCT HgB A1C-6.3  2. Essential hypertension Cont. Metoprolol and  Vasotec.  3. Arteriosclerosis of coronary artery   4. Second degree Mobitz II AV block   5. Pre-diabetes Discussed reducing sugars and starches in diet.

## 2015-12-02 ENCOUNTER — Other Ambulatory Visit: Payer: Self-pay | Admitting: Cardiovascular Disease

## 2015-12-17 ENCOUNTER — Encounter: Payer: Self-pay | Admitting: Internal Medicine

## 2015-12-17 ENCOUNTER — Ambulatory Visit (INDEPENDENT_AMBULATORY_CARE_PROVIDER_SITE_OTHER): Payer: Medicare Other | Admitting: Internal Medicine

## 2015-12-17 VITALS — BP 140/66 | HR 59 | Ht 68.0 in | Wt 176.8 lb

## 2015-12-17 DIAGNOSIS — I442 Atrioventricular block, complete: Secondary | ICD-10-CM | POA: Diagnosis not present

## 2015-12-17 DIAGNOSIS — Z950 Presence of cardiac pacemaker: Secondary | ICD-10-CM | POA: Diagnosis not present

## 2015-12-17 DIAGNOSIS — I10 Essential (primary) hypertension: Secondary | ICD-10-CM

## 2015-12-17 DIAGNOSIS — I471 Supraventricular tachycardia: Secondary | ICD-10-CM

## 2015-12-17 LAB — CUP PACEART INCLINIC DEVICE CHECK
Brady Statistic RA Percent Paced: 18 %
Date Time Interrogation Session: 20170829141906
Implantable Lead Implant Date: 20150818
Implantable Lead Location: 753859
Implantable Lead Model: 1948
Lead Channel Impedance Value: 762.5 Ohm
Lead Channel Pacing Threshold Amplitude: 0.5 V
Lead Channel Pacing Threshold Amplitude: 0.625 V
Lead Channel Pacing Threshold Pulse Width: 0.5 ms
Lead Channel Pacing Threshold Pulse Width: 0.5 ms
Lead Channel Sensing Intrinsic Amplitude: 12 mV
Lead Channel Sensing Intrinsic Amplitude: 5 mV
Lead Channel Setting Pacing Amplitude: 2 V
Lead Channel Setting Pacing Pulse Width: 0.5 ms
Lead Channel Setting Sensing Sensitivity: 4 mV
MDC IDC LEAD IMPLANT DT: 20150818
MDC IDC LEAD LOCATION: 753860
MDC IDC MSMT BATTERY REMAINING LONGEVITY: 127.2
MDC IDC MSMT BATTERY VOLTAGE: 3.02 V
MDC IDC MSMT LEADCHNL RA IMPEDANCE VALUE: 412.5 Ohm
MDC IDC SET LEADCHNL RV PACING AMPLITUDE: 0.875
MDC IDC STAT BRADY RV PERCENT PACED: 99.94 %
Pulse Gen Serial Number: 7664739

## 2015-12-17 NOTE — Progress Notes (Signed)
Patient Care Team: Arlis Porta., MD as PCP - General (Family Medicine)   HPI  Clinton Joseph is a 82 y.o. male Seen in follow-up for pacemaker implanted 8/15 by Dr. Greggory Brandy for syncope and Mobitz 2 heart block and intermittent complete heart block and pauses of greater than 6 seconds.  He has mildly obstructive coronary disease by cath 2012. Echocardiogram 8/15 demonstrated EF of 55-60% with aortic sclerosis without stenosis.  The patient denies chest pain, shortness of breath, nocturnal dyspnea, orthopnea or peripheral edema.  There have been no palpitations, lightheadedness or syncope.    Past Medical History:  Diagnosis Date  . Anxiety state, unspecified   . Arthritis    a. Hands.  . Diffuse large B cell lymphoma ([place])    a. 2007 s/p chemo - followed by Dr. Jeb Levering.  . H/O echocardiogram    a. 11/2013 Echo: EF 55-60%, mildly dil LA, mild to mod Ao sclerosis.  . Hyperlipidemia   . Hypertensive heart disease   . Lymphoma ([place])   . Mobitz II    a. 11/2013 syncope-->s/p SJM Assurity DR model [id number] (ser # [id number]).  . Non-obstructive CAD    a. 10/2010 Cath: >M nl, LAD/LCX min irregs, RCA 40p, EF 60%.  . Tremor    a. right hand  . Urticaria, unspecified   . Ventricular tachycardia (HCC)    a. 2012->broke with amio, neg w/u.    Past Surgical History:  Procedure Laterality Date  . APPENDECTOMY  1948  . CARDIAC CATHETERIZATION  11/17/2010   [facility]- LAD min irregs, LCX min irregs, RCA 40p, EF 60%.   Marland Kitchen CATARACT EXTRACTION, BILATERAL Bilateral ~ 2012  . INSERT / REPLACE / REMOVE PACEMAKER  12/05/2013  . PERMANENT PACEMAKER INSERTION N/A 12/05/2013   Procedure: PERMANENT PACEMAKER INSERTION;  Surgeon: Coralyn Mark, MD;  Location: [place] CATH LAB;  Service: Cardiovascular;  Laterality: N/A;    Current Outpatient Prescriptions  Medication Sig Dispense Refill  . aspirin 81 MG tablet Take by mouth.    . cetirizine (ZYRTEC) 10 MG tablet Take 10 mg by mouth daily.    .  Cholecalciferol (VITAMIN D PO) Take 2 tablets by mouth daily.    . enalapril (VASOTEC) 10 MG tablet Take 1 tablet by mouth  daily 90 tablet 3  . ferrous sulfate 325 (65 FE) MG tablet Take 325 mg by mouth daily with breakfast.    . fluticasone (FLONASE) 50 MCG/ACT nasal spray Place 2 sprays into both nostrils daily as needed for allergies or rhinitis.     . metoprolol succinate (TOPROL XL) 25 MG 24 hr tablet Take 1/2 tablet by mouth each AM. 45 tablet 3  . Multiple Vitamins-Minerals (MULTIVITAMIN WITH MINERALS) tablet Take by mouth.    . Omega-3 Fatty Acids (FISH OIL) 1000 MG CAPS Take 1,200 mg by mouth 2 (two) times daily.     . Skin Protectants, Misc. (EUCERIN) cream Apply 1 application topically daily as needed for dry skin.     Marland Kitchen vitamin C (ASCORBIC ACID) 500 MG tablet Take 500 mg by mouth daily.     No current facility-administered medications for this visit.     Allergies  Allergen Reactions  . Keflex [Cephalexin]     Diarrhea   . Penicillins Hives and Rash  . Tape Rash    Review of Systems negative except from HPI and PMH  Physical Exam BP 140/66 (BP Location: Left Arm, Patient Position: Sitting, Cuff Size: Normal)  Pulse (!) 59   Ht 5\' 8"  (1.727 m)   Wt 176 lb 12 oz (80.2 kg)   BMI 26.87 kg/m  Well developed and well nourished in no acute distress HENT normal E scleral and icterus clear Neck Supple JVP flat; carotids brisk and full Clear to ausculation Device pocket well healed; without hematoma or erythema.  There is no tethering Regular rate and rhythm, no murmurs gallops or rub Soft with active bowel sounds No clubbing cyanosis  Edema Alert and oriented, grossly normal motor and sensory function Skin Warm and Dry  ECG was ordered today demonstrated synchronous pacing  Assessment and  Plan  Complete heart block now with some intrinsic conduction with prolonged PR interval  Pacemaker-St. Jude The patient's device was interrogated and the information was fully  reviewed.  The device was reprogrammed to Allow for intrinsic conduction with an AV interval of 350 ms.  this will likely give rise to pseudofusion  Syncope  No recurrent syncope  Hypertension  Atrial tachycardia  The patient has become device dependent. His device was reprogrammed to maximize longevity  Otherwise stable  BP well controlled    WILL STOP METOPROLOL

## 2015-12-17 NOTE — Patient Instructions (Signed)
Medication Instructions: - Your physician has recommended you make the following change in your medication:  1) Stop metoprolol succinate  Labwork: - none today  Procedures/Testing: - none today  Follow-Up: - Remote monitoring is used to monitor your Pacemaker of ICD from home. This monitoring reduces the number of office visits required to check your device to one time per year. It allows [place] to keep an eye on the functioning of your device to ensure it is working properly. You are scheduled for a device check from home on 03/17/16. You may send your transmission at any time that day. If you have a wireless device, the transmission will be sent automatically. After your physician reviews your transmission, you will receive a postcard with your next transmission date.  - Your physician wants you to follow-up in: 1 year with Dr. Caryl Comes. You will receive a reminder letter in the mail two months in advance. If you don't receive a letter, please call our office to schedule the follow-up appointment.  Any Additional Special Instructions Will Be Listed Below (If Applicable).     If you need a refill on your cardiac medications before your next appointment, please call your pharmacy.

## 2015-12-24 ENCOUNTER — Telehealth: Payer: Self-pay | Admitting: Internal Medicine

## 2015-12-24 NOTE — Telephone Encounter (Signed)
Pt is calling back with BP reading: 145/85

## 2015-12-24 NOTE — Telephone Encounter (Signed)
Spoke with SK regarding patient complaints. Can bring pt in and program to previous values per SK. When I called the patient and informed him of this he stated that he feels his ShOB is related to allergies and the fact that he didn't take his allergy medicine the last couple days. He also reported that he hasn't had any additional dizziness since his episode on Sunday. He asked if it was safe to wait a couple of days and see how he felt. I explained that the change was originally made for his benefit and that if he was feeling poorly related to that change we would reprogram his device back to original settings. But if he felt like his symptoms were isolated and unrelated then I encouraged him to wait a couple more days and see how he felt. The patient agreed with this and will call if his symptoms return or worsen.

## 2015-12-24 NOTE — Telephone Encounter (Signed)
Wife reports that since last OV pt reports intermittent dizziness consistently between 10am at 11:30am. She said he additionally has noticed ShOB with activity which was not present prior to last OV. She states that these symptoms directly mirror those of his symptoms prior to pacemaker implant. Will call back with BP results

## 2015-12-24 NOTE — Telephone Encounter (Signed)
Pt spouse calling stating last week we turned down pt device and since then he is dizzy.  Would like to know if we can add him on today and get pt checked out. Please advise.

## 2015-12-30 ENCOUNTER — Encounter: Payer: Self-pay | Admitting: Internal Medicine

## 2016-01-29 ENCOUNTER — Ambulatory Visit (INDEPENDENT_AMBULATORY_CARE_PROVIDER_SITE_OTHER): Payer: Medicare Other

## 2016-01-29 DIAGNOSIS — Z23 Encounter for immunization: Secondary | ICD-10-CM

## 2016-01-30 ENCOUNTER — Encounter: Payer: Self-pay | Admitting: Family Medicine

## 2016-01-30 ENCOUNTER — Other Ambulatory Visit (HOSPITAL_COMMUNITY): Payer: Self-pay | Admitting: *Deleted

## 2016-01-30 ENCOUNTER — Ambulatory Visit (INDEPENDENT_AMBULATORY_CARE_PROVIDER_SITE_OTHER): Payer: Medicare Other | Admitting: Family Medicine

## 2016-01-30 VITALS — BP 130/62 | HR 65 | Temp 98.1°F | Resp 16 | Ht 68.0 in | Wt 178.0 lb

## 2016-01-30 DIAGNOSIS — I2510 Atherosclerotic heart disease of native coronary artery without angina pectoris: Secondary | ICD-10-CM

## 2016-01-30 DIAGNOSIS — E119 Type 2 diabetes mellitus without complications: Secondary | ICD-10-CM | POA: Diagnosis not present

## 2016-01-30 DIAGNOSIS — I10 Essential (primary) hypertension: Secondary | ICD-10-CM

## 2016-01-30 LAB — POCT GLYCOSYLATED HEMOGLOBIN (HGB A1C): Hemoglobin A1C: 5.8

## 2016-01-30 NOTE — Progress Notes (Signed)
Name: Clinton Joseph   MRN: [id number]    DOB: Jun 26, 1927   Date:01/30/2016       Progress Note  Subjective  Chief Complaint  Chief Complaint  Patient presents with  . Diabetes  . Hypertension    HPI Here for f/u of HBP and Pre-diabetes.  He is feeling well.  His card.  Has discontinued his B blocker.    No problem-specific Assessment & Plan notes found for this encounter.   Past Medical History:  Diagnosis Date  . Anxiety state, unspecified   . Arthritis    a. Hands.  . Diffuse large B cell lymphoma ([place])    a. 2007 s/p chemo - followed by Dr. Jeb Levering.  . H/O echocardiogram    a. 11/2013 Echo: EF 55-60%, mildly dil LA, mild to mod Ao sclerosis.  . Hyperlipidemia   . Hypertensive heart disease   . Lymphoma ([place])   . Mobitz II    a. 11/2013 syncope-->s/p SJM Assurity DR model V7204091 (ser # [id number]).  . Non-obstructive CAD    a. 10/2010 Cath: >M nl, LAD/LCX min irregs, RCA 40p, EF 60%.  . Tremor    a. right hand  . Urticaria, unspecified   . Ventricular tachycardia (HCC)    a. 2012->broke with amio, neg w/u.    Past Surgical History:  Procedure Laterality Date  . APPENDECTOMY  1948  . CARDIAC CATHETERIZATION  11/17/2010   [facility]- LAD min irregs, LCX min irregs, RCA 40p, EF 60%.   Marland Kitchen CATARACT EXTRACTION, BILATERAL Bilateral ~ 2012  . INSERT / REPLACE / REMOVE PACEMAKER  12/05/2013  . PERMANENT PACEMAKER INSERTION N/A 12/05/2013   Procedure: PERMANENT PACEMAKER INSERTION;  Surgeon: Coralyn Mark, MD;  Location: [facility]ATH [facility];  Service: Cardiovascular;  Laterality: N/A;    Family History  Problem Relation Age of Onset  . Coronary artery disease Mother   . Emphysema Father     Social History   Social History  . Marital status: Married    Spouse name: N/A  . Number of children: N/A  . Years of education: N/A   Occupational History  . accountant     retired   Social History Main Topics  . Smoking status: Never Smoker  . Smokeless tobacco: Never Used  .  Alcohol use 1.2 oz/week    2 Glasses of wine per week  . Drug use: No  . Sexual activity: Yes   Other Topics Concern  . Not on file   Social History Narrative  . No narrative on file     Current Outpatient Prescriptions:  .  aspirin 81 MG tablet, Take by mouth., Disp: , Rfl:  .  cetirizine (ZYRTEC) 10 MG tablet, Take 10 mg by mouth daily., Disp: , Rfl:  .  Cholecalciferol (VITAMIN D PO), Take 2 tablets by mouth daily., Disp: , Rfl:  .  enalapril (VASOTEC) 10 MG tablet, Take 1 tablet by mouth  daily, Disp: 90 tablet, Rfl: 3 .  ferrous sulfate 325 (65 FE) MG tablet, Take 325 mg by mouth daily with breakfast., Disp: , Rfl:  .  fluticasone (FLONASE) 50 MCG/ACT nasal spray, Place 2 sprays into both nostrils daily as needed for allergies or rhinitis. , Disp: , Rfl:  .  Multiple Vitamins-Minerals (MULTIVITAMIN WITH MINERALS) tablet, Take by mouth., Disp: , Rfl:  .  Omega-3 Fatty Acids (FISH OIL) 1000 MG CAPS, Take 1,200 mg by mouth 2 (two) times daily. , Disp: , Rfl:  .  Skin Protectants,  Misc. (EUCERIN) cream, Apply 1 application topically daily as needed for dry skin. , Disp: , Rfl:  .  vitamin C (ASCORBIC ACID) 500 MG tablet, Take 500 mg by mouth daily., Disp: , Rfl:   Allergies  Allergen Reactions  . Keflex [Cephalexin]     Diarrhea   . Penicillins Hives and Rash  . Tape Rash     Review of Systems  Constitutional: Negative for chills, fever, malaise/fatigue and weight loss.  HENT: Negative for hearing loss.   Eyes: Negative for blurred vision and double vision.  Respiratory: Negative for cough, shortness of breath and wheezing.   Cardiovascular: Negative for chest pain, palpitations and leg swelling.  Gastrointestinal: Negative for abdominal pain, blood in stool and heartburn.  Genitourinary: Negative for dysuria, frequency and urgency.  Musculoskeletal: Negative for joint pain and myalgias.  Skin: Negative for rash.  Neurological: Negative for dizziness, tremors, weakness  and headaches.      Objective  Vitals:   01/30/16 0931  BP: 130/62  Pulse: 65  Resp: 16  Temp: 98.1 F (36.7 C)  TempSrc: Oral  Weight: 80.7 kg (178 lb)  Height: 5\' 8"  (1.727 m)    Physical Exam  Constitutional: He is oriented to person, place, and time and well-developed, well-nourished, and in no distress. No distress.  HENT:  Head: Normocephalic and atraumatic.  Eyes: Conjunctivae and EOM are normal. Pupils are equal, round, and reactive to light. No scleral icterus.  Neck: Normal range of motion. Carotid bruit is not present. No thyromegaly present.  Cardiovascular: Normal rate, regular rhythm and normal heart sounds.  Exam reveals no gallop and no friction rub.   No murmur heard. Pulmonary/Chest: Effort normal and breath sounds normal. No respiratory distress. He has no wheezes. He has no rales. He exhibits no tenderness.  Abdominal: Soft. Bowel sounds are normal. He exhibits no distension and no mass. There is no tenderness.  Musculoskeletal: He exhibits no edema.  Lymphadenopathy:    He has no cervical adenopathy.  Neurological: He is alert and oriented to person, place, and time.  Vitals reviewed.      Recent Results (from the past 2160 hour(s))  Implantable device check     Status: None   Collection Time: 12/17/15  9:08 PM  Result Value Ref Range   Date Time Interrogation Session [id number]    Pulse Generator Manufacturer [employer]    Pulse Gen Model 2240 Assurity DR    Pulse Gen Serial Number [id number]    Implantable Pulse Generator Type Implantable Pulse Generator    Implantable Pulse Generator Implant Date [id number]    Implantable Lead Manufacturer [facility]    Implantable Lead Model 1946    Implantable Lead Serial Number [id number]    Implantable Lead Implant Date CI:1012718    Implantable Lead Location [id number]    Implantable Lead Manufacturer [facility]    Implantable Lead Model [id number] Tendril STS    Implantable Lead Serial Number [id number]     Implantable Lead Implant Date CI:1012718    Implantable Lead Location [id number]    Lead Channel Setting Sensing Sensitivity 4.0 mV   Lead Channel Setting Pacing Amplitude 2.0 V   Lead Channel Setting Pacing Pulse Width 0.5 ms   Lead Channel Setting Pacing Amplitude 0.875    Lead Channel Impedance Value 412.5 ohm   Lead Channel Sensing Intrinsic Amplitude 5.0 mV   Lead Channel Pacing Threshold Amplitude 0.5 V   Lead Channel Pacing Threshold Pulse Width 0.5 ms   Lead Channel Impedance Value  762.5 ohm   Lead Channel Sensing Intrinsic Amplitude 12.0 mV   Lead Channel Pacing Threshold Amplitude 0.625 V   Lead Channel Pacing Threshold Pulse Width 0.5 ms   Battery Status Unknown    Battery Remaining Longevity 127.2    Battery Voltage 3.02 V   Brady Statistic RA Percent Paced 18.0 %   Brady Statistic RV Percent Paced 99.94 %   Eval Rhythm SB with first degree HB   POCT HgB A1C     Status: Normal   Collection Time: 01/30/16  9:48 AM  Result Value Ref Range   Hemoglobin A1C 5.8%      Assessment & Plan  Problem List Items Addressed This Visit      Cardiovascular and Mediastinum   Essential hypertension   Arteriosclerosis of coronary artery     Endocrine   Diabetes mellitus without complication ([place]) - Primary   Relevant Orders   POCT HgB A1C (Completed)    Other Visit Diagnoses   None.     Meds ordered this encounter  Medications  . DISCONTD: metoprolol succinate (TOPROL-XL) 25 MG 24 hr tablet    Sig: Take 25 mg by mouth daily.   1. Diabetes mellitus without complication (HCC)  - POCT HgB A1C-5.8  2. Essential hypertension Cont Vasotec.  3. Arteriosclerosis of coronary artery contg to see Dr. Caryl Comes

## 2016-02-18 ENCOUNTER — Encounter: Payer: Self-pay | Admitting: Cardiovascular Disease

## 2016-02-18 ENCOUNTER — Ambulatory Visit (INDEPENDENT_AMBULATORY_CARE_PROVIDER_SITE_OTHER): Payer: Medicare Other | Admitting: Cardiovascular Disease

## 2016-02-18 VITALS — BP 160/84 | HR 75 | Ht 68.0 in | Wt 178.1 lb

## 2016-02-18 DIAGNOSIS — I442 Atrioventricular block, complete: Secondary | ICD-10-CM

## 2016-02-18 DIAGNOSIS — R000 Tachycardia, unspecified: Secondary | ICD-10-CM

## 2016-02-18 DIAGNOSIS — I2510 Atherosclerotic heart disease of native coronary artery without angina pectoris: Secondary | ICD-10-CM | POA: Diagnosis not present

## 2016-02-18 DIAGNOSIS — I10 Essential (primary) hypertension: Secondary | ICD-10-CM | POA: Diagnosis not present

## 2016-02-18 DIAGNOSIS — I472 Ventricular tachycardia, unspecified: Secondary | ICD-10-CM

## 2016-02-18 NOTE — Patient Instructions (Signed)

## 2016-02-18 NOTE — Progress Notes (Signed)
Cardiology Office Note  Date:  02/18/2016   ID:  [name], [name] 1928-04-11, MRN [id number]  PCP:  Dicky Doe, MD   Chief Complaint  Patient presents with  . Hypertension  . Complete Heart Block    HPI:  [name] is a very pleasant 82 year old gentleman  Prior history of lymphoma 2006 with chemotherapy x7 cycles at that time. Notes indicating run of VT, sustained with presentation to the emergency room in 2012. Rate was 150 beats per minute lasting more than 3 seconds. He was given IV amiodarone and converted to normal sinus rhythm. Evaluation at that time showing nonobstructive CAD, normal ejection fraction. History of hypertension  last seen by myself in May 2015,  Since then had bradycardia, heart block in August 2015 requiring pacemaker  He presents for follow-up of his arrhythmia  patient of Dr. [name]  In general he feels well, has been very active Wife with foot in a boot after surgery, foot fracture, he has been doing all the chores Denies any palpitations concerning for tachycardia  denies having any episodes of dizziness  EKG today  shows atrial paced rhythm , 60 bpm   other past medical history Prior cardiac catheterization aug 2015 showed 40% proximal RCA disease, minimal mid left circumflex disease normal ejection fraction at that time,  Prior echocardiogram showing normal LV systolic function, otherwise normal study prior EKGs and 2012 showed normal sinus rhythm with right bundle branch block, left anterior fascicular block   PMH:   has a past medical history of Anxiety state, unspecified; Arthritis; Diffuse large B cell lymphoma ([place]); H/O echocardiogram; Hyperlipidemia; Hypertensive heart disease; Lymphoma ([place]); Mobitz II; Non-obstructive CAD; Tremor; Urticaria, unspecified; and Ventricular tachycardia ([name]).  PSH:    Past Surgical History:  Procedure Laterality Date  . APPENDECTOMY  1948  . CARDIAC CATHETERIZATION  11/17/2010   [facility]-  LAD min irregs, LCX min irregs, RCA 40p, EF 60%.   Marland Kitchen CATARACT EXTRACTION, BILATERAL Bilateral ~ 2012  . INSERT / REPLACE / REMOVE PACEMAKER  12/05/2013  . PERMANENT PACEMAKER INSERTION N/A 12/05/2013   Procedure: PERMANENT PACEMAKER INSERTION;  Surgeon: Coralyn Mark, MD;  Location: [facility] CATH LAB;  Service: Cardiovascular;  Laterality: N/A;    Current Outpatient Prescriptions  Medication Sig Dispense Refill  . aspirin 81 MG tablet Take 81 mg by mouth daily.     . cetirizine (ZYRTEC) 10 MG tablet Take 10 mg by mouth daily.    . Cholecalciferol (VITAMIN D PO) Take 2 tablets by mouth daily.    . enalapril (VASOTEC) 10 MG tablet Take 1 tablet by mouth  daily 90 tablet 3  . ferrous sulfate 325 (65 FE) MG tablet Take 325 mg by mouth daily with breakfast.    . fluticasone (FLONASE) 50 MCG/ACT nasal spray Place 2 sprays into both nostrils daily as needed for allergies or rhinitis.     . Multiple Vitamins-Minerals (MULTIVITAMIN WITH MINERALS) tablet Take 1 tablet by mouth daily.     . Omega-3 Fatty Acids (FISH OIL) 1000 MG CAPS Take 1,200 mg by mouth 2 (two) times daily.     . Skin Protectants, Misc. (EUCERIN) cream Apply 1 application topically daily as needed for dry skin.     Marland Kitchen vitamin C (ASCORBIC ACID) 500 MG tablet Take 500 mg by mouth daily.     No current facility-administered medications for this visit.      Allergies:   Keflex [cephalexin]; Penicillins; and Tape   Social History:  The  patient  reports that he has never smoked. He has never used smokeless tobacco. He reports that he drinks about 1.2 oz of alcohol per week . He reports that he does not use drugs.   Family History:   family history includes Coronary artery disease in his mother; Emphysema in his father.    Review of Systems: Review of Systems  Constitutional: Negative.   Respiratory: Negative.   Cardiovascular: Negative.   Gastrointestinal: Negative.   Musculoskeletal: Negative.   Neurological: Negative.    Psychiatric/Behavioral: Negative.   All other systems reviewed and are negative.    PHYSICAL EXAM: VS:  BP (!) 160/84   Pulse 75   Ht 5\' 8"  (1.727 m)   Wt 178 lb 1.9 oz (80.8 kg)   SpO2 98%   BMI 27.08 kg/m  , BMI Body mass index is 27.08 kg/m. GEN: Well nourished, well developed, in no acute distress  HEENT: normal  Neck: no JVD, carotid bruits, or masses Cardiac: RRR; no murmurs, rubs, or gallops,no edema  Respiratory:  clear to auscultation bilaterally, normal work of breathing GI: soft, nontender, nondistended, + BS MS: no deformity or atrophy  Skin: warm and dry, no rash Neuro:  Strength and sensation are intact Psych: euthymic mood, full affect    Recent Labs: 08/07/2015: ALT 37; BUN 28; Creatinine, Ser 1.32; Hemoglobin 13.2; Platelets 113; Potassium 4.4; Sodium 139    Lipid Panel No results found for: CHOL, HDL, LDLCALC, TRIG    Wt Readings from Last 3 Encounters:  02/18/16 178 lb 1.9 oz (80.8 kg)  01/30/16 178 lb (80.7 kg)  12/17/15 176 lb 12 oz (80.2 kg)       ASSESSMENT AND PLAN:  Essential hypertension, benign - Plan: EKG 12-Lead Blood pressure elevated on today's visit, recommended he monitor blood pressure at home Call us if he continues to run high, we would increase his ACE inhibitor  Complete heart block ([place]) - Plan: EKG 12-Lead Pacer in place, followed by Dr. Caryl Comes  VT (ventricular tachycardia) ([place]) No recent episodes of VT, previously treated with amiodarone  Arteriosclerosis of coronary artery Very mild coronary disease 2 years ago by catheterization  Tachycardia  Total encounter time more than 15 minutes  Greater than 50% was spent in counseling and coordination of care with the patient   Disposition:   F/U  12 months   Orders Placed This Encounter  Procedures  . EKG 12-Lead     Signed, Esmond Plants, M.D., Ph.D. 02/18/2016  [place], [place]

## 2016-03-17 ENCOUNTER — Ambulatory Visit (INDEPENDENT_AMBULATORY_CARE_PROVIDER_SITE_OTHER): Payer: Medicare Other | Admitting: *Deleted

## 2016-03-17 DIAGNOSIS — M9903 Segmental and somatic dysfunction of lumbar region: Secondary | ICD-10-CM | POA: Diagnosis not present

## 2016-03-17 DIAGNOSIS — I442 Atrioventricular block, complete: Secondary | ICD-10-CM | POA: Diagnosis not present

## 2016-03-17 DIAGNOSIS — M9904 Segmental and somatic dysfunction of sacral region: Secondary | ICD-10-CM | POA: Diagnosis not present

## 2016-03-17 DIAGNOSIS — M545 Low back pain: Secondary | ICD-10-CM | POA: Diagnosis not present

## 2016-03-17 DIAGNOSIS — M461 Sacroiliitis, not elsewhere classified: Secondary | ICD-10-CM | POA: Diagnosis not present

## 2016-03-18 NOTE — Progress Notes (Signed)
Remote pacemaker transmission.   

## 2016-03-19 ENCOUNTER — Encounter: Payer: Self-pay | Admitting: Cardiology

## 2016-03-19 DIAGNOSIS — M9904 Segmental and somatic dysfunction of sacral region: Secondary | ICD-10-CM | POA: Diagnosis not present

## 2016-03-19 DIAGNOSIS — M545 Low back pain: Secondary | ICD-10-CM | POA: Diagnosis not present

## 2016-03-19 DIAGNOSIS — M461 Sacroiliitis, not elsewhere classified: Secondary | ICD-10-CM | POA: Diagnosis not present

## 2016-03-19 DIAGNOSIS — M9903 Segmental and somatic dysfunction of lumbar region: Secondary | ICD-10-CM | POA: Diagnosis not present

## 2016-03-26 DIAGNOSIS — M5413 Radiculopathy, cervicothoracic region: Secondary | ICD-10-CM | POA: Diagnosis not present

## 2016-03-26 DIAGNOSIS — M9901 Segmental and somatic dysfunction of cervical region: Secondary | ICD-10-CM | POA: Diagnosis not present

## 2016-03-27 DIAGNOSIS — M9901 Segmental and somatic dysfunction of cervical region: Secondary | ICD-10-CM | POA: Diagnosis not present

## 2016-03-27 DIAGNOSIS — M5413 Radiculopathy, cervicothoracic region: Secondary | ICD-10-CM | POA: Diagnosis not present

## 2016-04-23 LAB — CUP PACEART REMOTE DEVICE CHECK
Battery Remaining Longevity: 124 mo
Brady Statistic AP VP Percent: 2.8 %
Brady Statistic AP VS Percent: 18 %
Brady Statistic AS VP Percent: 68 %
Brady Statistic RA Percent Paced: 20 %
Implantable Lead Implant Date: 20150818
Implantable Lead Location: 753859
Implantable Lead Location: 753860
Implantable Lead Model: 1948
Lead Channel Impedance Value: 410 Ohm
Lead Channel Impedance Value: 800 Ohm
Lead Channel Pacing Threshold Amplitude: 0.5 V
Lead Channel Pacing Threshold Pulse Width: 0.5 ms
Lead Channel Sensing Intrinsic Amplitude: 12 mV
Lead Channel Setting Pacing Amplitude: 2 V
Lead Channel Setting Pacing Pulse Width: 0.5 ms
MDC IDC LEAD IMPLANT DT: 20150818
MDC IDC MSMT BATTERY REMAINING PERCENTAGE: 95.5 %
MDC IDC MSMT BATTERY VOLTAGE: 3.02 V
MDC IDC MSMT LEADCHNL RA PACING THRESHOLD AMPLITUDE: 0.5 V
MDC IDC MSMT LEADCHNL RA SENSING INTR AMPL: 4.5 mV
MDC IDC MSMT LEADCHNL RV PACING THRESHOLD PULSEWIDTH: 0.5 ms
MDC IDC PG IMPLANT DT: 20150818
MDC IDC SESS DTM: 20171128075800
MDC IDC SET LEADCHNL RV PACING AMPLITUDE: 0.75 V
MDC IDC SET LEADCHNL RV SENSING SENSITIVITY: 4 mV
MDC IDC STAT BRADY AS VS PERCENT: 11 %
MDC IDC STAT BRADY RV PERCENT PACED: 71 %
Pulse Gen Serial Number: 7664739

## 2016-04-30 ENCOUNTER — Encounter: Payer: Self-pay | Admitting: Family Medicine

## 2016-04-30 ENCOUNTER — Ambulatory Visit (INDEPENDENT_AMBULATORY_CARE_PROVIDER_SITE_OTHER): Payer: Medicare Other | Admitting: Family Medicine

## 2016-04-30 VITALS — BP 140/55 | HR 60 | Temp 97.9°F | Resp 16 | Ht 68.0 in | Wt 177.6 lb

## 2016-04-30 DIAGNOSIS — J019 Acute sinusitis, unspecified: Secondary | ICD-10-CM | POA: Diagnosis not present

## 2016-04-30 DIAGNOSIS — J302 Other seasonal allergic rhinitis: Secondary | ICD-10-CM | POA: Diagnosis not present

## 2016-04-30 DIAGNOSIS — J309 Allergic rhinitis, unspecified: Secondary | ICD-10-CM | POA: Insufficient documentation

## 2016-04-30 MED ORDER — FLUTICASONE PROPIONATE 50 MCG/ACT NA SUSP: 2.0000 | Freq: Every day | NASAL | 3 refills | Status: DC | PRN

## 2016-04-30 MED ORDER — IPRATROPIUM BROMIDE 0.06 % NA SOLN: 2.0000 | Freq: Four times a day (QID) | NASAL | 0 refills | Status: DC

## 2016-04-30 MED ORDER — AZITHROMYCIN 250 MG PO TABS: ORAL_TABLET | ORAL | 0 refills | Status: DC

## 2016-04-30 NOTE — Progress Notes (Signed)
Subjective:    Patient ID: Clinton Joseph, male    DOB: Dec 23, 1927, 83 y.o.   MRN: [id number]  Clinton Joseph is a 83 y.o. male presenting on 04/30/2016 for Nasal Congestion (yellowish mucus onset week coughing is worst and throat is sore from coughing no fever or HA or ear pain)  Patient presents for a same day appointment.  HPI  SINUSITIS Reports symptoms started about 1 week ago nasal / sinus congestion and coughing, worse at night with productive cough, thick yellow mucus. Now seems to have some intermittent brief worsening spells, but not seem to be improving, persistent sinus pressure and congestion. - Sick contact with wife similar URI - Received influenza vaccine this season - Reports PCN allergy with hives/rash. He has taken Azithromycin in past 2016 and 2017 tolerated well without allergy or problem. - Admits mild temperature instability - Denies chest pain, dyspnea, wheezing, nausea, vomiting, muscle ache, abdominal pain  PMH - Complete heart block with permanent pacemaker followed by Cardiology   Social History  Substance Use Topics  . Smoking status: Never Smoker  . Smokeless tobacco: Never Used  . Alcohol use 1.2 oz/week    2 Glasses of wine per week    Review of Systems Per HPI unless specifically indicated above     Objective:    BP (!) 140/55   Pulse 60   Temp 97.9 F (36.6 C) (Oral)   Resp 16   Ht 5\' 8"  (1.727 m)   Wt 177 lb 9.6 oz (80.6 kg)   SpO2 99%   BMI 27.00 kg/m   Wt Readings from Last 3 Encounters:  04/30/16 177 lb 9.6 oz (80.6 kg)  02/18/16 178 lb 1.9 oz (80.8 kg)  01/30/16 178 lb (80.7 kg)    Physical Exam  Constitutional: He appears well-developed and well-nourished. No distress.  Pleasant elderly 83 yr male, comfortable, cooperative  HENT:  Head: Normocephalic and atraumatic.  Mild bilateral maxillary sinus tenderness, with frontal sinuses non-tender. Nares patent with mild congestion and Right anterior nasal mucosa  with some dried blood with hemostasis from prior epistaxis, without purulence or edema. Bilateral TMs clear without erythema, effusion or bulging. Oropharynx clear without erythema, exudates, edema or asymmetry.  Eyes: Conjunctivae are normal. Right eye exhibits no discharge. Left eye exhibits no discharge.  Neck: Normal range of motion. Neck supple.  Cardiovascular: Normal rate, regular rhythm, normal heart sounds and intact distal pulses.   No murmur heard. Pulmonary/Chest: Effort normal and breath sounds normal. No respiratory distress. He has no wheezes. He has no rales.  Good air movement. Speaks full sentences.  Musculoskeletal: He exhibits no edema.  Lymphadenopathy:    He has no cervical adenopathy.  Neurological: He is alert.  Skin: Skin is warm and dry. No rash noted. He is not diaphoretic. No erythema.  Psychiatric: His behavior is normal.  Nursing note and vitals reviewed.      Assessment & Plan:   Problem List Items Addressed This Visit    Allergic rhinitis due to allergen    With mild sinusitis now, in setting of chronic stable allergic rhinitis. With mild R nasal not active epistaxis anteriorly, likely dry nasal mucosa and current sinusitis. - Continues daily Zyrtec and Flonase, request refill Flonase - Recommend start regular nasal saline, and/or vaseline topical for nasal mucosa protection if persistent bleeding, can try humidifier as well      Relevant Medications   fluticasone (FLONASE) 50 MCG/ACT nasal spray    Other Visit  Diagnoses    Acute rhinosinusitis    -  Primary  Consistent with acute rhinosinusitis, likely initially viral URI vs allergic rhinitis component without improvement >1 week, some rebound congestion and temperature instability without confirmed fever.  Plan: 1. Reassurance, likely self-limited - discussion with patient, mutual decision to proceed with antibiotic rx Azithromycin Z pak dosing. He has tolerated this antibiotic in past 1-2 years,  known PCN allergy, discussed risks and side effects with concern for cardiac arrhythmia in setting of his complete heart block s/p pacemaker, he would like to try this med, alternatively considered Doxycycline as option if worsening 2. Hold Flonase for 1 week. Start Atrovent nasal spray decongestant 3. Continue Zyrtec 10mg  chronically 4. Refilled Flonase for use after stop Atrovent 5. Can try OTC Mucinex-DM 1 week 6. Supportive care with nasal saline OTC, hydration 7. Return criteria reviewed    Relevant Medications   azithromycin (ZITHROMAX Z-PAK) 250 MG tablet   ipratropium (ATROVENT) 0.06 % nasal spray   fluticasone (FLONASE) 50 MCG/ACT nasal spray      Meds ordered this encounter  Medications  . azithromycin (ZITHROMAX Z-PAK) 250 MG tablet    Sig: Take 2 tabs (500mg  total) on Day 1. Take 1 tab (250mg ) daily for next 4 days.    Dispense:  6 tablet    Refill:  0  . ipratropium (ATROVENT) 0.06 % nasal spray    Sig: Place 2 sprays into both nostrils 4 (four) times daily. For up to 5-7 days then stop    Dispense:  15 mL    Refill:  0  . fluticasone (FLONASE) 50 MCG/ACT nasal spray    Sig: Place 2 sprays into both nostrils daily as needed for allergies or rhinitis.    Dispense:  16 g    Refill:  3      Follow up plan: Return in about 2 weeks (around 05/14/2016), or if symptoms worsen or fail to improve, for sinusitis.  Nobie Putnam, [facility] 04/30/2016, 11:45 AM

## 2016-04-30 NOTE — Patient Instructions (Signed)
Thank you for coming in to clinic today.  1. It sounds like you have a Sinusitis - this most likely started as an Upper Respiratory Virus that has settled into an infection. Allergies can also cause this. - Start Azithromycin antibiotic as prescribed for 5 days - STOP using Flonase for now. Use Atrovent nasal spray decongestant 2 sprays in each nostril for up to 4 times a day for next 5-7 days - May Try Mucinex-DM over the counter for up 1 week - Continue Cetirizine Zyrtec 10mg  daily - After finish Atrovent, then resume Flonase 2 sprays in each nostril daily for next 4-6 weeks, then you may stop and use seasonally or as needed - Recommend to start using(Simply Salie or Ocean Spray) Nasal Saline spray multiple times a day to help flush out congestion and clear sinuses. Concern with dry nose and some bleeding. You can also use Vaseline on a q-tip inside the tip of your nose to keep it protected - Improve hydration by drinking plenty of clear fluids (water, gatorade) to reduce secretions and thin congestion - Congestion draining down throat can cause irritation. May try warm herbal tea with honey, cough drops - Can take Tylenol or Ibuprofen as needed for fevers  If worsening with persistent fevers, sinus pain or pressure, difficulty breathing shortness of breath, follow-up sooner or seek more immediate treatment at hospital or urgent care  Please schedule a follow-up appointment with Dr. Parks Ranger or Dr [name] as needed in 1-2 weeks for sinusitis  If you have any other questions or concerns, please feel free to call the clinic or send a message through [place]. You may also schedule an earlier appointment if necessary.  Nobie Putnam, DO [place]

## 2016-04-30 NOTE — Assessment & Plan Note (Addendum)
With mild sinusitis now, in setting of chronic stable allergic rhinitis. With mild R nasal not active epistaxis anteriorly, likely dry nasal mucosa and current sinusitis. - Continues daily Zyrtec and Flonase, request refill Flonase - Recommend start regular nasal saline, and/or vaseline topical for nasal mucosa protection if persistent bleeding, can try humidifier as well

## 2016-05-09 ENCOUNTER — Other Ambulatory Visit: Payer: Self-pay | Admitting: Family Medicine

## 2016-05-27 ENCOUNTER — Other Ambulatory Visit: Payer: Self-pay | Admitting: Family Medicine

## 2016-05-27 DIAGNOSIS — J019 Acute sinusitis, unspecified: Secondary | ICD-10-CM

## 2016-05-27 MED ORDER — IPRATROPIUM BROMIDE 0.06 % NA SOLN: 2.0000 | Freq: Four times a day (QID) | NASAL | 0 refills | Status: DC

## 2016-05-29 DIAGNOSIS — Z961 Presence of intraocular lens: Secondary | ICD-10-CM | POA: Diagnosis not present

## 2016-05-29 DIAGNOSIS — H01009 Unspecified blepharitis unspecified eye, unspecified eyelid: Secondary | ICD-10-CM | POA: Diagnosis not present

## 2016-06-03 ENCOUNTER — Telehealth: Payer: Self-pay | Admitting: Cardiovascular Disease

## 2016-06-03 ENCOUNTER — Ambulatory Visit (INDEPENDENT_AMBULATORY_CARE_PROVIDER_SITE_OTHER): Payer: Medicare Other | Admitting: Family Medicine

## 2016-06-03 ENCOUNTER — Encounter: Payer: Self-pay | Admitting: Family Medicine

## 2016-06-03 VITALS — BP 160/75 | HR 59 | Temp 97.9°F | Resp 16 | Ht 68.0 in | Wt 176.0 lb

## 2016-06-03 DIAGNOSIS — I452 Bifascicular block: Secondary | ICD-10-CM

## 2016-06-03 DIAGNOSIS — I10 Essential (primary) hypertension: Secondary | ICD-10-CM | POA: Diagnosis not present

## 2016-06-03 DIAGNOSIS — I2510 Atherosclerotic heart disease of native coronary artery without angina pectoris: Secondary | ICD-10-CM | POA: Diagnosis not present

## 2016-06-03 DIAGNOSIS — R7303 Prediabetes: Secondary | ICD-10-CM | POA: Diagnosis not present

## 2016-06-03 DIAGNOSIS — Z950 Presence of cardiac pacemaker: Secondary | ICD-10-CM | POA: Diagnosis not present

## 2016-06-03 MED ORDER — LOSARTAN POTASSIUM 50 MG PO TABS: 50.0000 mg | ORAL_TABLET | Freq: Every day | ORAL | 3 refills | Status: DC

## 2016-06-03 NOTE — Telephone Encounter (Signed)
Pt wife called states pt went to PCP today, and pt BP was 160/75. States PCP increased Cozaar to 50mg  . She wanted to make Dr. Rockey Situ aware. Please call and verify.

## 2016-06-03 NOTE — Progress Notes (Signed)
Name: Clinton Joseph   MRN: [id number]    DOB: Feb 16, 1928   Date:06/03/2016       Progress Note  Subjective  Chief Complaint  Chief Complaint  Patient presents with   Hypertension   Hyperglycemia    HPI Here for f/u of HBP.  He takes his meds.  He has no c/o.    No problem-specific Assessment & Plan notes found for this encounter.   Past Medical History:  Diagnosis Date   Anxiety state, unspecified    Arthritis    a. Hands.   Diffuse large B cell lymphoma ([place])    a. 2007 s/p chemo - followed by Dr. Jeb Levering.   H/O echocardiogram    a. 11/2013 Echo: EF 55-60%, mildly dil LA, mild to mod Ao sclerosis.   Hyperlipidemia    Hypertensive heart disease    Lymphoma ([place])    Mobitz II    a. 11/2013 syncope-->s/p SJM Assurity DR model [id number] (ser # [id number]).   Non-obstructive CAD    a. 10/2010 Cath: >M nl, LAD/LCX min irregs, RCA 40p, EF 60%.   Tremor    a. right hand   Urticaria, unspecified    Ventricular tachycardia (HCC)    a. 2012->broke with amio, neg w/u.    Past Surgical History:  Procedure Laterality Date   APPENDECTOMY  1948   CARDIAC CATHETERIZATION  11/17/2010   [facility]- LAD min irregs, LCX min irregs, RCA 40p, EF 60%.    CATARACT EXTRACTION, BILATERAL Bilateral ~ 2012   INSERT / REPLACE / REMOVE PACEMAKER  12/05/2013   PERMANENT PACEMAKER INSERTION N/A 12/05/2013   Procedure: PERMANENT PACEMAKER INSERTION;  Surgeon: Coralyn Mark, MD;  Location: [facility];  Service: Cardiovascular;  Laterality: N/A;    Family History  Problem Relation Age of Onset   Coronary artery disease Mother    Emphysema Father     Social History   Social History   Marital status: Married    Spouse name: N/A   Number of children: N/A   Years of education: N/A   Occupational History   accountant     retired   Social History Main Topics   Smoking status: Never Smoker   Smokeless tobacco: Never Used   Alcohol use 1.2 oz/week    2 Glasses of wine  per week   Drug use: No   Sexual activity: Yes   Other Topics Concern   Not on file   Social History Narrative   No narrative on file     Current Outpatient Prescriptions:    aspirin 81 MG tablet, Take 81 mg by mouth daily. , Disp: , Rfl:    cetirizine (ZYRTEC) 10 MG tablet, Take 10 mg by mouth daily., Disp: , Rfl:    Cholecalciferol (VITAMIN D PO), Take 2 tablets by mouth daily., Disp: , Rfl:    ferrous sulfate 325 (65 FE) MG tablet, Take 325 mg by mouth daily with breakfast., Disp: , Rfl:    fluticasone (FLONASE) 50 MCG/ACT nasal spray, Place 2 sprays into both nostrils daily as needed for allergies or rhinitis., Disp: 16 g, Rfl: 3   ipratropium (ATROVENT) 0.06 % nasal spray, Place 2 sprays into both nostrils 4 (four) times daily. For up to 5-7 days then stop, Disp: 45 mL, Rfl: 0   Multiple Vitamins-Minerals (MULTIVITAMIN WITH MINERALS) tablet, Take 1 tablet by mouth daily. , Disp: , Rfl:    Omega-3 Fatty Acids (FISH OIL) 1000 MG CAPS, Take 1,200 mg by mouth  2 (two) times daily. , Disp: , Rfl:    Skin Protectants, Misc. (EUCERIN) cream, Apply 1 application topically daily as needed for dry skin. , Disp: , Rfl:    vitamin C (ASCORBIC ACID) 500 MG tablet, Take 500 mg by mouth daily., Disp: , Rfl:    losartan (COZAAR) 50 MG tablet, Take 1 tablet (50 mg total) by mouth daily., Disp: 90 tablet, Rfl: 3  Allergies  Allergen Reactions   Keflex [Cephalexin]     Diarrhea    Penicillins Hives and Rash   Tape Rash     Review of Systems  Constitutional: Negative for chills, fever, malaise/fatigue and weight loss.  HENT: Negative for hearing loss and tinnitus.   Eyes: Negative for blurred vision and double vision.  Respiratory: Negative for cough, shortness of breath and wheezing.   Cardiovascular: Negative for chest pain, palpitations and leg swelling.  Gastrointestinal: Negative for abdominal pain, blood in stool and heartburn.  Genitourinary: Negative for dysuria,  frequency and urgency.  Musculoskeletal: Negative for joint pain and myalgias.  Skin: Negative for rash.  Neurological: Negative for dizziness, tingling, tremors, weakness and headaches.      Objective  Vitals:   06/03/16 1040 06/03/16 1115  BP: (!) 161/65 (!) 160/75  Pulse: (!) 59   Resp: 16   Temp: 97.9 F (36.6 C)   TempSrc: Oral   Weight: 176 lb (79.8 kg)   Height: 5\' 8"  (1.727 m)     Physical Exam  Constitutional: He is oriented to person, place, and time and well-developed, well-nourished, and in no distress. No distress.  HENT:  Head: Normocephalic and atraumatic.  Eyes: Conjunctivae and EOM are normal. Pupils are equal, round, and reactive to light. No scleral icterus.  Neck: Normal range of motion. Neck supple. Carotid bruit is not present. No thyromegaly present.  Cardiovascular: Normal rate, regular rhythm and normal heart sounds.  Exam reveals no gallop and no friction rub.   No murmur heard. Pulmonary/Chest: Effort normal and breath sounds normal. No respiratory distress. He has no wheezes. He has no rales.  Musculoskeletal: He exhibits no edema.  Lymphadenopathy:    He has no cervical adenopathy.  Neurological: He is alert and oriented to person, place, and time.  Vitals reviewed.      Recent Results (from the past 2160 hour(s))  CUP PACEART REMOTE DEVICE CHECK     Status: None   Collection Time: 03/17/16  7:58 AM  Result Value Ref Range   Date Time Interrogation Session [id number]    Pulse Generator Manufacturer [employer]    Pulse Gen Model 2240 Assurity DR    Pulse Gen Serial Number [id number]    Clinic Name [facility]    Implantable Pulse Generator Type Implantable Pulse Generator    Implantable Pulse Generator Implant Date CI:1012718    Implantable Lead Manufacturer [place]    Implantable Lead Serial Number [id number]    Implantable Lead Implant Date CI:1012718    Implantable Lead Location Detail 1 UNKNOWN     Implantable Lead Location [id number]    Implantable Lead Manufacturer [facility]    Implantable Lead Model [id number] Tendril STS    Implantable Lead Serial Number [id number]    Implantable Lead Implant Date CI:1012718    Implantable Lead Location Detail 1 UNKNOWN    Implantable Lead Location [id number]    Lead Channel Setting Sensing Sensitivity 4.0 mV   Lead Channel Setting Sensing Adaptation Mode Fixed Pacing    Lead  Channel Setting Pacing Amplitude 2.0 V   Lead Channel Setting Pacing Pulse Width 0.5 ms   Lead Channel Setting Pacing Amplitude 0.75 V   Lead Channel Status     Lead Channel Impedance Value 410 ohm   Lead Channel Sensing Intrinsic Amplitude 4.5 mV   Lead Channel Pacing Threshold Amplitude 0.5 V   Lead Channel Pacing Threshold Pulse Width 0.5 ms   Lead Channel Status     Lead Channel Impedance Value 800 ohm   Lead Channel Sensing Intrinsic Amplitude 12.0 mV   Lead Channel Pacing Threshold Amplitude 0.5 V   Lead Channel Pacing Threshold Pulse Width 0.5 ms   Battery Status MOS    Battery Remaining Longevity 124 mo   Battery Remaining Percentage 95.5 %   Battery Voltage 3.02 V   Brady Statistic RA Percent Paced 20.0 %   Brady Statistic RV Percent Paced 71.0 %   Brady Statistic AP VP Percent 2.8 %   Brady Statistic AS VP Percent 68.0 %   Brady Statistic AP VS Percent 18.0 %   Brady Statistic AS VS Percent 11.0 %   Eval Rhythm Ap/As Vp      Assessment & Plan  Problem List Items Addressed This Visit      Cardiovascular and Mediastinum   Essential hypertension - Primary   Relevant Medications   losartan (COZAAR) 50 MG tablet   Other Relevant Orders   COMPLETE METABOLIC PANEL WITH GFR   CBC with Differential   Arteriosclerosis of coronary artery   Relevant Medications   losartan (COZAAR) 50 MG tablet   Other Relevant Orders   Lipid Profile   Bundle branch block, right and left anterior fascicular   Relevant Medications   losartan (COZAAR) 50 MG tablet     Other    Pacemaker   Pre-diabetes   Relevant Orders   HgB A1c      Meds ordered this encounter  Medications   losartan (COZAAR) 50 MG tablet    Sig: Take 1 tablet (50 mg total) by mouth daily.    Dispense:  90 tablet    Refill:  3   1. Essential hypertension  - losartan (COZAAR) 50 MG tablet; Take 1 tablet (50 mg total) by mouth daily.  Dispense: 90 tablet; Refill: 3 CM\P CBC 2. Arteriosclerosis of coronary artery Cont to see C ard (Dr. Rockey Situ). LIPID panel 3. Bundle branch block, right and left anterior fascicular   4. Pre-diabetes A1c  5. Pacemaker

## 2016-06-03 NOTE — Telephone Encounter (Signed)
Spoke w/ pt.  She is concerned that pt's losartan was increase so much, from 10 mg to 50 mg and that his BP will drop too much. Advised her that if pt is concerned, to cut the 50 mg in 1/2 and monitor his BP before giving additional 1/2 if BP will support it. She feels better about this and will back w/ any other questions or concerns.

## 2016-06-04 ENCOUNTER — Ambulatory Visit: Payer: Medicare Other | Admitting: Family Medicine

## 2016-06-12 ENCOUNTER — Other Ambulatory Visit: Payer: Medicare Other

## 2016-06-12 DIAGNOSIS — R7303 Prediabetes: Secondary | ICD-10-CM | POA: Diagnosis not present

## 2016-06-12 DIAGNOSIS — I10 Essential (primary) hypertension: Secondary | ICD-10-CM | POA: Diagnosis not present

## 2016-06-12 DIAGNOSIS — I2510 Atherosclerotic heart disease of native coronary artery without angina pectoris: Secondary | ICD-10-CM | POA: Diagnosis not present

## 2016-06-12 LAB — CBC WITH DIFFERENTIAL/PLATELET
BASOS ABS: 0 {cells}/uL (ref 0–200)
BASOS PCT: 0 %
EOS ABS: 300 {cells}/uL (ref 15–500)
Eosinophils Relative: 6 %
HEMATOCRIT: 40.4 % (ref 38.5–50.0)
HEMOGLOBIN: 13.5 g/dL (ref 13.2–17.1)
LYMPHS ABS: 1800 {cells}/uL (ref 850–3900)
Lymphocytes Relative: 36 %
MCH: 31 pg (ref 27.0–33.0)
MCHC: 33.4 g/dL (ref 32.0–36.0)
MCV: 92.9 fL (ref 80.0–100.0)
MONO ABS: 350 {cells}/uL (ref 200–950)
MPV: 9.8 fL (ref 7.5–12.5)
Monocytes Relative: 7 %
NEUTROS ABS: 2550 {cells}/uL (ref 1500–7800)
Neutrophils Relative %: 51 %
PLATELETS: 106 10*3/uL — AB (ref 140–400)
RBC: 4.35 MIL/uL (ref 4.20–5.80)
RDW: 14.3 % (ref 11.0–15.0)
WBC: 5 10*3/uL (ref 3.8–10.8)

## 2016-06-13 LAB — COMPLETE METABOLIC PANEL WITH GFR
ALT: 25 U/L (ref 9–46)
AST: 33 U/L (ref 10–35)
Albumin: 4.2 g/dL (ref 3.6–5.1)
Alkaline Phosphatase: 41 U/L (ref 40–115)
BILIRUBIN TOTAL: 0.8 mg/dL (ref 0.2–1.2)
BUN: 26 mg/dL — ABNORMAL HIGH (ref 7–25)
CHLORIDE: 103 mmol/L (ref 98–110)
CO2: 28 mmol/L (ref 20–31)
Calcium: 9.6 mg/dL (ref 8.6–10.3)
Creat: 1.28 mg/dL — ABNORMAL HIGH (ref 0.70–1.11)
GFR, EST AFRICAN AMERICAN: 58 mL/min — AB (ref >=60)
GFR, EST NON AFRICAN AMERICAN: 50 mL/min — AB (ref >=60)
GLUCOSE: 131 mg/dL — AB (ref 65–99)
POTASSIUM: 4.6 mmol/L (ref 3.5–5.3)
SODIUM: 139 mmol/L (ref 135–146)
TOTAL PROTEIN: 6.7 g/dL (ref 6.1–8.1)

## 2016-06-13 LAB — HEMOGLOBIN A1C
HEMOGLOBIN A1C: 5.8 % — AB (ref <5.7)
MEAN PLASMA GLUCOSE: 120 mg/dL

## 2016-06-13 LAB — LIPID PANEL
CHOL/HDL RATIO: 5.1 ratio — AB (ref <5.0)
Cholesterol: 198 mg/dL (ref <200)
HDL: 39 mg/dL — AB (ref >40)
LDL Cholesterol: 129 mg/dL — ABNORMAL HIGH (ref <100)
Triglycerides: 151 mg/dL — ABNORMAL HIGH (ref <150)
VLDL: 30 mg/dL (ref <30)

## 2016-06-16 ENCOUNTER — Ambulatory Visit (INDEPENDENT_AMBULATORY_CARE_PROVIDER_SITE_OTHER): Payer: Medicare Other | Admitting: *Deleted

## 2016-06-16 DIAGNOSIS — I442 Atrioventricular block, complete: Secondary | ICD-10-CM

## 2016-06-16 NOTE — Progress Notes (Signed)
Remote pacemaker transmission.   

## 2016-06-17 ENCOUNTER — Encounter: Payer: Self-pay | Admitting: Cardiology

## 2016-06-19 LAB — CUP PACEART REMOTE DEVICE CHECK
Battery Remaining Longevity: 125 mo
Battery Remaining Percentage: 95.5 %
Brady Statistic AP VP Percent: 1.9 %
Brady Statistic AS VP Percent: 61 %
Brady Statistic RA Percent Paced: 22 %
Brady Statistic RV Percent Paced: 62 %
Date Time Interrogation Session: 20180227084017
Implantable Lead Implant Date: 20150818
Implantable Lead Location: 753859
Implantable Lead Location: 753860
Implantable Lead Model: 1948
Lead Channel Impedance Value: 800 Ohm
Lead Channel Pacing Threshold Amplitude: 0.625 V
Lead Channel Pacing Threshold Pulse Width: 0.5 ms
Lead Channel Sensing Intrinsic Amplitude: 12 mV
Lead Channel Setting Pacing Amplitude: 2 V
Lead Channel Setting Pacing Pulse Width: 0.5 ms
Lead Channel Setting Sensing Sensitivity: 4 mV
MDC IDC LEAD IMPLANT DT: 20150818
MDC IDC MSMT BATTERY VOLTAGE: 3.02 V
MDC IDC MSMT LEADCHNL RA IMPEDANCE VALUE: 440 Ohm
MDC IDC MSMT LEADCHNL RA PACING THRESHOLD AMPLITUDE: 0.5 V
MDC IDC MSMT LEADCHNL RA SENSING INTR AMPL: 5 mV
MDC IDC MSMT LEADCHNL RV PACING THRESHOLD PULSEWIDTH: 0.5 ms
MDC IDC PG IMPLANT DT: 20150818
MDC IDC SET LEADCHNL RV PACING AMPLITUDE: 0.875
MDC IDC STAT BRADY AP VS PERCENT: 21 %
MDC IDC STAT BRADY AS VS PERCENT: 16 %
Pulse Gen Model: 2240
Pulse Gen Serial Number: 7664739

## 2016-06-22 ENCOUNTER — Other Ambulatory Visit: Payer: Self-pay | Admitting: Family Medicine

## 2016-06-22 DIAGNOSIS — J302 Other seasonal allergic rhinitis: Secondary | ICD-10-CM

## 2016-06-22 DIAGNOSIS — J019 Acute sinusitis, unspecified: Secondary | ICD-10-CM

## 2016-06-22 MED ORDER — FLUTICASONE PROPIONATE 50 MCG/ACT NA SUSP: 2.0000 | Freq: Every day | NASAL | 3 refills | Status: DC | PRN

## 2016-06-25 DIAGNOSIS — Z961 Presence of intraocular lens: Secondary | ICD-10-CM | POA: Diagnosis not present

## 2016-06-25 DIAGNOSIS — H04123 Dry eye syndrome of bilateral lacrimal glands: Secondary | ICD-10-CM | POA: Diagnosis not present

## 2016-06-25 DIAGNOSIS — H35033 Hypertensive retinopathy, bilateral: Secondary | ICD-10-CM | POA: Diagnosis not present

## 2016-06-25 DIAGNOSIS — H26492 Other secondary cataract, left eye: Secondary | ICD-10-CM | POA: Diagnosis not present

## 2016-07-21 ENCOUNTER — Other Ambulatory Visit: Payer: Self-pay | Admitting: Family Medicine

## 2016-07-21 DIAGNOSIS — J302 Other seasonal allergic rhinitis: Secondary | ICD-10-CM

## 2016-07-21 MED ORDER — FLUTICASONE PROPIONATE 50 MCG/ACT NA SUSP: 2.0000 | Freq: Every day | NASAL | 3 refills | Status: DC | PRN

## 2016-08-04 ENCOUNTER — Ambulatory Visit: Payer: Medicare Other | Admitting: Family Medicine

## 2016-08-18 ENCOUNTER — Ambulatory Visit (INDEPENDENT_AMBULATORY_CARE_PROVIDER_SITE_OTHER): Payer: Medicare Other | Admitting: Family Medicine

## 2016-08-18 ENCOUNTER — Encounter: Payer: Self-pay | Admitting: Family Medicine

## 2016-08-18 VITALS — BP 144/78 | HR 60 | Temp 97.5°F | Resp 16 | Ht 68.0 in | Wt 178.0 lb

## 2016-08-18 DIAGNOSIS — R7303 Prediabetes: Secondary | ICD-10-CM | POA: Diagnosis not present

## 2016-08-18 DIAGNOSIS — E782 Mixed hyperlipidemia: Secondary | ICD-10-CM

## 2016-08-18 DIAGNOSIS — J3089 Other allergic rhinitis: Secondary | ICD-10-CM

## 2016-08-18 DIAGNOSIS — N183 Chronic kidney disease, stage 3 unspecified: Secondary | ICD-10-CM | POA: Insufficient documentation

## 2016-08-18 DIAGNOSIS — I10 Essential (primary) hypertension: Secondary | ICD-10-CM | POA: Diagnosis not present

## 2016-08-18 DIAGNOSIS — E785 Hyperlipidemia, unspecified: Secondary | ICD-10-CM | POA: Insufficient documentation

## 2016-08-18 NOTE — Assessment & Plan Note (Signed)
Stable chronic problem CKD-III, likely secondary to chronic HTN, age, among other factors. Last Cr baseline 1.2 approx Not followed by Nephrology Improve hydration. Avoid NSAIDs. Continue ARB Follow-up chemistry Cr q 6-12 months

## 2016-08-18 NOTE — Assessment & Plan Note (Signed)
Stable, improved A1c last checked 5.8 in 05/2016 Not on medications. Controlled with lifestyle Complicated by CKD-III, CAD  Plan: 1. Encouraged improved exercise with stationary bike as planned, continue to reduce carbs in diet 2. Follow-up to monitor A1c q 6 months for now, next check POC in 3 months

## 2016-08-18 NOTE — Progress Notes (Signed)
Subjective:    Patient ID: Clinton Joseph, male    DOB: 11/25/1927, 83 y.o.   MRN: [id number]  Clinton Joseph is a 83 y.o. male presenting on 08/18/2016 for Hypertension   HPI   CHRONIC HTN: Reports last visit with prior PCP Dr [name] in 05/2016, states at that visit his Losartan was increased from 10mg  to 50mg  daily, and per Cardiology they requested that he reduce dose of Losartan 50mg  in half so he takes half pill for 25mg  daily since then and doing well. - Has BP cuff but not regularly checking BP at home. States he usually has higher BP in doctors office on electronic cuff but it usually improves Current Meds - Losartan 50mg  - takes HALF tab dose 25mg  daily   Reports good compliance, took meds today. Tolerating well, w/o complaints. Lifestyle: - Exercise: has a stationary bike got 2 weeks ago, rides it x 2 daily up to 3 miles per, previously going to [facility] - Diet: No changes, does not add salt, not sure on sodium food labels, wife does most of food prep Denies CP, dyspnea, HA, edema, dizziness / lightheadedness  CKD-III - Reports known history of mild elevated kidney function, review of last labs since 2016 has been stable around 1.2 - Last lab checked 05/2016 - He has no concerns at this time - Trying to stay well hydrated - Denies urinary symptoms  Pre-Diabetes / HLD, Mixed - Recent A1c trend improved A1c down to 5.8, not on medications, trying to improve low carb diet - Last lipids 05/2016 with some elevated LDL 129, and mild low HDL 39, and borderline elevated TG 151. - he does not recall ever taking a Statin medication, has not discussed this with prior PCP or Cardiology by hist report, he thinks his wife may be taking Simvastatin. He has no known allergies or intolerance to statin therapy - Takes OTC Fish Oil long time, 1 pill twice daily  History of non-obstructive CAD / Complete Heart Block (s/p pacer) history of VT - Followed by Cardiology (Dr Rockey Situ and Dr  Caryl Comes for pacer), last seen 01/2017 - He reports doing well overall without any significant changes, continues to get remote pacer checks - Takes ASA 81mg  daily - Denies any anginal symptoms, chest pain or pressure, dyspnea on exertion  Seasonal Allergic Rhinitis: - Has had some recent allergy problems, worse in Spring. Taking Cetirizine daily, also Flonase, doing well on these medicines. - Denies fever/chills, sinus pain or pressure, ear pain  PMH Lymphoma s/p chemotherapy  Social History  Substance Use Topics  . Smoking status: Never Smoker  . Smokeless tobacco: Never Used  . Alcohol use 1.2 oz/week    2 Glasses of wine per week    Review of Systems Per HPI unless specifically indicated above     Objective:    BP (!) 144/78 (BP Location: Left Arm, Cuff Size: Normal)   Pulse 60   Temp 97.5 F (36.4 C) (Oral)   Resp 16   Ht 5\' 8"  (1.727 m)   Wt 178 lb (80.7 kg)   BMI 27.06 kg/m   Wt Readings from Last 3 Encounters:  08/18/16 178 lb (80.7 kg)  06/03/16 176 lb (79.8 kg)  04/30/16 177 lb 9.6 oz (80.6 kg)    Physical Exam  Constitutional: He is oriented to person, place, and time. He appears well-developed and well-nourished. No distress.  elderly 83 year old male well appearing, comfortable, cooperative, pleasant  HENT:  Head: Normocephalic  and atraumatic.  Mouth/Throat: Oropharynx is clear and moist.  Hearing aid in place R ear  Frontal / maxillary sinuses non-tender. Nares patent without purulence or edema. Oropharynx moist mucus mem, clear without erythema, exudates, edema or asymmetry.  Eyes: Conjunctivae are normal. Right eye exhibits no discharge. Left eye exhibits no discharge.  Neck: Normal range of motion. Neck supple. No thyromegaly present.  No carotid bruits  Cardiovascular: Normal rate, regular rhythm, normal heart sounds and intact distal pulses.   No murmur heard. Pulmonary/Chest: Effort normal and breath sounds normal. No respiratory distress. He has  no wheezes. He has no rales.  Musculoskeletal: He exhibits no edema.  Lymphadenopathy:    He has no cervical adenopathy.  Neurological: He is alert and oriented to person, place, and time.  Skin: Skin is warm and dry. No rash noted. He is not diaphoretic. No erythema.  Psychiatric: He has a normal mood and affect. His behavior is normal.  Nursing note and vitals reviewed.      Assessment & Plan:   Problem List Items Addressed This Visit    Pre-diabetes    Stable, improved A1c last checked 5.8 in 05/2016 Not on medications. Controlled with lifestyle Complicated by CKD-III, CAD  Plan: 1. Encouraged improved exercise with stationary bike as planned, continue to reduce carbs in diet 2. Follow-up to monitor A1c q 6 months for now, next check POC in 3 months      Hyperlipidemia    Mild dyslipidemia last check 05/2016, LDL 129, HDL 39. In setting of non obstructive CAD Taking Fish Oil OTC 1g BID No prior statin therapy (chart review without documentation of statin, and patient cannot recall) He is at increased ASCVD risk, especially with known CAD Continues on ASA 81mg  daily Reviewed indication and benefits / risks of statin therapy today, written down names for him to review and consider, can discuss with Cardiology Dr Rockey Situ at next visit or review with me at next visit as well.      Essential hypertension - Primary    Mild to moderately elevated SBP initial, some improvement with repeat measurement still SBP >140, not checking BP at home regularly. Complicated by CKD-III, CAD  Plan: 1. Continue current medication today - Losartan 50mg  half tab daily for 25mg , reviewed prior history had been on BB and higher dose ACEi / ARB, has had complications with higher dose anti-HTN therapy, so will defer this decision at this time. Patient comfortable with current med 2. Start monitoring home BP more regularly and write down readings, BP log handout given, if persistently elevated >140/90, then  needs to contact office to follow-up sooner, may also call Cardiology 3. Encouraged to continue current improved exercise plan with stationary bike, also work on improving diet, check food labels sodium for low salt diet 4. Follow-up 3 months HTN      CKD (chronic kidney disease), stage III    Stable chronic problem CKD-III, likely secondary to chronic HTN, age, among other factors. Last Cr baseline 1.2 approx Not followed by Nephrology Improve hydration. Avoid NSAIDs. Continue ARB Follow-up chemistry Cr q 6-12 months      Allergic rhinitis due to allergen    Improved from before, has chronic stable allergic rhinitis Continue Zyrtec, Flonase Has prior rx Atrovent PRN No acute evidence of sinusitis Follow-up as needed           Follow up plan: Return in about 3 months (around 11/18/2016) for Pre-DM A1c, HTN, HLD.  Nobie Putnam, [facility] 08/18/2016, 4:39 PM

## 2016-08-18 NOTE — Assessment & Plan Note (Signed)
Mild to moderately elevated SBP initial, some improvement with repeat measurement still SBP >140, not checking BP at home regularly. Complicated by CKD-III, CAD  Plan: 1. Continue current medication today - Losartan 50mg  half tab daily for 25mg , reviewed prior history had been on BB and higher dose ACEi / ARB, has had complications with higher dose anti-HTN therapy, so will defer this decision at this time. Patient comfortable with current med 2. Start monitoring home BP more regularly and write down readings, BP log handout given, if persistently elevated >140/90, then needs to contact office to follow-up sooner, may also call Cardiology 3. Encouraged to continue current improved exercise plan with stationary bike, also work on improving diet, check food labels sodium for low salt diet 4. Follow-up 3 months HTN

## 2016-08-18 NOTE — Assessment & Plan Note (Addendum)
Mild dyslipidemia last check 05/2016, LDL 129, HDL 39. In setting of non obstructive CAD Taking Fish Oil OTC 1g BID No prior statin therapy (chart review without documentation of statin, and patient cannot recall) He is at increased ASCVD risk, especially with known CAD Continues on ASA 81mg  daily Reviewed indication and benefits / risks of statin therapy today, written down names for him to review and consider, can discuss with Cardiology Dr Rockey Situ at next visit or review with me at next visit as well.

## 2016-08-18 NOTE — Patient Instructions (Addendum)
Thank you for coming to the clinic today.  1. Blood pressure was elevated today, improved on manual cuff check, but still elevated. - Try to start checking BP at home 2-3 times weekly for now, if persistently >140/90 can check more often and notify office, otherwise if improved may check 1-2 times weekly - No change to medicines today  2. Discussed elevated cholesterol numbers and goal to reduce risk of Heart Attack and Stroke - Please consider starting some low dose Statin Cholesterol medication (example, Lipitor,Atorvastatin, Crestor, Simvastatin) in future. You may read about this or discuss it with Dr Rockey Situ at next visit  3. Pre-Diabetes - we are keeping track of your blood sugar. Important to eat low sugar low carb diet and keep exercising. Next check in 3 months.  Please schedule a follow-up appointment with Dr. Parks Ranger in 3 months for HTN, Pre-DM A1c, Cholesterol  If you have any other questions or concerns, please feel free to call the clinic or send a message through [place]. You may also schedule an earlier appointment if necessary.  Nobie Putnam, DO [place]

## 2016-08-18 NOTE — Assessment & Plan Note (Addendum)
Improved from before, has chronic stable allergic rhinitis Continue Zyrtec, Flonase Has prior rx Atrovent PRN No acute evidence of sinusitis Follow-up as needed

## 2016-09-16 ENCOUNTER — Ambulatory Visit (INDEPENDENT_AMBULATORY_CARE_PROVIDER_SITE_OTHER): Payer: Medicare Other | Admitting: *Deleted

## 2016-09-16 DIAGNOSIS — I452 Bifascicular block: Secondary | ICD-10-CM

## 2016-09-17 NOTE — Progress Notes (Signed)
Remote pacemaker transmission.   

## 2016-09-18 LAB — CUP PACEART REMOTE DEVICE CHECK
Battery Remaining Percentage: 95.5 %
Battery Voltage: 3.02 V
Brady Statistic AP VP Percent: 1.5 %
Brady Statistic AP VS Percent: 20 %
Brady Statistic AS VS Percent: 19 %
Brady Statistic RV Percent Paced: 60 %
Implantable Lead Implant Date: 20150818
Implantable Lead Location: 753860
Implantable Pulse Generator Implant Date: 20150818
Lead Channel Impedance Value: 790 Ohm
Lead Channel Pacing Threshold Amplitude: 0.5 V
Lead Channel Pacing Threshold Amplitude: 0.625 V
Lead Channel Pacing Threshold Pulse Width: 0.5 ms
Lead Channel Sensing Intrinsic Amplitude: 5 mV
Lead Channel Setting Pacing Amplitude: 0.875
Lead Channel Setting Pacing Pulse Width: 0.5 ms
MDC IDC LEAD IMPLANT DT: 20150818
MDC IDC LEAD LOCATION: 753859
MDC IDC MSMT BATTERY REMAINING LONGEVITY: 122 mo
MDC IDC MSMT LEADCHNL RA IMPEDANCE VALUE: 410 Ohm
MDC IDC MSMT LEADCHNL RA PACING THRESHOLD PULSEWIDTH: 0.5 ms
MDC IDC MSMT LEADCHNL RV SENSING INTR AMPL: 12 mV
MDC IDC PG SERIAL: 7664739
MDC IDC SESS DTM: 20180530060015
MDC IDC SET LEADCHNL RA PACING AMPLITUDE: 2 V
MDC IDC SET LEADCHNL RV SENSING SENSITIVITY: 4 mV
MDC IDC STAT BRADY AS VP PERCENT: 59 %
MDC IDC STAT BRADY RA PERCENT PACED: 21 %

## 2016-09-25 ENCOUNTER — Encounter: Payer: Self-pay | Admitting: Cardiology

## 2016-10-16 ENCOUNTER — Other Ambulatory Visit: Payer: Self-pay

## 2016-10-16 NOTE — Patient Outreach (Signed)
[facility]) Care Management  10/16/2016  Campbell Kray [birth year] [id number]   Medication Adherence call to Mr. Trevonn Hallum  Mr, Flori is showing under [facility] he is past due on his losartan 50 mg patient said he is only taking 1/2 tablet instead of the 1 tablet  A day he is now going to a new doctor which  recommended for him to take 1/2 tablet daily.     [place] Management Direct Dial [phone]  Fax [phone] [email or web address]

## 2016-11-09 ENCOUNTER — Encounter: Payer: Self-pay | Admitting: Family Medicine

## 2016-11-09 ENCOUNTER — Ambulatory Visit (INDEPENDENT_AMBULATORY_CARE_PROVIDER_SITE_OTHER): Payer: Medicare Other | Admitting: Family Medicine

## 2016-11-09 VITALS — BP 122/63 | HR 110 | Temp 97.5°F | Resp 16 | Ht 68.0 in | Wt 177.8 lb

## 2016-11-09 DIAGNOSIS — M25562 Pain in left knee: Secondary | ICD-10-CM

## 2016-11-09 NOTE — Progress Notes (Signed)
Subjective:    Patient ID: Clinton Joseph, male    DOB: [birth year], 83 y.o.   MRN: [id number]  Beatrice Sehgal is a 83 y.o. male presenting on 11/09/2016 for Knee Pain (left side onset 3 days painful ROM)  Patient presents for a same day appointment.  HPI   LEFT KNEE PAIN, Acute - Reports symptoms started about 3 days ago without known inciting injury or fall, but was increasing yardwork over weekend, often kneeling and planting floors and gardening. Today seems to be persistent. Describes pain as gradual onset initially, aching, 7-8/10 with intermittent worsening worse with extra activity and walking. No pain radiating to legs. - Taking Ibuprofen 200mg  per dose 1-3 times daily as needed, good relief. Not taking Tylenol. - Tried heating pad at night with some relief. Not tried ice packs - No known prior diagnosis of OA/DJD in knee or other knee problem - Denies any fevers/chills, numbness, tingling, weakness, worsening LE edema, redness, trauma/injury/fall  Social History  Substance Use Topics  . Smoking status: Never Smoker  . Smokeless tobacco: Never Used  . Alcohol use 1.2 oz/week    2 Glasses of wine per week    Review of Systems Per HPI unless specifically indicated above     Objective:    BP 122/63   Pulse (!) 110   Temp (!) 97.5 F (36.4 C) (Oral)   Resp 16   Ht 5\' 8"  (1.727 m)   Wt 177 lb 12.8 oz (80.6 kg)   BMI 27.03 kg/m   Wt Readings from Last 3 Encounters:  11/09/16 177 lb 12.8 oz (80.6 kg)  08/18/16 178 lb (80.7 kg)  06/03/16 176 lb (79.8 kg)    Physical Exam  Constitutional: He is oriented to person, place, and time. He appears well-developed and well-nourished. No distress.  Well-appearing, comfortable, cooperative  HENT:  Head: Normocephalic and atraumatic.  Mouth/Throat: Oropharynx is clear and moist.  Eyes: Conjunctivae are normal. Right eye exhibits no discharge. Left eye exhibits no discharge.  Cardiovascular: Intact distal pulses.    Tachycardia  Pulmonary/Chest: Effort normal.  Musculoskeletal: He exhibits no edema.  Bilateral Knees (focus on L knee) Inspection: Bilateral symmetrical abnormal appearance with some mild joint fullness possible soft tissue edema vs effusion and noticeable bony prominence of superior tibia, inferior to patella. No ecchymosis or effusion. Palpation: L knee localized tenderness posterior lateral over tendon insertion. Otherwise joint line L knee non tender. Bilateral bony prominence inferior non tender. Mild crepitus b/l ROM: Some reduced ROM full L knee extension with discomfort, flexion is normal Special Testing: Lachman / Valgus/Varus tests negative with intact ligaments (ACL, MCL, LCL). Standing Thessaly negative for meniscus testing. Strength: 5/5 intact knee flex/ext, ankle dorsi/plantarflex Neurovascular: distally intact sensation light touch and pulses  Neurological: He is alert and oriented to person, place, and time.  Skin: Skin is warm and dry. No rash noted. He is not diaphoretic. No erythema.  Psychiatric: He has a normal mood and affect. His behavior is normal.  Well groomed, good eye contact, normal speech and thoughts  Nursing note and vitals reviewed.      Assessment & Plan:   Problem List Items Addressed This Visit    None    Visit Diagnoses    Acute pain of left knee    -  Primary  Acute L posterior lateral Knee pain w/o swelling without known injury or trauma but likely secondary to overuse and possible MSK muscle tendon strain.  - Concern for  OA/DJD given age 16, but no known prior knee injury - Not supportive of ligament damage or meniscus injury - Able to bear weight, no knee instability or mechanical locking - No prior history of knee surgery, arthroscopy - Inadequate conservative therapy   Plan: 1. Start Tylenol 500-1000mg  per dose recommend 1g TID regularly for 1-2 weeks then may take PRN 2. Reviewed preference to avoid chronic NSAIDs, can use some  occasional PRN in moderation 3. Emphasized RICE therapy (rest, ice, compression with knee sleeve OTC, elevation) for swelling, activity modification 4. Hold imaging unlikely fracture based on mechanism - consider imaging in future if persistent 5. Follow-up 6 weeks, if still worsening, consider X-rays, steroid injection and referral to PT vs Ortho for further eval       No orders of the defined types were placed in this encounter.   Follow up plan: Return in about 6 weeks (around 12/21/2016), or if symptoms worsen or fail to improve, for Left knee pain.  Nobie Putnam, [facility] 11/10/2016, 12:06 AM

## 2016-11-09 NOTE — Patient Instructions (Addendum)
Thank you for coming to the clinic today.  1. You most likely have knee arthritis of joint, and also possible flare up with muscle tendonitis or spasm  I do not think you have a torn meniscus or ligament  No X-rays today, can do this in future if need  Recommend to start taking Tylenol Extra Strength 500mg  tabs - take 1 to 2 tabs per dose (max 1000mg ) every 6-8 hours for pain (take regularly, don't skip a dose for next 7 days), max 24 hour daily dose is 6 tablets or 3000mg . In the future you can repeat the same everyday Tylenol course for 1-2 weeks at a time.  - Tylenol is safer for kidney and heart  If absolutely needed you can take the Ibuprofen 1-2 pills as needed, but this one is riskier for heart and kidneys  Use RICE therapy: - R - Rest / relative rest with activity modification avoid overuse and frequent bending or pressure on bent knee - I - Ice packs (make sure you use a towel or sock / something to protect skin) - C - Compression with flexible Knee Sleeve to apply pressure and reduce swelling allowing more support - E - Elevation - if significant swelling, lift leg above heart level (toes above your nose) to help reduce swelling, most helpful at night after day of being on your feet  Please schedule a Follow-up Appointment to: Return in about 6 weeks (around 12/21/2016), or if symptoms worsen or fail to improve, for Left knee pain.  If you have any other questions or concerns, please feel free to call the clinic or send a message through [place]. You may also schedule an earlier appointment if necessary.  Additionally, you may be receiving a survey about your experience at our clinic within a few days to 1 week by e-mail or mail. We value your feedback.  Nobie Putnam, DO [place]

## 2016-11-18 DIAGNOSIS — H01009 Unspecified blepharitis unspecified eye, unspecified eyelid: Secondary | ICD-10-CM | POA: Diagnosis not present

## 2016-11-18 DIAGNOSIS — Z961 Presence of intraocular lens: Secondary | ICD-10-CM | POA: Diagnosis not present

## 2016-11-18 DIAGNOSIS — H26493 Other secondary cataract, bilateral: Secondary | ICD-10-CM | POA: Diagnosis not present

## 2016-11-19 ENCOUNTER — Ambulatory Visit: Payer: Medicare Other | Admitting: Family Medicine

## 2016-11-30 DIAGNOSIS — M9904 Segmental and somatic dysfunction of sacral region: Secondary | ICD-10-CM | POA: Diagnosis not present

## 2016-11-30 DIAGNOSIS — M9903 Segmental and somatic dysfunction of lumbar region: Secondary | ICD-10-CM | POA: Diagnosis not present

## 2016-11-30 DIAGNOSIS — M545 Low back pain: Secondary | ICD-10-CM | POA: Diagnosis not present

## 2016-11-30 DIAGNOSIS — M546 Pain in thoracic spine: Secondary | ICD-10-CM | POA: Diagnosis not present

## 2016-11-30 DIAGNOSIS — M9902 Segmental and somatic dysfunction of thoracic region: Secondary | ICD-10-CM | POA: Diagnosis not present

## 2016-12-01 ENCOUNTER — Encounter: Payer: Self-pay | Admitting: Family Medicine

## 2016-12-01 ENCOUNTER — Ambulatory Visit (INDEPENDENT_AMBULATORY_CARE_PROVIDER_SITE_OTHER): Payer: Medicare Other | Admitting: Family Medicine

## 2016-12-01 VITALS — BP 134/64 | HR 60 | Temp 97.8°F | Resp 16 | Ht 68.0 in | Wt 180.0 lb

## 2016-12-01 DIAGNOSIS — R7303 Prediabetes: Secondary | ICD-10-CM | POA: Diagnosis not present

## 2016-12-01 DIAGNOSIS — I10 Essential (primary) hypertension: Secondary | ICD-10-CM | POA: Diagnosis not present

## 2016-12-01 DIAGNOSIS — E782 Mixed hyperlipidemia: Secondary | ICD-10-CM | POA: Diagnosis not present

## 2016-12-01 LAB — POCT GLYCOSYLATED HEMOGLOBIN (HGB A1C): HEMOGLOBIN A1C: 6 — AB (ref ?–5.7)

## 2016-12-01 NOTE — Progress Notes (Signed)
Subjective:    Patient ID: Clinton Joseph, male    DOB: 23-Oct-1927, 83 y.o.   MRN: [id number]  Clinton Joseph is a 83 y.o. male presenting on 12/01/2016 for Hypertension   HPI   CHRONIC HTN: Not checking BP at home, has cuff Current Meds - Losartan 50mg  - takes HALF tab dose 25mg  daily   Reports good compliance, took meds today. Tolerating well, w/o complaints. Lifestyle: - Exercise: has a stationary bike got 2 weeks ago, rides it x 2 daily up to 3 miles per, previously going to [facility] - Diet: No changes, does not add salt, not sure on sodium food labels, wife does most of food prep Denies CP, dyspnea, HA, edema, dizziness / lightheadedness  PMH CKD-III  Pre-Diabetes Reports no concerns, attributes mild inc A1c to inc sweets CBGs: none Meds: None Currently on ARB Lifestyle: - Diet (tries to adhere to DM diet, but eats sweets)  - Exercise (regular walking) Denies hypoglycemia, polyuria, visual changes, numbness or tingling.  HYPERLIPIDEMIA: - Last lipids 05/2016 with some elevated LDL 129, and mild low HDL 39, and borderline elevated TG 151. - he does not recall ever taking a Statin medication, has not discussed this with prior PCP or Cardiology by hist report, he thinks his wife may be taking Simvastatin. He has no known allergies or intolerance to statin therapy - Takes OTC Fish Oil long time, 1 pill twice daily   History of non-obstructive CAD / Complete Heart Block (s/p pacer) history of VT - Followed by Cardiology (Dr Rockey Situ and Dr Caryl Comes for pacer)  Health Maintenance: - Due for Flu Shot in Fall 2018 - will return  Social History  Substance Use Topics  . Smoking status: Never Smoker  . Smokeless tobacco: Never Used  . Alcohol use 1.2 oz/week    2 Glasses of wine per week    Review of Systems Per HPI unless specifically indicated above     Objective:    BP 134/64 (BP Location: Right Arm, Cuff Size: Normal)   Pulse 60   Temp 97.8 F (36.6 C)  (Oral)   Resp 16   Ht 5\' 8"  (1.727 m)   Wt 180 lb (81.6 kg)   BMI 27.37 kg/m   Wt Readings from Last 3 Encounters:  12/01/16 180 lb (81.6 kg)  11/09/16 177 lb 12.8 oz (80.6 kg)  08/18/16 178 lb (80.7 kg)    Physical Exam  Constitutional: He is oriented to person, place, and time. He appears well-developed and well-nourished. No distress.  Well-appearing 83 year old elderly male, comfortable, cooperative  HENT:  Head: Normocephalic and atraumatic.  Mouth/Throat: Oropharynx is clear and moist.  Eyes: Conjunctivae are normal. Right eye exhibits no discharge. Left eye exhibits no discharge.  Neck: Normal range of motion. Neck supple. No thyromegaly present.  Cardiovascular: Normal rate, regular rhythm, normal heart sounds and intact distal pulses.   No murmur heard. Pulmonary/Chest: Effort normal and breath sounds normal. No respiratory distress. He has no wheezes. He has no rales.  Musculoskeletal: Normal range of motion. He exhibits no edema.  Lymphadenopathy:    He has no cervical adenopathy.  Neurological: He is alert and oriented to person, place, and time.  Skin: Skin is warm and dry. No rash noted. He is not diaphoretic. No erythema.  Psychiatric: He has a normal mood and affect. His behavior is normal.  Well groomed, good eye contact, normal speech and thoughts  Nursing note and vitals reviewed.  Recent Labs  01/30/16 0948 06/12/16 0001 12/01/16 1109  HGBA1C 5.8% 5.8* 6.0*      Results for orders placed or performed in visit on 12/01/16  POCT HgB A1C  Result Value Ref Range   Hemoglobin A1C 6.0 (A) 5.7      Assessment & Plan:   Problem List Items Addressed This Visit    Pre-diabetes - Primary    Stable, mild inc A1c 5.8 to 6.0, some inc sweets Not on medications. Controlled with lifestyle Complicated by CKD-III, CAD  Plan: 1. Encouraged improved exercise with stationary bike as planned, continue to reduce carbs in diet and sweets 2. Follow-up to  monitor A1c q 3 months still, then in future can space out after 05/2016 with labs      Relevant Orders   POCT HgB A1C (Completed)   Hyperlipidemia    Stable without change Advised to discuss restarting statin therapy with Cardiology at next apt      Essential hypertension    Improved controlled HTN - Home BP readings none - will start checking  Complication with CKD-III, CAD   Plan:  1. Continue current BP regimen - Losartan half tab 25mg  daily 2. Encourage improved lifestyle - low sodium diet, regular exercise 3. Start monitor BP outside office, bring readings to next visit, if persistently >140/90 or new symptoms notify office sooner 4. Follow-up 3 months, and with Cardiology soon         No orders of the defined types were placed in this encounter.   Follow up plan: Return in about 3 months (around 03/03/2017) for Pre-Diabetes A1c, HTN, HLD.  Nobie Putnam, [facility] 12/02/2016, 6:57 AM

## 2016-12-01 NOTE — Patient Instructions (Addendum)
Thank you for coming to the clinic today.  1. Blood pressure was elevated today, improved on manual cuff check, but still elevated. - Try to start checking BP at home 2-3 times weekly for now, if persistently >140/90 can check more often and notify office, otherwise if improved may check 1-2 times weekly - No change to medicines today  2. Discussed elevated cholesterol numbers and goal to reduce risk of Heart Attack and Stroke - Please consider starting some low dose Statin Cholesterol medication (example, Lipitor,Atorvastatin, Crestor, Simvastatin) in future. You may read about this or discuss it with Dr Rockey Situ at next visit  3. Pre-Diabetes - we are keeping track of your blood sugar. Important to eat low sugar low carb diet and keep exercising. Next check in 3 months.  Please schedule a Follow-up Appointment to: Return in about 3 months (around 03/03/2017) for Pre-Diabetes A1c, HTN, HLD.  If you have any other questions or concerns, please feel free to call the clinic or send a message through [place]. You may also schedule an earlier appointment if necessary.  Additionally, you may be receiving a survey about your experience at our clinic within a few days to 1 week by e-mail or mail. We value your feedback.  Nobie Putnam, DO [place]

## 2016-12-02 NOTE — Assessment & Plan Note (Signed)
Stable without change Advised to discuss restarting statin therapy with Cardiology at next apt

## 2016-12-02 NOTE — Assessment & Plan Note (Signed)
Stable, mild inc A1c 5.8 to 6.0, some inc sweets Not on medications. Controlled with lifestyle Complicated by CKD-III, CAD  Plan: 1. Encouraged improved exercise with stationary bike as planned, continue to reduce carbs in diet and sweets 2. Follow-up to monitor A1c q 3 months still, then in future can space out after 05/2016 with labs

## 2016-12-02 NOTE — Assessment & Plan Note (Signed)
Improved controlled HTN - Home BP readings none - will start checking  Complication with CKD-III, CAD   Plan:  1. Continue current BP regimen - Losartan half tab 25mg  daily 2. Encourage improved lifestyle - low sodium diet, regular exercise 3. Start monitor BP outside office, bring readings to next visit, if persistently >140/90 or new symptoms notify office sooner 4. Follow-up 3 months, and with Cardiology soon

## 2016-12-16 ENCOUNTER — Ambulatory Visit (INDEPENDENT_AMBULATORY_CARE_PROVIDER_SITE_OTHER): Payer: Medicare Other | Admitting: *Deleted

## 2016-12-16 DIAGNOSIS — I442 Atrioventricular block, complete: Secondary | ICD-10-CM

## 2016-12-17 NOTE — Progress Notes (Signed)
Remote pacemaker transmission.   

## 2016-12-25 ENCOUNTER — Encounter: Payer: Self-pay | Admitting: Cardiology

## 2016-12-28 ENCOUNTER — Ambulatory Visit (INDEPENDENT_AMBULATORY_CARE_PROVIDER_SITE_OTHER): Payer: Medicare Other | Admitting: Family Medicine

## 2016-12-28 ENCOUNTER — Encounter: Payer: Self-pay | Admitting: Family Medicine

## 2016-12-28 VITALS — BP 131/75 | HR 103 | Temp 97.8°F | Resp 16 | Ht 68.0 in | Wt 179.0 lb

## 2016-12-28 DIAGNOSIS — B9789 Other viral agents as the cause of diseases classified elsewhere: Secondary | ICD-10-CM | POA: Diagnosis not present

## 2016-12-28 DIAGNOSIS — J069 Acute upper respiratory infection, unspecified: Secondary | ICD-10-CM

## 2016-12-28 MED ORDER — BENZONATATE 100 MG PO CAPS: 100.0000 mg | ORAL_CAPSULE | Freq: Three times a day (TID) | ORAL | 0 refills | Status: DC | PRN

## 2016-12-28 MED ORDER — IPRATROPIUM BROMIDE 0.06 % NA SOLN: 2.0000 | Freq: Four times a day (QID) | NASAL | 0 refills | Status: DC

## 2016-12-28 NOTE — Progress Notes (Signed)
Subjective:    Patient ID: Clinton Joseph, male    DOB: Oct 30, 1927, 83 y.o.   MRN: [id number]  Clinton Joseph is a 83 y.o. male presenting on 12/28/2016 for Cough (nasal congestion, HA , no fever, ear pain, throat hurts from coughing onset yesterday)  Patient presents for a same day appointment.  HPI   URI COUGH CONGESTION - Reports symptoms started yesterday while at church, describes coughing and then worsening with some congestion and sore throat hoarse voice. Difficulty sleeping at night last night due to cough, now still has same symptoms, not worse or not improved - Not taking any other OTC cold medicines. Tried Flonase for past day with limited results. In past was on Atrovent with good decongestant results. Also in past Z-pak helped resolve bronchitis. - Normal PO intake - Denies any fever/chills, sweats, nausea vomiting  Health Maintenance: - Due for Flu Shot, but ill today  Social History  Substance Use Topics  . Smoking status: Never Smoker  . Smokeless tobacco: Never Used  . Alcohol use 1.2 oz/week    2 Glasses of wine per week    Review of Systems Per HPI unless specifically indicated above     Objective:    BP 131/75   Pulse (!) 103   Temp 97.8 F (36.6 C) (Oral)   Resp 16   Ht 5\' 8"  (1.727 m)   Wt 179 lb (81.2 kg)   SpO2 99%   BMI 27.22 kg/m   Wt Readings from Last 3 Encounters:  12/28/16 179 lb (81.2 kg)  12/01/16 180 lb (81.6 kg)  11/09/16 177 lb 12.8 oz (80.6 kg)    Physical Exam  Constitutional: He is oriented to person, place, and time. He appears well-developed and well-nourished. No distress.  Slightly tired appearing, mostly comfortable, cooperative  HENT:  Head: Normocephalic and atraumatic.  Mouth/Throat: Oropharynx is clear and moist.  Frontal / maxillary sinuses non-tender. Nares patent without purulence or edema. Bilateral TMs clear without erythema, effusion or bulging (removed hearing aids). Oropharynx clear without  erythema, exudates, edema or asymmetry.  Eyes: Conjunctivae are normal. Right eye exhibits no discharge. Left eye exhibits no discharge.  Neck: Normal range of motion. Neck supple. No thyromegaly present.  Cardiovascular: Regular rhythm, normal heart sounds and intact distal pulses.   No murmur heard. Mild tachycardia  Pulmonary/Chest: Effort normal and breath sounds normal. No respiratory distress. He has no wheezes. He has no rales.  Occasional coughing, speaks full sentences  Musculoskeletal: Normal range of motion. He exhibits no edema.  Lymphadenopathy:    He has no cervical adenopathy.  Neurological: He is alert and oriented to person, place, and time.  Skin: Skin is warm and dry. No rash noted. He is not diaphoretic. No erythema.  Psychiatric: His behavior is normal.  Well groomed, good eye contact, normal speech and thoughts  Nursing note and vitals reviewed.  Results for orders placed or performed in visit on 12/01/16  POCT HgB A1C  Result Value Ref Range   Hemoglobin A1C 6.0 (A) 5.7      Assessment & Plan:   Problem List Items Addressed This Visit    None    Visit Diagnoses    Viral URI with cough    -  Primary  Consistent with viral URI acute onset 24 hours without known sick contact Afebrile, well hydrated on exam, no focal signs of infection (ears, throat, lungs clear)  Plan: 1. Reassurance, likely self-limited  - Start Atrovent nasal  spray decongestant 2 sprays each nostril up to 4 times daily for 5-7 days - Start Tessalon Perls take 1 capsule up to 3 times a day as needed for cough - Continue recently resumed nasal steroid Flonase 2 sprays in each nostril daily for 4-6 weeks, may repeat course seasonally or as needed - If not improved, add Mucinex-DM OTC up to 7-10 days then stop 2. Supportive care with nasal saline, warm herbal tea with honey, 3. Improve hydration 4. Tylenol PRN fevers 5. Return criteria given  Defer flu shot today due to illness, will  return to schedule nurse visit high dose flu shot within 3-4 weeks    Relevant Medications   ipratropium (ATROVENT) 0.06 % nasal spray   benzonatate (TESSALON) 100 MG capsule      Meds ordered this encounter  Medications  . ipratropium (ATROVENT) 0.06 % nasal spray    Sig: Place 2 sprays into both nostrils 4 (four) times daily. For up to 5-7 days then stop.    Dispense:  15 mL    Refill:  0  . benzonatate (TESSALON) 100 MG capsule    Sig: Take 1 capsule (100 mg total) by mouth 3 (three) times daily as needed for cough.    Dispense:  30 capsule    Refill:  0    Follow up plan: Return in about 4 weeks (around 01/25/2017), or if symptoms worsen or fail to improve, for URI.  Nobie Putnam, [facility] 12/28/2016, 11:03 AM

## 2016-12-28 NOTE — Patient Instructions (Addendum)
Thank you for coming to the clinic today.  1. It sounds like you have a Upper Respiratory Virus - this will most likely run it's course in 7 to 10 days. Recommend good hand washing. - Start Atrovent nasal spray decongestant 2 sprays each nostril up to 4 times daily for 5-7 days - Start Tessalon Perls take 1 capsule up to 3 times a day as needed for cough - Continue nasal steroid Flonase 2 sprays in each nostril daily for 4-6 weeks, may repeat course seasonally or as needed - If congestion is worse, start OTC Mucinex (or may try Mucinex-DM for cough) up to 7-10 days then stop - Drink plenty of fluids to improve congestion - You may try over the counter Nasal Saline spray (Simply Saline, Ocean Spray) as needed to reduce congestion. - Drink warm herbal tea with honey for sore throat - Start taking Tylenol extra strength 1 to 2 tablets every 6-8 hours for aches or fever/chills for next few days as needed. May take Ibuprofen as well if tolerated 200-400mg  every 8 hours as needed.  If symptoms significantly worsening with persistent fevers/chills despite tylenol/ibpurofen, nausea, vomiting unable to tolerate food/fluids or medicine, body aches, or shortness of breath, sinus pain pressure or worsening productive cough, then follow-up for re-evaluation, may seek more immediate care at Urgent Care or ED if more concerned for emergency.  If not improved by Thurs/Fri may call back and consider antibiotic Z-pak  Please schedule a Follow-up Appointment to: Return in about 4 weeks (around 01/25/2017), or if symptoms worsen or fail to improve, for URI.  If you have any other questions or concerns, please feel free to call the clinic or send a message through [place]. You may also schedule an earlier appointment if necessary.  Additionally, you may be receiving a survey about your experience at our clinic within a few days to 1 week by e-mail or mail. We value your feedback.  Nobie Putnam, DO [place]

## 2016-12-29 ENCOUNTER — Encounter: Payer: Self-pay | Admitting: Internal Medicine

## 2016-12-29 ENCOUNTER — Ambulatory Visit (INDEPENDENT_AMBULATORY_CARE_PROVIDER_SITE_OTHER): Payer: Medicare Other | Admitting: Internal Medicine

## 2016-12-29 VITALS — BP 160/72 | HR 70 | Ht 68.0 in | Wt 180.0 lb

## 2016-12-29 DIAGNOSIS — I442 Atrioventricular block, complete: Secondary | ICD-10-CM | POA: Diagnosis not present

## 2016-12-29 DIAGNOSIS — Z950 Presence of cardiac pacemaker: Secondary | ICD-10-CM | POA: Diagnosis not present

## 2016-12-29 DIAGNOSIS — I471 Supraventricular tachycardia: Secondary | ICD-10-CM | POA: Diagnosis not present

## 2016-12-29 NOTE — Patient Instructions (Signed)
Medication Instructions: - Your physician recommends that you continue on your current medications as directed. Please refer to the Current Medication list given to you today.  Labwork: - none ordered  Procedures/Testing: - none ordered  Follow-Up: - Remote monitoring is used to monitor your Pacemaker of ICD from home. This monitoring reduces the number of office visits required to check your device to one time per year. It allows [place] to keep an eye on the functioning of your device to ensure it is working properly. You are scheduled for a device check from home on 03/17/17. You may send your transmission at any time that day. If you have a wireless device, the transmission will be sent automatically. After your physician reviews your transmission, you will receive a postcard with your next transmission date.  - Your physician wants you to follow-up in: 1 year with Dr. Caryl Comes. You will receive a reminder letter in the mail two months in advance. If you don't receive a letter, please call our office to schedule the follow-up appointment.  Any Additional Special Instructions Will Be Listed Below (If Applicable).     If you need a refill on your cardiac medications before your next appointment, please call your pharmacy.

## 2016-12-29 NOTE — Progress Notes (Signed)
Patient Care Team: Olin Hauser, DO as PCP - General (Family Medicine) Minna Merritts, MD as Consulting Physician (Cardiology)   HPI  Clinton Joseph is a 83 y.o. male Seen in follow-up for pacemaker implanted 8/15 by Dr. Greggory Brandy for syncope and Mobitz 2 heart block and intermittent complete heart block and pauses of greater than 6 seconds.  He has mildly obstructive coronary disease by cath 2012. Echocardiogram 8/15 demonstrated EF of 55-60% with aortic sclerosis without stenosis.  The patient denies chest pain, shortness of breath, nocturnal dyspnea, orthopnea or peripheral edema.  There have been no palpitations, lightheadedness or syncope.     Past Medical History:  Diagnosis Date  . Anxiety state, unspecified   . Arthritis    a. Hands.  . Diffuse large B cell lymphoma ([place])    a. 2007 s/p chemo - followed by Dr. Jeb Levering.  . H/O echocardiogram    a. 11/2013 Echo: EF 55-60%, mildly dil LA, mild to mod Ao sclerosis.  . Hyperlipidemia   . Hypertensive heart disease   . Lymphoma ([place])   . Mobitz II    a. 11/2013 syncope-->s/p SJM Assurity DR model [id number] (ser # [id number]).  . Non-obstructive CAD    a. 10/2010 Cath: >M nl, LAD/LCX min irregs, RCA 40p, EF 60%.  . Tremor    a. right hand  . Urticaria, unspecified   . Ventricular tachycardia (HCC)    a. 2012->broke with amio, neg w/u.    Past Surgical History:  Procedure Laterality Date  . APPENDECTOMY  1948  . CARDIAC CATHETERIZATION  11/17/2010   [facility]- LAD min irregs, LCX min irregs, RCA 40p, EF 60%.   Marland Kitchen CATARACT EXTRACTION, BILATERAL Bilateral ~ 2012  . INSERT / REPLACE / REMOVE PACEMAKER  12/05/2013  . PERMANENT PACEMAKER INSERTION N/A 12/05/2013   Procedure: PERMANENT PACEMAKER INSERTION;  Surgeon: Coralyn Mark, MD;  Location: [facility] CATH LAB;  Service: Cardiovascular;  Laterality: N/A;    Current Outpatient Prescriptions  Medication Sig Dispense Refill  . aspirin 81 MG tablet Take 81 mg by mouth  daily.     . benzonatate (TESSALON) 100 MG capsule Take 1 capsule (100 mg total) by mouth 3 (three) times daily as needed for cough. 30 capsule 0  . cetirizine (ZYRTEC) 10 MG tablet Take 10 mg by mouth daily.    . Cholecalciferol (VITAMIN D PO) Take 2 tablets by mouth daily.    . ferrous sulfate 325 (65 FE) MG tablet Take 325 mg by mouth daily with breakfast.    . fluticasone (FLONASE) 50 MCG/ACT nasal spray Place 2 sprays into both nostrils daily as needed for allergies or rhinitis. 48 g 3  . ipratropium (ATROVENT) 0.06 % nasal spray Place 2 sprays into both nostrils 4 (four) times daily. For up to 5-7 days then stop. 15 mL 0  . losartan (COZAAR) 50 MG tablet Take 1 tablet (50 mg total) by mouth daily. 90 tablet 3  . Multiple Vitamins-Minerals (MULTIVITAMIN WITH MINERALS) tablet Take 1 tablet by mouth daily.     . Omega-3 Fatty Acids (FISH OIL) 1000 MG CAPS Take 1,200 mg by mouth 2 (two) times daily.     . Skin Protectants, Misc. (EUCERIN) cream Apply 1 application topically daily as needed for dry skin.     Marland Kitchen vitamin C (ASCORBIC ACID) 500 MG tablet Take 500 mg by mouth daily.     No current facility-administered medications for this visit.     Allergies  Allergen Reactions  . Keflex [Cephalexin]     Diarrhea   . Penicillins Hives and Rash  . Tape Rash    Review of Systems negative except from HPI and PMH  Physical Exam BP (!) 160/72 (BP Location: Left Arm, Patient Position: Sitting, Cuff Size: Normal)   Pulse 70   Ht 5\' 8"  (1.727 m)   Wt 180 lb (81.6 kg)   BMI 27.37 kg/m  Well developed and nourished in no acute distress HENT normal Neck supple with JVP-flat Clear Regular rate and rhythm, no murmurs or gallops Abd-soft with active BS No Clubbing cyanosis 2+ edema Skin-warm and dry A & Oriented  Grossly normal sensory and motor function   ECG was ordered today Sinus @ 70 22/13/41  Assessment and  Plan  Complete heart block now with some intrinsic conduction with  prolonged PR interval and bifascicular block  Pacemaker-St. Jude The patient's device was interrogated.  The information was reviewed. No changes were made in the programming.     Syncope  No recurrent syncope  Hypertension  BP at home 130s  Atrial tachycardia  Recurrent conduction  BP at home are ok,  Will not adjust at this time  Edema not an issue,  Not excess salt or fluid intake, so the intervention would be diuretic   We will hold off for now, he is to see Dr Deidre Ala next month and will have him reassess

## 2017-01-06 LAB — CUP PACEART REMOTE DEVICE CHECK
Battery Remaining Longevity: 124 mo
Battery Remaining Percentage: 95.5 %
Battery Voltage: 3.02 V
Brady Statistic AP VP Percent: 1.2 %
Brady Statistic RA Percent Paced: 20 %
Brady Statistic RV Percent Paced: 58 %
Implantable Lead Implant Date: 20150818
Implantable Lead Model: 1948
Implantable Pulse Generator Implant Date: 20150818
Lead Channel Impedance Value: 790 Ohm
Lead Channel Pacing Threshold Amplitude: 0.5 V
Lead Channel Pacing Threshold Pulse Width: 0.5 ms
Lead Channel Pacing Threshold Pulse Width: 0.5 ms
Lead Channel Setting Pacing Amplitude: 0.875
Lead Channel Setting Sensing Sensitivity: 4 mV
MDC IDC LEAD IMPLANT DT: 20150818
MDC IDC LEAD LOCATION: 753859
MDC IDC LEAD LOCATION: 753860
MDC IDC MSMT LEADCHNL RA IMPEDANCE VALUE: 410 Ohm
MDC IDC MSMT LEADCHNL RA SENSING INTR AMPL: 5 mV
MDC IDC MSMT LEADCHNL RV PACING THRESHOLD AMPLITUDE: 0.625 V
MDC IDC MSMT LEADCHNL RV SENSING INTR AMPL: 12 mV
MDC IDC SESS DTM: 20180829060014
MDC IDC SET LEADCHNL RA PACING AMPLITUDE: 2 V
MDC IDC SET LEADCHNL RV PACING PULSEWIDTH: 0.5 ms
MDC IDC STAT BRADY AP VS PERCENT: 20 %
MDC IDC STAT BRADY AS VP PERCENT: 57 %
MDC IDC STAT BRADY AS VS PERCENT: 22 %
Pulse Gen Model: 2240
Pulse Gen Serial Number: 7664739

## 2017-01-07 LAB — CUP PACEART INCLINIC DEVICE CHECK
Battery Remaining Longevity: 126 mo
Battery Voltage: 3.01 V
Implantable Lead Implant Date: 20150818
Implantable Lead Location: 753860
Implantable Pulse Generator Implant Date: 20150818
Lead Channel Impedance Value: 387.5 Ohm
Lead Channel Pacing Threshold Amplitude: 0.5 V
Lead Channel Pacing Threshold Pulse Width: 0.5 ms
Lead Channel Sensing Intrinsic Amplitude: 5 mV
Lead Channel Setting Pacing Amplitude: 0.75 V
Lead Channel Setting Pacing Amplitude: 2 V
Lead Channel Setting Sensing Sensitivity: 4 mV
MDC IDC LEAD IMPLANT DT: 20150818
MDC IDC LEAD LOCATION: 753859
MDC IDC MSMT LEADCHNL RA PACING THRESHOLD AMPLITUDE: 0.5 V
MDC IDC MSMT LEADCHNL RA PACING THRESHOLD PULSEWIDTH: 0.5 ms
MDC IDC MSMT LEADCHNL RV IMPEDANCE VALUE: 762.5 Ohm
MDC IDC MSMT LEADCHNL RV SENSING INTR AMPL: 12 mV
MDC IDC PG SERIAL: 7664739
MDC IDC SESS DTM: 20180911144433
MDC IDC SET LEADCHNL RV PACING PULSEWIDTH: 0.5 ms
MDC IDC STAT BRADY RA PERCENT PACED: 20 %
MDC IDC STAT BRADY RV PERCENT PACED: 58 %

## 2017-01-12 ENCOUNTER — Ambulatory Visit (INDEPENDENT_AMBULATORY_CARE_PROVIDER_SITE_OTHER): Payer: Medicare Other

## 2017-01-12 VITALS — BP 128/74 | HR 60 | Temp 98.4°F | Ht 64.0 in | Wt 179.8 lb

## 2017-01-12 DIAGNOSIS — Z0000 Encounter for general adult medical examination without abnormal findings: Secondary | ICD-10-CM | POA: Diagnosis not present

## 2017-01-12 DIAGNOSIS — Z23 Encounter for immunization: Secondary | ICD-10-CM

## 2017-01-12 NOTE — Progress Notes (Signed)
Subjective:   Clinton Joseph is a 83 y.o. male who presents for Medicare Annual/Subsequent preventive examination.  Review of Systems:   Cardiac Risk Factors include: male gender;advanced age (>21men, >78 women);obesity (BMI >30kg/m2);hypertension;dyslipidemia     Objective:    Vitals: BP 128/74 (BP Location: Right Arm, Patient Position: Sitting)   Pulse 60   Temp 98.4 F (36.9 C)   Ht 5\' 4"  (1.626 m)   Wt 179 lb 12.8 oz (81.6 kg)   BMI 30.86 kg/m   Body mass index is 30.86 kg/m.  Tobacco History  Smoking Status  . Never Smoker  Smokeless Tobacco  . Never Used     Counseling given: Not Answered   Past Medical History:  Diagnosis Date  . Anxiety state, unspecified   . Arthritis    a. Hands.  . Diffuse large B cell lymphoma ([place])    a. 2007 s/p chemo - followed by Dr. Jeb Levering.  . H/O echocardiogram    a. 11/2013 Echo: EF 55-60%, mildly dil LA, mild to mod Ao sclerosis.  . Hyperlipidemia   . Hypertensive heart disease   . Lymphoma ([facility])   . Mobitz II    a. 11/2013 syncope-->s/p SJM Assurity DR model [id number] (ser # [id number]).  . Non-obstructive CAD    a. 10/2010 Cath: >M nl, LAD/LCX min irregs, RCA 40p, EF 60%.  . Tremor    a. right hand  . Urticaria, unspecified   . Ventricular tachycardia (HCC)    a. 2012->broke with amio, neg w/u.   Past Surgical History:  Procedure Laterality Date  . APPENDECTOMY  1948  . CARDIAC CATHETERIZATION  11/17/2010   [facility]- LAD min irregs, LCX min irregs, RCA 40p, EF 60%.   Marland Kitchen CATARACT EXTRACTION, BILATERAL Bilateral ~ 2012  . INSERT / REPLACE / REMOVE PACEMAKER  12/05/2013  . PERMANENT PACEMAKER INSERTION N/A 12/05/2013   Procedure: PERMANENT PACEMAKER INSERTION;  Surgeon: Coralyn Mark, MD;  Location: [facility]ATH [facility];  Service: Cardiovascular;  Laterality: N/A;   Family History  Problem Relation Age of Onset  . Coronary artery disease Mother   . Emphysema Father    History  Sexual Activity  . Sexual activity: Yes     Outpatient Encounter Prescriptions as of 01/12/2017  Medication Sig  . aspirin 81 MG tablet Take 81 mg by mouth daily.   . cetirizine (ZYRTEC) 10 MG tablet Take 10 mg by mouth daily.  . Cholecalciferol (VITAMIN D PO) Take 2 tablets by mouth daily.  . ferrous sulfate 325 (65 FE) MG tablet Take 325 mg by mouth daily with breakfast.  . fluticasone (FLONASE) 50 MCG/ACT nasal spray Place 2 sprays into both nostrils daily as needed for allergies or rhinitis.  Marland Kitchen losartan (COZAAR) 50 MG tablet Take 1 tablet (50 mg total) by mouth daily. (Patient taking differently: Take 50 mg by mouth daily. Takes 1/2 tablet (25mg  )once daily.)  . Magnesium 250 MG TABS Take 250 mg by mouth daily.  . Multiple Vitamins-Minerals (MULTIVITAMIN WITH MINERALS) tablet Take 1 tablet by mouth daily.   . Omega-3 Fatty Acids (FISH OIL) 1000 MG CAPS Take 1,200 mg by mouth 2 (two) times daily.   . Skin Protectants, Misc. (EUCERIN) cream Apply 1 application topically daily as needed for dry skin.   . [DISCONTINUED] benzonatate (TESSALON) 100 MG capsule Take 1 capsule (100 mg total) by mouth 3 (three) times daily as needed for cough. (Patient not taking: Reported on 01/12/2017)  . [DISCONTINUED] ipratropium (ATROVENT) 0.06 % nasal  spray Place 2 sprays into both nostrils 4 (four) times daily. For up to 5-7 days then stop. (Patient not taking: Reported on 01/12/2017)  . [DISCONTINUED] vitamin C (ASCORBIC ACID) 500 MG tablet Take 500 mg by mouth daily.   No facility-administered encounter medications on file as of 01/12/2017.     Activities of Daily Living In your present state of health, do you have any difficulty performing the following activities: 01/12/2017 08/18/2016  Hearing? N Y  Comment - hearing aid R side  Vision? N N  Difficulty concentrating or making decisions? N N  Walking or climbing stairs? N N  Dressing or bathing? N N  Doing errands, shopping? N N  Preparing Food and eating ? N -  Using the Toilet? N -  In the  past six months, have you accidently leaked urine? N -  Do you have problems with loss of bowel control? N -  Managing your Medications? N -  Managing your Finances? N -  Housekeeping or managing your Housekeeping? N -  Some recent data might be hidden    Patient Care Team: Olin Hauser, DO as PCP - General (Family Medicine) Minna Merritts, MD as Consulting Physician (Cardiology)   Assessment:     Exercise Activities and Dietary recommendations Current Exercise Habits: Home exercise routine, Time (Minutes): 20, Frequency (Times/Week): 3, Weekly Exercise (Minutes/Week): 60, Intensity: Mild, Exercise limited by: None identified  Goals    . Increase water intake          Recommend drinking at least 2-3 glasses of water a day       Fall Risk Fall Risk  01/12/2017 08/18/2016 10/14/2015 02/04/2015  Falls in the past year? No No No No   Depression Screen PHQ 2/9 Scores 01/12/2017 08/18/2016 10/14/2015 02/04/2015  PHQ - 2 Score 0 0 0 0    Cognitive Function     6CIT Screen 01/12/2017  What Year? 0 points  What month? 0 points  What time? 0 points  Count back from 20 0 points  Months in reverse 0 points  Repeat phrase 2 points  Total Score 2    Immunization History  Administered Date(s) Administered  . Influenza, High Dose Seasonal PF 03/11/2015, 01/29/2016, 01/12/2017  . Influenza-Unspecified 01/18/2014  . Pneumococcal Conjugate-13 12/18/2013  . Pneumococcal-Unspecified 12/20/2011  . Tdap 11/20/2013   Screening Tests Health Maintenance  Topic Date Due  . TETANUS/TDAP  11/21/2023  . INFLUENZA VACCINE  Completed  . PNA vac Low Risk Adult  Completed      Plan:    I have personally reviewed and addressed the Medicare Annual Wellness questionnaire and have noted the following in the patient's chart:  A. Medical and social history B. Use of alcohol, tobacco or illicit drugs  C. Current medications and supplements D. Functional ability and status E.   Nutritional status F.  Physical activity G. Advance directives H. List of other physicians I.  Hospitalizations, surgeries, and ER visits in previous 12 months J.  [place] such as hearing and vision if needed, cognitive and depression L. Referrals and appointments   In addition, I have reviewed and discussed with patient certain preventive protocols, quality metrics, and best practice recommendations. A written personalized care plan for preventive services as well as general preventive health recommendations were provided to patient.   Signed,  Tyler Aas, LPN Nurse Health Advisor   MD Recommendations: none

## 2017-01-12 NOTE — Patient Instructions (Addendum)
[name] , Thank you for taking time to come for your Medicare Wellness Visit. I appreciate your ongoing commitment to your health goals. Please review the following plan we discussed and let me know if I can assist you in the future.   Screening recommendations/referrals: Colonoscopy: no longer required Recommended yearly ophthalmology/optometry visit for glaucoma screening and checkup Recommended yearly dental visit for hygiene and checkup  Vaccinations: Influenza vaccine: done today   Pneumococcal vaccine: up to date Tdap vaccine: up to date Shingles vaccine: due, check with your insurance for coverage   Advanced directives: Please bring a copy of your health care power of attorney and living will to the office at your convenience.  Conditions/risks identified: Recommend drinking at least 2-3 glasses of water a day   Next appointment: Follow up on 03/09/2017 at 10:40am with Dr.Karamalegos. Follow up in one year for your annual wellness exam.   Preventive Care 81 Years and Older, Male Preventive care refers to lifestyle choices and visits with your health care provider that can promote health and wellness. What does preventive care include?  A yearly physical exam. This is also called an annual well check.  Dental exams once or twice a year.  Routine eye exams. Ask your health care provider how often you should have your eyes checked.  Personal lifestyle choices, including:  Daily care of your teeth and gums.  Regular physical activity.  Eating a healthy diet.  Avoiding tobacco and drug use.  Limiting alcohol use.  Practicing safe sex.  Taking low doses of aspirin every day.  Taking vitamin and mineral supplements as recommended by your health care provider. What happens during an annual well check? The services and screenings done by your health care provider during your annual well check will depend on your age, overall health, lifestyle risk factors, and  family history of disease. Counseling  Your health care provider may ask you questions about your:  Alcohol use.  Tobacco use.  Drug use.  Emotional well-being.  Home and relationship well-being.  Sexual activity.  Eating habits.  History of falls.  Memory and ability to understand (cognition).  Work and work Statistician. Screening  You may have the following tests or measurements:  Height, weight, and BMI.  Blood pressure.  Lipid and cholesterol levels. These may be checked every 5 years, or more frequently if you are over 22 years old.  Skin check.  Lung cancer screening. You may have this screening every year starting at age 46 if you have a 30-pack-year history of smoking and currently smoke or have quit within the past 15 years.  Fecal occult blood test (FOBT) of the stool. You may have this test every year starting at age 58.  Flexible sigmoidoscopy or colonoscopy. You may have a sigmoidoscopy every 5 years or a colonoscopy every 10 years starting at age 21.  Prostate cancer screening. Recommendations will vary depending on your family history and other risks.  Hepatitis C blood test.  Hepatitis B blood test.  Sexually transmitted disease (STD) testing.  Diabetes screening. This is done by checking your blood sugar (glucose) after you have not eaten for a while (fasting). You may have this done every 1-3 years.  Abdominal aortic aneurysm (AAA) screening. You may need this if you are a current or former smoker.  Osteoporosis. You may be screened starting at age 78 if you are at high risk. Talk with your health care provider about your test results, treatment options, and if necessary,  the need for more tests. Vaccines  Your health care provider may recommend certain vaccines, such as:  Influenza vaccine. This is recommended every year.  Tetanus, diphtheria, and acellular pertussis (Tdap, Td) vaccine. You may need a Td booster every 10 years.  Zoster  vaccine. You may need this after age 85.  Pneumococcal 13-valent conjugate (PCV13) vaccine. One dose is recommended after age 48.  Pneumococcal polysaccharide (PPSV23) vaccine. One dose is recommended after age 83. Talk to your health care provider about which screenings and vaccines you need and how often you need them. This information is not intended to replace advice given to you by your health care provider. Make sure you discuss any questions you have with your health care provider. Document Released: 05/03/2015 Document Revised: 12/25/2015 Document Reviewed: 02/05/2015 Elsevier Interactive Patient Education  2017 [employer] Prevention in the Home Falls can cause injuries. They can happen to people of all ages. There are many things you can do to make your home safe and to help prevent falls. What can I do on the outside of my home?  Regularly fix the edges of walkways and driveways and fix any cracks.  Remove anything that might make you trip as you walk through a door, such as a raised step or threshold.  Trim any bushes or trees on the path to your home.  Use bright outdoor lighting.  Clear any walking paths of anything that might make someone trip, such as rocks or tools.  Regularly check to see if handrails are loose or broken. Make sure that both sides of any steps have handrails.  Any raised decks and porches should have guardrails on the edges.  Have any leaves, snow, or ice cleared regularly.  Use sand or salt on walking paths during winter.  Clean up any spills in your garage right away. This includes oil or grease spills. What can I do in the bathroom?  Use night lights.  Install grab bars by the toilet and in the tub and shower. Do not use towel bars as grab bars.  Use non-skid mats or decals in the tub or shower.  If you need to sit down in the shower, use a plastic, non-slip stool.  Keep the floor dry. Clean up any water that spills on the  floor as soon as it happens.  Remove soap buildup in the tub or shower regularly.  Attach bath mats securely with double-sided non-slip rug tape.  Do not have throw rugs and other things on the floor that can make you trip. What can I do in the bedroom?  Use night lights.  Make sure that you have a light by your bed that is easy to reach.  Do not use any sheets or blankets that are too big for your bed. They should not hang down onto the floor.  Have a firm chair that has side arms. You can use this for support while you get dressed.  Do not have throw rugs and other things on the floor that can make you trip. What can I do in the kitchen?  Clean up any spills right away.  Avoid walking on wet floors.  Keep items that you use a lot in easy-to-reach places.  If you need to reach something above you, use a strong step stool that has a grab bar.  Keep electrical cords out of the way.  Do not use floor polish or wax that makes floors slippery. If you must use  wax, use non-skid floor wax.  Do not have throw rugs and other things on the floor that can make you trip. What can I do with my stairs?  Do not leave any items on the stairs.  Make sure that there are handrails on both sides of the stairs and use them. Fix handrails that are broken or loose. Make sure that handrails are as long as the stairways.  Check any carpeting to make sure that it is firmly attached to the stairs. Fix any carpet that is loose or worn.  Avoid having throw rugs at the top or bottom of the stairs. If you do have throw rugs, attach them to the floor with carpet tape.  Make sure that you have a light switch at the top of the stairs and the bottom of the stairs. If you do not have them, ask someone to add them for you. What else can I do to help prevent falls?  Wear shoes that:  Do not have high heels.  Have rubber bottoms.  Are comfortable and fit you well.  Are closed at the toe. Do not wear  sandals.  If you use a stepladder:  Make sure that it is fully opened. Do not climb a closed stepladder.  Make sure that both sides of the stepladder are locked into place.  Ask someone to hold it for you, if possible.  Clearly mark and make sure that you can see:  Any grab bars or handrails.  First and last steps.  Where the edge of each step is.  Use tools that help you move around (mobility aids) if they are needed. These include:  Canes.  Walkers.  Scooters.  Crutches.  Turn on the lights when you go into a dark area. Replace any light bulbs as soon as they burn out.  Set up your furniture so you have a clear path. Avoid moving your furniture around.  If any of your floors are uneven, fix them.  If there are any pets around you, be aware of where they are.  Review your medicines with your doctor. Some medicines can make you feel dizzy. This can increase your chance of falling. Ask your doctor what other things that you can do to help prevent falls. This information is not intended to replace advice given to you by your health care provider. Make sure you discuss any questions you have with your health care provider. Document Released: 01/31/2009 Document Revised: 09/12/2015 Document Reviewed: 05/11/2014 Elsevier Interactive Patient Education  2017 [place].   Influenza (Flu) Vaccine (Inactivated or Recombinant): What You Need to Know 1. Why get vaccinated? Influenza ("flu") is a contagious disease that spreads around the [place] every year, usually between October and May. Flu is caused by influenza viruses, and is spread mainly by coughing, sneezing, and close contact. Anyone can get flu. Flu strikes suddenly and can last several days. Symptoms vary by age, but can include: fever/chills sore throat muscle aches fatigue cough headache runny or stuffy nose  Flu can also lead to pneumonia and blood infections, and cause diarrhea and seizures in  children. If you have a medical condition, such as heart or lung disease, flu can make it worse. Flu is more dangerous for some people. Infants and young children, people 40 years of age and older, pregnant women, and people with certain health conditions or a weakened immune system are at greatest risk. Each year thousands of people in the [place] die from flu,  and many more are hospitalized. Flu vaccine can: keep you from getting flu, make flu less severe if you do get it, and keep you from spreading flu to your family and other people. 2. Inactivated and recombinant flu vaccines A dose of flu vaccine is recommended every flu season. Children 6 months through 78 years of age may need two doses during the same flu season. Everyone else needs only one dose each flu season. Some inactivated flu vaccines contain a very small amount of a mercury-based preservative called thimerosal. Studies have not shown thimerosal in vaccines to be harmful, but flu vaccines that do not contain thimerosal are available. There is no live flu virus in flu shots. They cannot cause the flu. There are many flu viruses, and they are always changing. Each year a new flu vaccine is made to protect against three or four viruses that are likely to cause disease in the upcoming flu season. But even when the vaccine doesn't exactly match these viruses, it may still provide some protection. Flu vaccine cannot prevent: flu that is caused by a virus not covered by the vaccine, or illnesses that look like flu but are not.  It takes about 2 weeks for protection to develop after vaccination, and protection lasts through the flu season. 3. Some people should not get this vaccine Tell the person who is giving you the vaccine: If you have any severe, life-threatening allergies. If you ever had a life-threatening allergic reaction after a dose of flu vaccine, or have a severe allergy to any part of this vaccine, you may be advised  not to get vaccinated. Most, but not all, types of flu vaccine contain a small amount of egg protein. If you ever had Guillain-Barr Syndrome (also called GBS). Some people with a history of GBS should not get this vaccine. This should be discussed with your doctor. If you are not feeling well. It is usually okay to get flu vaccine when you have a mild illness, but you might be asked to come back when you feel better.  4. Risks of a vaccine reaction With any medicine, including vaccines, there is a chance of reactions. These are usually mild and go away on their own, but serious reactions are also possible. Most people who get a flu shot do not have any problems with it. Minor problems following a flu shot include: soreness, redness, or swelling where the shot was given hoarseness sore, red or itchy eyes cough fever aches headache itching fatigue  If these problems occur, they usually begin soon after the shot and last 1 or 2 days. More serious problems following a flu shot can include the following: There may be a small increased risk of Guillain-Barre Syndrome (GBS) after inactivated flu vaccine. This risk has been estimated at 1 or 2 additional cases per million people vaccinated. This is much lower than the risk of severe complications from flu, which can be prevented by flu vaccine. Young children who get the flu shot along with pneumococcal vaccine (PCV13) and/or DTaP vaccine at the same time might be slightly more likely to have a seizure caused by fever. Ask your doctor for more information. Tell your doctor if a child who is getting flu vaccine has ever had a seizure.  Problems that could happen after any injected vaccine: People sometimes faint after a medical procedure, including vaccination. Sitting or lying down for about 15 minutes can help prevent fainting, and injuries caused by a fall. Tell your doctor  if you feel dizzy, or have vision changes or ringing in the ears. Some  people get severe pain in the shoulder and have difficulty moving the arm where a shot was given. This happens very rarely. Any medication can cause a severe allergic reaction. Such reactions from a vaccine are very rare, estimated at about 1 in a million doses, and would happen within a few minutes to a few hours after the vaccination. As with any medicine, there is a very remote chance of a vaccine causing a serious injury or death. The safety of vaccines is always being monitored. For more information, visit: http://www.aguilar.org/ 5. What if there is a serious reaction? What should I look for? Look for anything that concerns you, such as signs of a severe allergic reaction, very high fever, or unusual behavior. Signs of a severe allergic reaction can include hives, swelling of the face and throat, difficulty breathing, a fast heartbeat, dizziness, and weakness. These would start a few minutes to a few hours after the vaccination. What should I do? If you think it is a severe allergic reaction or other emergency that can't wait, call 9-1-1 and get the person to the nearest hospital. Otherwise, call your doctor. Reactions should be reported to the Vaccine Adverse Event Reporting System (VAERS). Your doctor should file this report, or you can do it yourself through the VAERS web site at www.vaers.SamedayNews.es, or by calling 573-090-7749. VAERS does not give medical advice. 6. The National Vaccine Injury Compensation Program The [employer] Vaccine Injury Compensation Program (VICP) is a federal program that was created to compensate people who may have been injured by certain vaccines. Persons who believe they may have been injured by a vaccine can learn about the program and about filing a claim by calling 256-326-2382 or visiting the [place] website at GoldCloset.com.ee. There is a time limit to file a claim for compensation. 7. How can I learn more? Ask your healthcare provider. He or  she can give you the vaccine package insert or suggest other sources of information. Call your local or state health department. Contact the Centers for Disease Control and Prevention (CDC): Call 435-385-5186 (1-800-CDC-INFO) or Visit CDC's website at https://gibson.com/ Vaccine Information Statement, Inactivated Influenza Vaccine (11/24/2013) This information is not intended to replace advice given to you by your health care provider. Make sure you discuss any questions you have with your health care provider. Document Released: 01/29/2006 Document Revised: 12/26/2015 Document Reviewed: 12/26/2015 Elsevier Interactive Patient Education  2017 [employer].

## 2017-02-09 ENCOUNTER — Encounter: Payer: Self-pay | Admitting: Family Medicine

## 2017-02-09 ENCOUNTER — Ambulatory Visit (INDEPENDENT_AMBULATORY_CARE_PROVIDER_SITE_OTHER): Payer: Medicare Other | Admitting: Family Medicine

## 2017-02-09 VITALS — BP 128/69 | HR 60 | Temp 97.8°F | Resp 16 | Ht 68.0 in | Wt 179.0 lb

## 2017-02-09 DIAGNOSIS — L538 Other specified erythematous conditions: Secondary | ICD-10-CM | POA: Diagnosis not present

## 2017-02-09 DIAGNOSIS — H02846 Edema of left eye, unspecified eyelid: Secondary | ICD-10-CM

## 2017-02-09 MED ORDER — CLINDAMYCIN HCL 300 MG PO CAPS: 300.0000 mg | ORAL_CAPSULE | Freq: Three times a day (TID) | ORAL | 0 refills | Status: DC

## 2017-02-09 NOTE — Progress Notes (Signed)
Subjective:    Patient ID: Clinton Joseph, male    DOB: 08-Apr-1928, 83 y.o.   MRN: [id number]  Clinton Joseph is a 83 y.o. male presenting on 02/09/2017 for puffy eyes (onset today left eye red, swollen obtw pt was blowing leaves yesterday)  Patient presents for a same day appointment.  HPI   Left Eye Periorbital Redness and Swelling - Reports new symptoms started this morning after waking up with Left eyelid (upper and lower) swelling and redness, seems to be about same as onset this morning, not improved and not worsening. He has not tried any treatments for it yet. - Only related factor he was outside  Yesterday with a leaf blower and thinks he may have an allergic reaction or allergy, but he does not endorse normal seasonal allergies - No prior similar episodes before - He has no known eye problem in past, other than reduced vision, with corrective lens, he is followed by Dr Ellin Mayhew in [place], last checked few months ago, no significant eye concerns or diagnoses - Admits some mild blurry vision on Left eye - Denies any fever,chills, complete loss of vision, light flashes, eye pain, difficulty with eye movement, trauma facial injury fall  Health Maintenance: - UTD Flu Vaccine this season 01/12/17  Depression screen [facility] 2/9 01/12/2017 08/18/2016 10/14/2015  Decreased Interest 0 0 0  Down, Depressed, Hopeless 0 0 0  PHQ - 2 Score 0 0 0    Social History  Substance Use Topics  . Smoking status: Never Smoker  . Smokeless tobacco: Never Used  . Alcohol use 1.2 oz/week    2 Glasses of wine per week    Review of Systems Per HPI unless specifically indicated above     Objective:    BP 128/69   Pulse 60   Temp 97.8 F (36.6 C) (Oral)   Resp 16   Ht 5\' 8"  (1.727 m)   Wt 179 lb (81.2 kg)   BMI 27.22 kg/m   Wt Readings from Last 3 Encounters:  02/09/17 179 lb (81.2 kg)  01/12/17 179 lb 12.8 oz (81.6 kg)  12/29/16 180 lb (81.6 kg)    Physical Exam    Constitutional: He is oriented to person, place, and time. He appears well-developed and well-nourished. No distress.  Well-appearing, comfortable, cooperative  HENT:  Head: Normocephalic and atraumatic.  Mouth/Throat: Oropharynx is clear and moist.  Eyes: Pupils are equal, round, and reactive to light. Conjunctivae and EOM are normal. Right eye exhibits no discharge. Left eye exhibits no discharge.  No conjunctival injection. Fundoscopic exam is unremarkable, limited due to no eye dilated. Full EOM without any discomfort. See note for visual acuity  Neck: Normal range of motion. Neck supple.  Cardiovascular: Normal rate.   Pulmonary/Chest: Effort normal.  Musculoskeletal: He exhibits no edema.  Neurological: He is alert and oriented to person, place, and time.  Skin: Skin is warm and dry. No rash noted. He is not diaphoretic. There is erythema.  Mild localized upper and lower Left eyelid erythema and mild soft tissue edema, non tender, no tense induration, no ulceration, no warmth. Not affecting eye movement. No extension on face. Erythema is only minimal.  Psychiatric: His behavior is normal.  Nursing note and vitals reviewed.  Visual Acuity R eye (OD) 20/25, Left eye (OS) 20/25, and Both eyes 20/25 - with corrective lenses  Results for orders placed or performed in visit on 12/29/16  [facility] [place]  Result Value Ref  Range   Date Time Interrogation Session [id number]    Pulse Generator Manufacturer [employer]    Pulse Gen Model 2240 Assurity    Pulse Gen Serial Number [id number]    Clinic Name [facility] [facility]    Implantable Pulse Generator Type Implantable Pulse Generator    Implantable Pulse Generator Implant Date [id number]    Implantable Lead Manufacturer [place] [employer]    Implantable Lead Serial Number [id number]    Implantable Lead Implant Date [id number]    Implantable Lead Location Detail 1 UNKNOWN    Implantable Lead  Location [id number]    Implantable Lead Manufacturer [facility]    Implantable Lead Model [id number] Tendril STS    Implantable Lead Serial Number [id number]    Implantable Lead Implant Date [id number]    Implantable Lead Location Detail 1 UNKNOWN    Implantable Lead Location [id number]    Lead Channel Setting Sensing Sensitivity 4.0 mV   Lead Channel Setting Pacing Amplitude 2.0 V   Lead Channel Setting Pacing Pulse Width 0.5 ms   Lead Channel Setting Pacing Amplitude 0.75 V   Lead Channel Impedance Value 387.5 ohm   Lead Channel Sensing Intrinsic Amplitude 5.0 mV   Lead Channel Pacing Threshold Amplitude 0.5 V   Lead Channel Pacing Threshold Pulse Width 0.5 ms   Lead Channel Impedance Value 762.5 ohm   Lead Channel Sensing Intrinsic Amplitude 12.0 mV   Lead Channel Pacing Threshold Amplitude 0.5 V   Lead Channel Pacing Threshold Pulse Width 0.5 ms   Battery Status Unknown    Battery Remaining Longevity 126 mo   Battery Voltage 3.01 V   Brady Statistic RA Percent Paced 20.0 %   Brady Statistic RV Percent Paced 58.0 %   Eval Rhythm SB 54-68       Assessment & Plan:   Problem List Items Addressed This Visit    None    Visit Diagnoses    Swelling of left eyelid    -  Primary   Relevant Medications   clindamycin (CLEOCIN) 300 MG capsule   Periorbital erythema       Relevant Medications   clindamycin (CLEOCIN) 300 MG capsule  Clinically concerning for acute L upper and lower eyelid pre-septal cellulitis without evidence of concerning systemic symptoms or red flags (has intact EOMI, vision, non-tender, not extending) unlikely orbital cellulitis. - Also possible this may be allergy or allergic reaction with outdoor exposure leafblowing yesterday - No visual acuity loss  Plan: 1. Start antibiotics empirically with Clindamycin 300mg  TID x 7 days 2. Supportive care - cool/warm compresses, Tylenol PRN 3. Strongly recommended to follow-up closely with optometry - Dr Ellin Mayhew for re-evaluation  especially if not improving 4. Strict return criteria if worsening       Meds ordered this encounter  Medications  . clindamycin (CLEOCIN) 300 MG capsule    Sig: Take 1 capsule (300 mg total) by mouth 3 (three) times daily. For 7 days    Dispense:  21 capsule    Refill:  0      Follow up plan: Return in about 1 week (around 02/16/2017), or if symptoms worsen or fail to improve, for Left eye skin redness/swelling.  Nobie Putnam, [facility] 02/09/2017, 6:09 PM

## 2017-02-09 NOTE — Patient Instructions (Addendum)
Thank you for coming to the clinic today.  1. I am concerned you may have a mild skin infection of eyelid and skin near eye  - Take Clindamycin antibiotic 3 times daily or every 8 hours for next 7 days - However this may only be an allergic reaction or other local reaction, and we are being cautious today to cover you with antibiotic just in case it is actually an infection  Please go see Dr Ellin Mayhew for further eye evaluation, especially if this problem does not resolve within next 48-72 hours, and also if you continued to have blurry vision or reduced vision.  If eye starts to have some drainage, and crusty discharge, you can use a warm compress up to 2-3 times a daily, gently, avoid rubbing scratching the eye  Continue Zyrtec for allergies once daily  Please schedule a Follow-up Appointment to: Return in about 1 week (around 02/16/2017), or if symptoms worsen or fail to improve, for Left eye skin redness/swelling.  If you have any other questions or concerns, please feel free to call the clinic or send a message through [place]. You may also schedule an earlier appointment if necessary.  Additionally, you may be receiving a survey about your experience at our clinic within a few days to 1 week by e-mail or mail. We value your feedback.  Nobie Putnam, DO [place]

## 2017-02-17 NOTE — Progress Notes (Addendum)
Cardiology Office Note  Date:  02/18/2017   ID:  Clinton Joseph, DOB 1927-10-12, MRN [id number]  PCP:  Olin Hauser, DO   Chief Complaint  Patient presents with  . OTHER    12 month f/u c/o episode rapid heart beat. Meds reviewed verbally with pt.    HPI:  Clinton Joseph is a very pleasant 83 year old gentleman  CAD lymphoma 2006 with chemotherapy x7 cycles at that time. Notes indicating run of VT, sustained with presentation to the emergency room in 2012. Rate was 150 beats per minute lasting more than 3 seconds. He was given IV amiodarone and converted to normal sinus rhythm. Evaluation at that time showing nonobstructive CAD, normal ejection fraction. hypertension bradycardia, heart block in August 2015 requiring pacemaker He presents for follow-up of his arrhythmia  In follow-up today he reports having episode of dizziness 3 weeks ago Described as heavy palpitations lasting for less than 5 minutes No near syncope symptoms, only appreciated the palpitations He was in church at the time, left church early and went home, symptoms resolved on the way home after several minutes  Apart from that he denies any other episodes of tachycardia or palpitations, near syncope or lightheadedness  Pacer download results reviewed with him 800 mode switches noted, tachycardia noted for 1 hour, longest episode Report details possible atrial fibrillation  Discussed his risk factors with him CHADS VASC around 49, age, peripheral disease  Otherwise feels well with no complaints  EKG today  shows atrial paced rhythm , 60 bpm Right bundle branch block pattern with diffuse T wave abnormality precordial leads, 2 3 aVF   other past medical history Prior cardiac catheterization aug 2015 showed 40% proximal RCA disease, minimal mid left circumflex disease normal ejection fraction at that time,  Prior echocardiogram showing normal LV systolic function, otherwise normal study prior EKGs  and 2012 showed normal sinus rhythm with right bundle branch block, left anterior fascicular block   PMH:   has a past medical history of Anxiety state, unspecified; Arthritis; Diffuse large B cell lymphoma ([place]); H/O echocardiogram; Hyperlipidemia; Hypertensive heart disease; Lymphoma ([place]); Mobitz II; Non-obstructive CAD; Tremor; Urticaria, unspecified; and Ventricular tachycardia ([place]).  PSH:    Past Surgical History:  Procedure Laterality Date  . APPENDECTOMY  1948  . CARDIAC CATHETERIZATION  11/17/2010   [facility]- LAD min irregs, LCX min irregs, RCA 40p, EF 60%.   Marland Kitchen CATARACT EXTRACTION, BILATERAL Bilateral ~ 2012  . INSERT / REPLACE / REMOVE PACEMAKER  12/05/2013  . PERMANENT PACEMAKER INSERTION N/A 12/05/2013   Procedure: PERMANENT PACEMAKER INSERTION;  Surgeon: Coralyn Mark, MD;  Location: [facility] CATH LAB;  Service: Cardiovascular;  Laterality: N/A;    Current Outpatient Prescriptions  Medication Sig Dispense Refill  . aspirin 81 MG tablet Take 81 mg by mouth daily.     . cetirizine (ZYRTEC) 10 MG tablet Take 10 mg by mouth daily.    . Cholecalciferol (VITAMIN D PO) Take 2 tablets by mouth daily.    . Cyanocobalamin (B-12 PO) Take 2 tablets by mouth daily.    . fluticasone (FLONASE) 50 MCG/ACT nasal spray Place 2 sprays into both nostrils daily as needed for allergies or rhinitis. 48 g 3  . losartan (COZAAR) 50 MG tablet Take 1 tablet (50 mg total) by mouth daily. (Patient taking differently: Take 50 mg by mouth daily. Takes 1/2 tablet (25mg  )once daily.) 90 tablet 3  . Magnesium 250 MG TABS Take 250 mg by mouth daily.    Marland Kitchen  Multiple Vitamins-Minerals (MULTIVITAMIN WITH MINERALS) tablet Take 1 tablet by mouth daily.     . Omega-3 Fatty Acids (FISH OIL) 1000 MG CAPS Take 1,200 mg by mouth 2 (two) times daily.     . Skin Protectants, Misc. (EUCERIN) cream Apply 1 application topically daily as needed for dry skin.      No current facility-administered medications for this visit.       Allergies:   Keflex [cephalexin]; Penicillins; and Tape   Social History:  The patient  reports that he has never smoked. He has never used smokeless tobacco. He reports that he drinks about 1.2 oz of alcohol per week . He reports that he does not use drugs.   Family History:   family history includes Coronary artery disease in his mother; Emphysema in his father.    Review of Systems: Review of Systems  Constitutional: Negative.   Respiratory: Negative.   Cardiovascular: Positive for palpitations.  Gastrointestinal: Negative.   Musculoskeletal: Negative.   Neurological: Negative.   Psychiatric/Behavioral: Negative.   All other systems reviewed and are negative.    PHYSICAL EXAM: VS:  BP 130/68 (BP Location: Left Arm, Patient Position: Sitting, Cuff Size: Normal)   Pulse 60   Ht 5\' 8"  (1.727 m)   Wt 177 lb 8 oz (80.5 kg)   BMI 26.99 kg/m  , BMI Body mass index is 26.99 kg/m.  GEN: Well nourished, well developed, in no acute distress  HEENT: normal  Neck: no JVD, carotid bruits, or masses Cardiac: RRR; no murmurs, rubs, or gallops,no edema  Respiratory:  clear to auscultation bilaterally, normal work of breathing GI: soft, nontender, nondistended, + BS MS: no deformity or atrophy  Skin: warm and dry, no rash Neuro:  Strength and sensation are intact Psych: euthymic mood, full affect    Recent Labs: 06/12/2016: ALT 25; BUN 26; Creat 1.28; Hemoglobin 13.5; Platelets 106; Potassium 4.6; Sodium 139    Lipid Panel Lab Results  Component Value Date   CHOL 198 06/12/2016   HDL 39 (L) 06/12/2016   LDLCALC 129 (H) 06/12/2016   TRIG 151 (H) 06/12/2016      Wt Readings from Last 3 Encounters:  02/18/17 177 lb 8 oz (80.5 kg)  02/09/17 179 lb (81.2 kg)  01/12/17 179 lb 12.8 oz (81.6 kg)       ASSESSMENT AND PLAN:  Essential hypertension, benign - Plan: EKG 12-Lead Recommended he stay on his low-dose losartan Discussed whether we should restart low-dose  metoprolol given frequent mode switches, atrial tachycardia, possible atrial fibrillation  Atrial tachycardia Pacer notes indicating possible atrial fibrillation He is on aspirin Talked about restarting metoprolol which was held August 2016 by Dr. Caryl Comes He was not having side effects at that time, medication held for adequate blood pressure  Complete heart block ([place]) - Plan: EKG 12-Lead Pacer in place, followed by Dr. Caryl Comes  VT (ventricular tachycardia) [facility])  previously treated with amiodarone Recent episode of tachycardia, was symptomatic, unable to exclude sustained VT Recommend pacer download  Arteriosclerosis of coronary artery Very mild coronary disease 2 years ago by catheterization   Total encounter time more than 25 minutes  Greater than 50% was spent in counseling and coordination of care with the patient  Disposition:   F/U  12 months   No orders of the defined types were placed in this encounter.    Signed, Esmond Plants, M.D., Ph.D. 02/18/2017  [place], [place]

## 2017-02-18 ENCOUNTER — Ambulatory Visit (INDEPENDENT_AMBULATORY_CARE_PROVIDER_SITE_OTHER): Payer: Medicare Other | Admitting: Cardiovascular Disease

## 2017-02-18 ENCOUNTER — Encounter: Payer: Self-pay | Admitting: Cardiovascular Disease

## 2017-02-18 VITALS — BP 130/68 | HR 60 | Ht 68.0 in | Wt 177.5 lb

## 2017-02-18 DIAGNOSIS — I441 Atrioventricular block, second degree: Secondary | ICD-10-CM | POA: Diagnosis not present

## 2017-02-18 DIAGNOSIS — E782 Mixed hyperlipidemia: Secondary | ICD-10-CM

## 2017-02-18 DIAGNOSIS — N183 Chronic kidney disease, stage 3 unspecified: Secondary | ICD-10-CM

## 2017-02-18 DIAGNOSIS — I10 Essential (primary) hypertension: Secondary | ICD-10-CM

## 2017-02-18 DIAGNOSIS — I119 Hypertensive heart disease without heart failure: Secondary | ICD-10-CM | POA: Diagnosis not present

## 2017-02-18 DIAGNOSIS — I25118 Atherosclerotic heart disease of native coronary artery with other forms of angina pectoris: Secondary | ICD-10-CM

## 2017-02-18 DIAGNOSIS — C8590 Non-Hodgkin lymphoma, unspecified, unspecified site: Secondary | ICD-10-CM

## 2017-02-18 NOTE — Patient Instructions (Signed)

## 2017-03-09 ENCOUNTER — Ambulatory Visit: Payer: Medicare Other | Admitting: Family Medicine

## 2017-03-17 ENCOUNTER — Ambulatory Visit (INDEPENDENT_AMBULATORY_CARE_PROVIDER_SITE_OTHER): Payer: Medicare Other | Admitting: *Deleted

## 2017-03-17 DIAGNOSIS — I441 Atrioventricular block, second degree: Secondary | ICD-10-CM

## 2017-03-17 NOTE — Progress Notes (Signed)
Remote pacemaker transmission.   

## 2017-03-19 ENCOUNTER — Encounter: Payer: Self-pay | Admitting: Cardiology

## 2017-03-23 ENCOUNTER — Encounter: Payer: Self-pay | Admitting: Family Medicine

## 2017-03-23 ENCOUNTER — Other Ambulatory Visit: Payer: Self-pay | Admitting: Family Medicine

## 2017-03-23 ENCOUNTER — Ambulatory Visit (INDEPENDENT_AMBULATORY_CARE_PROVIDER_SITE_OTHER): Payer: Medicare Other | Admitting: Family Medicine

## 2017-03-23 VITALS — BP 143/59 | HR 82 | Temp 97.6°F | Resp 16 | Ht 68.0 in | Wt 176.0 lb

## 2017-03-23 DIAGNOSIS — C8590 Non-Hodgkin lymphoma, unspecified, unspecified site: Secondary | ICD-10-CM

## 2017-03-23 DIAGNOSIS — I119 Hypertensive heart disease without heart failure: Secondary | ICD-10-CM

## 2017-03-23 DIAGNOSIS — I25118 Atherosclerotic heart disease of native coronary artery with other forms of angina pectoris: Secondary | ICD-10-CM

## 2017-03-23 DIAGNOSIS — I10 Essential (primary) hypertension: Secondary | ICD-10-CM

## 2017-03-23 DIAGNOSIS — N183 Chronic kidney disease, stage 3 unspecified: Secondary | ICD-10-CM

## 2017-03-23 DIAGNOSIS — R7303 Prediabetes: Secondary | ICD-10-CM

## 2017-03-23 DIAGNOSIS — E782 Mixed hyperlipidemia: Secondary | ICD-10-CM

## 2017-03-23 DIAGNOSIS — G4762 Sleep related leg cramps: Secondary | ICD-10-CM | POA: Diagnosis not present

## 2017-03-23 DIAGNOSIS — C859A Non-Hodgkin lymphoma, unspecified, in remission: Secondary | ICD-10-CM

## 2017-03-23 LAB — POCT GLYCOSYLATED HEMOGLOBIN (HGB A1C): HEMOGLOBIN A1C: 5.8 — AB (ref ?–5.7)

## 2017-03-23 NOTE — Assessment & Plan Note (Signed)
Controlled HTN - Home BP readings none still Complication with CKD-III, CAD   Plan:  1. Continue current BP regimen - Losartan half tab 25mg  daily 2. Encourage improved lifestyle - low sodium diet, regular exercise 3. Start monitor BP outside office, bring readings to next visit, if persistently >140/90 or new symptoms notify office sooner 4. Follow-up 3 months annual + labs

## 2017-03-23 NOTE — Assessment & Plan Note (Signed)
Stable chronic problem CKD-III, likely secondary to chronic HTN, age, among other factors. Last Cr baseline 1.2 approx Not followed by Nephrology Improve hydration. Avoid NSAIDs. Continue ARB Follow-up chemistry Cr in 3 months with annual phys

## 2017-03-23 NOTE — Assessment & Plan Note (Addendum)
Stable, Y1P 5.8 Complicated by CKD-III, CAD  Plan: 1. Remain off medications 2. Encouraged improved exercise with stationary bike as planned, continue to reduce carbs in diet and sweets 3. Follow-up to monitor A1c q 3 months with labs annual

## 2017-03-23 NOTE — Progress Notes (Signed)
Subjective:    Patient ID: Clinton Joseph, male    DOB: [birth year], 83 y.o.   MRN: [id number]  Clinton Joseph is a 82 y.o. male presenting on 03/23/2017 for Hypertension and Diabetes   HPI   CHRONIC HTN with CKD-III Not checking BP at home, has cuff Current Meds - Losartan 50mg  - takes HALF tab dose 25mg  daily Reports good compliance, took meds today. Tolerating well, w/o complaints. Lifestyle: - Exercise: has a stationary bike got 2 weeks ago, rides it x 2 daily up to 3 miles per, previously going to [facility] - Diet: No changes, does not add salt, not sure on sodium food labels, wife does most of food prep Denies CP, dyspnea, HA, edema, dizziness / lightheadedness  Pre-Diabetes Reports no concerns, attributes mild inc A1c to inc sweets CBGs: none Meds: None Currently on ARB Lifestyle: - Diet (tries to adhere to DM diet, but eats sweets)  - Exercise (regular walking) Denies hypoglycemia, polyuria, visual changes, numbness or tingling.  Bilateral Leg Cramping and discomfort / Mid Back Pain Bilateral: - Reports a chronic persistent problem with several months bilateral posterior calf or leg cramping of muscle, happens only at night, not every night, feels like cramping pain, moderate to severe brief episode, improves with topical OTC muscle rub soothing cream, has not tried mustard or other meds. He thinks  - Wife in PT, he has been doing more housework and activity - Denies swelling, redness, pain in calves currently, fall injury  Health Maintenance: UTD Flu Vaccine, UTD PNA vaccine  Depression screen [facility] 2/9 03/23/2017 01/12/2017 08/18/2016  Decreased Interest 0 0 0  Down, Depressed, Hopeless 0 0 0  PHQ - 2 Score 0 0 0    Social History   Tobacco Use  . Smoking status: Never Smoker  . Smokeless tobacco: Never Used  Substance Use Topics  . Alcohol use: Yes    Alcohol/week: 1.2 oz    Types: 2 Glasses of wine per week  . Drug use: No    Review of  Systems Per HPI unless specifically indicated above     Objective:    BP (!) 143/59   Pulse 82   Temp 97.6 F (36.4 C) (Oral)   Resp 16   Ht 5\' 8"  (1.727 m)   Wt 176 lb (79.8 kg)   BMI 26.76 kg/m   Wt Readings from Last 3 Encounters:  03/23/17 176 lb (79.8 kg)  02/18/17 177 lb 8 oz (80.5 kg)  02/09/17 179 lb (81.2 kg)    Physical Exam  Constitutional: He is oriented to person, place, and time. He appears well-developed and well-nourished. No distress.  Well-appearing elderly 83 year old male comfortable, cooperative  HENT:  Head: Normocephalic and atraumatic.  Mouth/Throat: Oropharynx is clear and moist.  Eyes: Conjunctivae are normal. Right eye exhibits no discharge. Left eye exhibits no discharge.  Neck: Normal range of motion. Neck supple. No thyromegaly present.  Cardiovascular: Normal rate, regular rhythm, normal heart sounds and intact distal pulses.  No murmur heard. Pulmonary/Chest: Effort normal and breath sounds normal. No respiratory distress. He has no wheezes. He has no rales.  Musculoskeletal: Normal range of motion. He exhibits no edema.  Lymphadenopathy:    He has no cervical adenopathy.  Neurological: He is alert and oriented to person, place, and time.  Skin: Skin is warm and dry. No rash noted. He is not diaphoretic. No erythema.  Psychiatric: He has a normal mood and affect. His behavior is normal.  Well groomed, good eye contact, normal speech and thoughts  Nursing note and vitals reviewed.  Results for orders placed or performed in visit on 03/23/17  POCT HgB A1C  Result Value Ref Range   Hemoglobin A1C 5.8 (A) 5.7      Assessment & Plan:   Problem List Items Addressed This Visit    CKD (chronic kidney disease), stage III ([place])    Stable chronic problem CKD-III, likely secondary to chronic HTN, age, among other factors. Last Cr baseline 1.2 approx Not followed by Nephrology Improve hydration. Avoid NSAIDs. Continue ARB Follow-up chemistry Cr  in 3 months with annual phys      Essential hypertension    Controlled HTN - Home BP readings none still Complication with CKD-III, CAD   Plan:  1. Continue current BP regimen - Losartan half tab 25mg  daily 2. Encourage improved lifestyle - low sodium diet, regular exercise 3. Start monitor BP outside office, bring readings to next visit, if persistently >140/90 or new symptoms notify office sooner 4. Follow-up 3 months annual + labs      Hypertensive heart disease   Pre-diabetes - Primary    Stable, A1P 5.8 Complicated by CKD-III, CAD  Plan: 1. Remain off medications 2. Encouraged improved exercise with stationary bike as planned, continue to reduce carbs in diet and sweets 3. Follow-up to monitor A1c q 3 months with labs annual      Relevant Orders   POCT HgB A1C (Completed)    Other Visit Diagnoses    Nocturnal leg cramps     Suspected secondary to hydration, reduced activity, no evidence of complications Recommend improve lifestyle factors May try mustard prophylaxis daily or PRN cramp, may continue topical cream with relief, or consider OTC Hyland's Leg Cramps      No orders of the defined types were placed in this encounter.  Follow up plan: Return in about 3 months (around 06/21/2017) for Annual Physical.  Future labs ordered  Nobie Putnam, [facility] 03/23/2017, 9:52 PM

## 2017-03-23 NOTE — Patient Instructions (Addendum)
Thank you for coming to the clinic today.  1. Try to improve hydration and stay active  Leg cramps - Try spoonful of yellow mustard to relieve leg cramps or try daily to prevent the problem  - OTC natural option is Hyland's Leg Cramps (Dissolving tablet) take as needed for muscle cramps  2.  Keep up good work overall no med changes  DUE for FASTING BLOOD WORK (no food or drink after midnight before the lab appointment, only water or coffee without cream/sugar on the morning of)  SCHEDULE "Lab Only" visit in the morning at the clinic for lab draw in  3MONTHS  - Make sure Lab Only appointment is at about 1 week before your next appointment, so that results will be available  For Lab Results, once available within 2-3 days of blood draw, you can can log in to MyChart online to view your results and a brief explanation. Also, we can discuss results at next follow-up visit.  Please schedule a Follow-up Appointment to: Return in about 3 months (around 06/21/2017) for Annual Physical.  If you have any other questions or concerns, please feel free to call the clinic or send a message through [place]. You may also schedule an earlier appointment if necessary.  Additionally, you may be receiving a survey about your experience at our clinic within a few days to 1 week by e-mail or mail. We value your feedback.  Nobie Putnam, DO [place]

## 2017-03-30 LAB — CUP PACEART REMOTE DEVICE CHECK
Battery Remaining Longevity: 125 mo
Battery Remaining Percentage: 95.5 %
Brady Statistic AP VP Percent: 1 %
Brady Statistic AP VS Percent: 19 %
Brady Statistic AS VP Percent: 33 %
Brady Statistic RV Percent Paced: 33 %
Date Time Interrogation Session: 20181128084245
Implantable Lead Location: 753860
Implantable Pulse Generator Implant Date: 20150818
Lead Channel Impedance Value: 800 Ohm
Lead Channel Pacing Threshold Amplitude: 0.625 V
Lead Channel Sensing Intrinsic Amplitude: 12 mV
Lead Channel Sensing Intrinsic Amplitude: 5 mV
Lead Channel Setting Pacing Amplitude: 2 V
Lead Channel Setting Pacing Pulse Width: 0.5 ms
Lead Channel Setting Sensing Sensitivity: 4 mV
MDC IDC LEAD IMPLANT DT: 20150818
MDC IDC LEAD IMPLANT DT: 20150818
MDC IDC LEAD LOCATION: 753859
MDC IDC MSMT BATTERY VOLTAGE: 3.02 V
MDC IDC MSMT LEADCHNL RA IMPEDANCE VALUE: 390 Ohm
MDC IDC MSMT LEADCHNL RA PACING THRESHOLD AMPLITUDE: 0.5 V
MDC IDC MSMT LEADCHNL RA PACING THRESHOLD PULSEWIDTH: 0.5 ms
MDC IDC MSMT LEADCHNL RV PACING THRESHOLD PULSEWIDTH: 0.5 ms
MDC IDC SET LEADCHNL RV PACING AMPLITUDE: 0.875
MDC IDC STAT BRADY AS VS PERCENT: 48 %
MDC IDC STAT BRADY RA PERCENT PACED: 19 %
Pulse Gen Model: 2240
Pulse Gen Serial Number: 7664739

## 2017-05-25 DIAGNOSIS — M9903 Segmental and somatic dysfunction of lumbar region: Secondary | ICD-10-CM | POA: Diagnosis not present

## 2017-05-25 DIAGNOSIS — M545 Low back pain: Secondary | ICD-10-CM | POA: Diagnosis not present

## 2017-05-27 DIAGNOSIS — M545 Low back pain: Secondary | ICD-10-CM | POA: Diagnosis not present

## 2017-05-27 DIAGNOSIS — M9903 Segmental and somatic dysfunction of lumbar region: Secondary | ICD-10-CM | POA: Diagnosis not present

## 2017-06-09 ENCOUNTER — Other Ambulatory Visit: Payer: Self-pay | Admitting: Family Medicine

## 2017-06-09 DIAGNOSIS — I10 Essential (primary) hypertension: Secondary | ICD-10-CM

## 2017-06-09 MED ORDER — LOSARTAN POTASSIUM 25 MG PO TABS: 25.0000 mg | ORAL_TABLET | Freq: Every day | ORAL | 3 refills | Status: DC

## 2017-06-09 NOTE — Telephone Encounter (Signed)
Refilled Losartan rx he was on 25mg  (HALF of 50mg  pill) per Dr [name] in past. I sent new refill as requested by [facility], new lower dose 25mg  tab, do not cut in half, for same dose  Nobie Putnam, [facility] 06/09/2017, 1:05 PM

## 2017-06-16 ENCOUNTER — Ambulatory Visit (INDEPENDENT_AMBULATORY_CARE_PROVIDER_SITE_OTHER): Payer: Medicare Other | Admitting: *Deleted

## 2017-06-16 DIAGNOSIS — I441 Atrioventricular block, second degree: Secondary | ICD-10-CM

## 2017-06-16 NOTE — Progress Notes (Signed)
Remote pacemaker transmission.   

## 2017-06-17 ENCOUNTER — Encounter: Payer: Self-pay | Admitting: Cardiology

## 2017-06-17 ENCOUNTER — Other Ambulatory Visit: Payer: Medicare Other

## 2017-06-21 ENCOUNTER — Other Ambulatory Visit: Payer: Medicare Other

## 2017-06-21 DIAGNOSIS — R7303 Prediabetes: Secondary | ICD-10-CM

## 2017-06-21 DIAGNOSIS — N183 Chronic kidney disease, stage 3 unspecified: Secondary | ICD-10-CM

## 2017-06-21 DIAGNOSIS — C8590 Non-Hodgkin lymphoma, unspecified, unspecified site: Secondary | ICD-10-CM

## 2017-06-21 DIAGNOSIS — I25118 Atherosclerotic heart disease of native coronary artery with other forms of angina pectoris: Secondary | ICD-10-CM

## 2017-06-21 DIAGNOSIS — I10 Essential (primary) hypertension: Secondary | ICD-10-CM

## 2017-06-21 DIAGNOSIS — E782 Mixed hyperlipidemia: Secondary | ICD-10-CM

## 2017-06-22 ENCOUNTER — Encounter: Payer: Medicare Other | Admitting: Family Medicine

## 2017-06-23 DIAGNOSIS — E782 Mixed hyperlipidemia: Secondary | ICD-10-CM | POA: Diagnosis not present

## 2017-06-23 DIAGNOSIS — I10 Essential (primary) hypertension: Secondary | ICD-10-CM | POA: Diagnosis not present

## 2017-06-23 DIAGNOSIS — R7303 Prediabetes: Secondary | ICD-10-CM | POA: Diagnosis not present

## 2017-06-23 DIAGNOSIS — C8590 Non-Hodgkin lymphoma, unspecified, unspecified site: Secondary | ICD-10-CM | POA: Diagnosis not present

## 2017-06-23 DIAGNOSIS — I25118 Atherosclerotic heart disease of native coronary artery with other forms of angina pectoris: Secondary | ICD-10-CM | POA: Diagnosis not present

## 2017-06-24 LAB — CBC WITH DIFFERENTIAL/PLATELET
BASOS PCT: 0.8 %
Basophils Absolute: 39 cells/uL (ref 0–200)
EOS ABS: 250 {cells}/uL (ref 15–500)
Eosinophils Relative: 5.1 %
HCT: 39.1 % (ref 38.5–50.0)
Hemoglobin: 13.3 g/dL (ref 13.2–17.1)
Lymphs Abs: 1695 cells/uL (ref 850–3900)
MCH: 30.8 pg (ref 27.0–33.0)
MCHC: 34 g/dL (ref 32.0–36.0)
MCV: 90.5 fL (ref 80.0–100.0)
MONOS PCT: 6.6 %
MPV: 10.7 fL (ref 7.5–12.5)
NEUTROS PCT: 52.9 %
Neutro Abs: 2592 cells/uL (ref 1500–7800)
PLATELETS: 117 10*3/uL — AB (ref 140–400)
RBC: 4.32 10*6/uL (ref 4.20–5.80)
RDW: 12.8 % (ref 11.0–15.0)
TOTAL LYMPHOCYTE: 34.6 %
WBC mixed population: 323 cells/uL (ref 200–950)
WBC: 4.9 10*3/uL (ref 3.8–10.8)

## 2017-06-24 LAB — COMPLETE METABOLIC PANEL WITH GFR
AG Ratio: 1.8 (calc) (ref 1.0–2.5)
ALKALINE PHOSPHATASE (APISO): 46 U/L (ref 40–115)
ALT: 29 U/L (ref 9–46)
AST: 34 U/L (ref 10–35)
Albumin: 4.3 g/dL (ref 3.6–5.1)
BILIRUBIN TOTAL: 0.7 mg/dL (ref 0.2–1.2)
BUN/Creatinine Ratio: 20 (calc) (ref 6–22)
BUN: 23 mg/dL (ref 7–25)
CHLORIDE: 103 mmol/L (ref 98–110)
CO2: 32 mmol/L (ref 20–32)
Calcium: 10 mg/dL (ref 8.6–10.3)
Creat: 1.15 mg/dL — ABNORMAL HIGH (ref 0.70–1.11)
GFR, Est African American: 66 mL/min/{1.73_m2} (ref >=60)
GFR, Est Non African American: 57 mL/min/{1.73_m2} — ABNORMAL LOW (ref >=60)
GLUCOSE: 89 mg/dL (ref 65–99)
Globulin: 2.4 g/dL (calc) (ref 1.9–3.7)
Potassium: 4.3 mmol/L (ref 3.5–5.3)
Sodium: 142 mmol/L (ref 135–146)
Total Protein: 6.7 g/dL (ref 6.1–8.1)

## 2017-06-24 LAB — HEMOGLOBIN A1C
Hgb A1c MFr Bld: 5.9 % of total Hgb — ABNORMAL HIGH (ref <5.7)
MEAN PLASMA GLUCOSE: 123 (calc)
eAG (mmol/L): 6.8 (calc)

## 2017-06-24 LAB — LIPID PANEL
CHOL/HDL RATIO: 4.8 (calc) (ref <5.0)
Cholesterol: 208 mg/dL — ABNORMAL HIGH (ref <200)
HDL: 43 mg/dL (ref >40)
LDL CHOLESTEROL (CALC): 135 mg/dL — AB
NON-HDL CHOLESTEROL (CALC): 165 mg/dL — AB (ref <130)
TRIGLYCERIDES: 166 mg/dL — AB (ref <150)

## 2017-06-28 ENCOUNTER — Encounter: Payer: Self-pay | Admitting: Family Medicine

## 2017-06-28 ENCOUNTER — Ambulatory Visit (INDEPENDENT_AMBULATORY_CARE_PROVIDER_SITE_OTHER): Payer: Medicare Other | Admitting: Family Medicine

## 2017-06-28 VITALS — BP 138/60 | HR 63 | Temp 98.4°F | Resp 16 | Ht 68.0 in | Wt 177.6 lb

## 2017-06-28 DIAGNOSIS — I25118 Atherosclerotic heart disease of native coronary artery with other forms of angina pectoris: Secondary | ICD-10-CM

## 2017-06-28 DIAGNOSIS — I10 Essential (primary) hypertension: Secondary | ICD-10-CM

## 2017-06-28 DIAGNOSIS — N183 Chronic kidney disease, stage 3 unspecified: Secondary | ICD-10-CM

## 2017-06-28 DIAGNOSIS — R252 Cramp and spasm: Secondary | ICD-10-CM | POA: Diagnosis not present

## 2017-06-28 DIAGNOSIS — I442 Atrioventricular block, complete: Secondary | ICD-10-CM | POA: Diagnosis not present

## 2017-06-28 DIAGNOSIS — E782 Mixed hyperlipidemia: Secondary | ICD-10-CM

## 2017-06-28 DIAGNOSIS — Z0000 Encounter for general adult medical examination without abnormal findings: Secondary | ICD-10-CM | POA: Diagnosis not present

## 2017-06-28 DIAGNOSIS — C8590 Non-Hodgkin lymphoma, unspecified, unspecified site: Secondary | ICD-10-CM

## 2017-06-28 DIAGNOSIS — R7303 Prediabetes: Secondary | ICD-10-CM

## 2017-06-28 NOTE — Assessment & Plan Note (Signed)
Stable, asymptomatic without angina Followed by [facility] Cardiology Continue on ARB, ASA

## 2017-06-28 NOTE — Assessment & Plan Note (Signed)
Mildly uncontrolled cholesterol on omega 3, and improving lifestyle Last lipid panel 06/2017 Known CAD with inc risk, especially age, HTN  Plan: 1. Continue current meds - no statin, only omega 3 - may discuss further with Cardiology, offered Statin he declined 2. Continue ASA 81mg  for secondary ASCVD risk reduction 3. Encourage improved lifestyle - low carb/cholesterol, reduce portion size, continue improving regular exercise 4. Follow-up yearly lipids

## 2017-06-28 NOTE — Assessment & Plan Note (Signed)
Controlled HTN - Home BP readings none still Complication with CKD-III, CAD   Plan:  1. Continue current BP regimen - Losartan 25mg  daily 2. Encourage improved lifestyle - low sodium diet, regular exercise 3. Again recommend to occasionally monitor BP outside office, bring readings to next visit, if persistently >140/90 or new symptoms notify office sooner 4. Follow-up 6 months

## 2017-06-28 NOTE — Patient Instructions (Addendum)
Thank you for coming to the office today.  1.  Overall keep up the great work  Continue stationary bike  Sugar controlled A1c 5.9  Cholesterol slightly elevated, but will hold off on cholesterol medicine today - may discuss with Cardiology in future  -----------------------  Try to improve hydration and stay active  Leg cramps - Try spoonful of yellow mustard to relieve leg cramps or try daily to prevent the problem  - OTC natural option is Hyland's Leg Cramps (Dissolving tablet) take as needed for muscle cramps   Please schedule a Follow-up Appointment to: Return in about 6 months (around 12/29/2017) for Pre-DM A1c, HTN, CKD.  If you have any other questions or concerns, please feel free to call the office or send a message through [place]. You may also schedule an earlier appointment if necessary.  Additionally, you may be receiving a survey about your experience at our office within a few days to 1 week by e-mail or mail. We value your feedback.  Nobie Putnam, DO [place]

## 2017-06-28 NOTE — Assessment & Plan Note (Signed)
Stable, A1c 5.9, from 5.8 Complicated by CKD-III, CAD  Plan: 1. Remain off medications 2. Encouraged improved exercise with stationary bike as planned, continue to reduce carbs in diet and sweets 3. Follow-up to monitor A1c q 6 months now, POC

## 2017-06-28 NOTE — Assessment & Plan Note (Signed)
Stable chronic problem CKD-III, likely secondary to chronic HTN, age, among other factors. Last Cr improved to 1.15, GFR < 60 Not followed by Nephrology Improve hydration. Avoid NSAIDs. Continue ARB, low dose Follow-up q 6 months, consider repeat Cr trend if need

## 2017-06-28 NOTE — Assessment & Plan Note (Signed)
S/p pacemaker Monitored by [facility] 

## 2017-06-28 NOTE — Assessment & Plan Note (Signed)
Stable without evidence of recurrence Previously followed by [facility] 

## 2017-06-28 NOTE — Progress Notes (Signed)
Subjective:    Patient ID: Clinton Joseph, male    DOB: March 31, 1928, 84 y.o.   MRN: [id number]  Clinton Joseph is a 84 y.o. male presenting on 06/28/2017 for Annual Exam and Hypertension   HPI  CHRONIC HTN with CKD-III Not checking BP at home Current Meds - Losartan 25mg  daily (recently changed from half of 50) Reports good compliance, took meds today. Tolerating well, w/o complaints.  Pre-Diabetes Reports no concerns CBGs:none Meds:None Currently on ARB Lifestyle: - Diet (tries to adhere to DM diet, but eats sweets, has not reduced as much, now inc water)  - Exercise (regular walking, not using stationary bike as much as he would like to) Denies hypoglycemia  HYPERLIPIDEMIA / CAD - Reports no concerns. Last lipid panel 06/2017, elevated readings LDL, TC and TG - Taking Omega-3 twice daily - Never on Statin - Taking ASA 81mg  daily at PM  Bilateral Leg Cramping and discomfort / Mid Back Pain Bilateral: Last visit 03/23/17- discussed same problem, advised some lifestyle changes, inc hydration etc, see note for background, he did not recall discussing this but has improved hydration - Now still complains of occasional bilateral calf cramping posteriorly, usually at night - Has not tried mustard or OTC Hyland's leg cramp yet  PMH Lymphoma, in remission  Health Maintenance: UTD Flu Vaccine, PNA vaccine   Depression screen [facility] 2/9 06/28/2017 03/23/2017 01/12/2017  Decreased Interest 0 0 0  Down, Depressed, Hopeless 0 0 0  PHQ - 2 Score 0 0 0    Past Medical History:  Diagnosis Date  . Anxiety state, unspecified   . Arthritis    a. Hands.  . Diffuse large B cell lymphoma ([place])    a. 2007 s/p chemo - followed by Dr. Jeb Levering.  . H/O echocardiogram    a. 11/2013 Echo: EF 55-60%, mildly dil LA, mild to mod Ao sclerosis.  . Hyperlipidemia   . Hypertensive heart disease   . Lymphoma ([place])   . Mobitz II    a. 11/2013 syncope-->s/p SJM Assurity DR model  [id number] (ser # [id number]).  . Non-obstructive CAD    a. 10/2010 Cath: >M nl, LAD/LCX min irregs, RCA 40p, EF 60%.  . Tremor    a. right hand  . Urticaria, unspecified   . Ventricular tachycardia (HCC)    a. 2012->broke with amio, neg w/u.   Past Surgical History:  Procedure Laterality Date  . APPENDECTOMY  1948  . CARDIAC CATHETERIZATION  11/17/2010   [facility]- LAD min irregs, LCX min irregs, RCA 40p, EF 60%.   Marland Kitchen CATARACT EXTRACTION, BILATERAL Bilateral ~ 2012  . INSERT / REPLACE / REMOVE PACEMAKER  12/05/2013  . PERMANENT PACEMAKER INSERTION N/A 12/05/2013   Procedure: PERMANENT PACEMAKER INSERTION;  Surgeon: Coralyn Mark, MD;  Location: [facility] CATH [facility];  Service: Cardiovascular;  Laterality: N/A;   Social History   Socioeconomic History  . Marital status: Married    Spouse name: Not on file  . Number of children: Not on file  . Years of education: Not on file  . Highest education level: Not on file  Social Needs  . Financial resource strain: Not on file  . Food insecurity - worry: Not on file  . Food insecurity - inability: Not on file  . Transportation needs - medical: Not on file  . Transportation needs - non-medical: Not on file  Occupational History  . Occupation: Optometrist    Comment: retired  Tobacco Use  . Smoking status: Never Smoker  .  Smokeless tobacco: Never Used  Substance and Sexual Activity  . Alcohol use: Yes    Alcohol/week: 1.2 oz    Types: 2 Glasses of wine per week  . Drug use: No  . Sexual activity: Yes  Other Topics Concern  . Not on file  Social History Narrative  . Not on file   Family History  Problem Relation Age of Onset  . Coronary artery disease Mother   . Emphysema Father    Current Outpatient Medications on File Prior to Visit  Medication Sig  . aspirin 81 MG tablet Take 81 mg by mouth daily.   . cetirizine (ZYRTEC) 10 MG tablet Take 10 mg by mouth daily.  . Cholecalciferol (VITAMIN D PO) Take 2 tablets by mouth daily.  . Cyanocobalamin  (B-12 PO) Take 2 tablets by mouth daily.  . fluticasone (FLONASE) 50 MCG/ACT nasal spray Place 2 sprays into both nostrils daily as needed for allergies or rhinitis.  Marland Kitchen losartan (COZAAR) 25 MG tablet Take 1 tablet (25 mg total) by mouth daily. (take whole pill, do not cut in half)  . Magnesium 250 MG TABS Take 250 mg by mouth daily.  . Multiple Vitamins-Minerals (MULTIVITAMIN WITH MINERALS) tablet Take 1 tablet by mouth daily.   . Omega-3 Fatty Acids (FISH OIL) 1000 MG CAPS Take 1,200 mg by mouth 2 (two) times daily.   . Skin Protectants, Misc. (EUCERIN) cream Apply 1 application topically daily as needed for dry skin.    No current facility-administered medications on file prior to visit.     Review of Systems Per HPI unless specifically indicated above     Objective:    BP 138/60 (BP Location: Left Arm, Cuff Size: Normal)   Pulse 63   Temp 98.4 F (36.9 C) (Oral)   Resp 16   Ht 5\' 8"  (1.727 m)   Wt 177 lb 9.6 oz (80.6 kg)   BMI 27.00 kg/m   Wt Readings from Last 3 Encounters:  06/28/17 177 lb 9.6 oz (80.6 kg)  03/23/17 176 lb (79.8 kg)  02/18/17 177 lb 8 oz (80.5 kg)    Physical Exam  Constitutional: He is oriented to person, place, and time. He appears well-developed and well-nourished. No distress.  Well-appearing elderly 84 yr old male, comfortable, cooperative  HENT:  Head: Normocephalic and atraumatic.  Mouth/Throat: Oropharynx is clear and moist.  Nares patent without purulence or edema. Bilateral TMs clear without erythema, effusion or bulging. Oropharynx clear without erythema, exudates, edema or asymmetry.  Eyes: Conjunctivae and EOM are normal. Pupils are equal, round, and reactive to light. Right eye exhibits no discharge. Left eye exhibits no discharge.  Neck: Normal range of motion. Neck supple. No thyromegaly present.  Cardiovascular: Normal rate, regular rhythm, normal heart sounds and intact distal pulses.  No murmur heard. Pulmonary/Chest: Effort normal  and breath sounds normal. No respiratory distress. He has no wheezes. He has no rales.  Abdominal: Soft. Bowel sounds are normal. He exhibits no distension and no mass. There is no tenderness.  Musculoskeletal: Normal range of motion. He exhibits no edema or tenderness.  Upper / Lower Extremities: - Normal muscle tone, strength bilateral upper extremities 5/5, lower extremities 5/5  Lymphadenopathy:    He has no cervical adenopathy.  Neurological: He is alert and oriented to person, place, and time.  Distal sensation intact to light touch all extremities  Skin: Skin is warm and dry. No rash noted. He is not diaphoretic. No erythema.  Psychiatric: He has a  normal mood and affect. His behavior is normal.  Well groomed, good eye contact, normal speech and thoughts  Nursing note and vitals reviewed.  Results for orders placed or performed in visit on 06/21/17  CBC with Differential/Platelet  Result Value Ref Range   WBC 4.9 3.8 - 10.8 Thousand/uL   RBC 4.32 4.20 - 5.80 Million/uL   Hemoglobin 13.3 13.2 - 17.1 g/dL   HCT 39.1 38.5 - 50.0 %   MCV 90.5 80.0 - 100.0 fL   MCH 30.8 27.0 - 33.0 pg   MCHC 34.0 32.0 - 36.0 g/dL   RDW 12.8 11.0 - 15.0 %   Platelets 117 (L) 140 - 400 Thousand/uL   MPV 10.7 7.5 - 12.5 fL   Neutro Abs 2,592 1,500 - 7,800 cells/uL   Lymphs Abs 1,695 850 - 3,900 cells/uL   WBC mixed population 323 200 - 950 cells/uL   Eosinophils Absolute 250 15 - 500 cells/uL   Basophils Absolute 39 0 - 200 cells/uL   Neutrophils Relative % 52.9 %   Total Lymphocyte 34.6 %   Monocytes Relative 6.6 %   Eosinophils Relative 5.1 %   Basophils Relative 0.8 %  Hemoglobin A1c  Result Value Ref Range   Hgb A1c MFr Bld 5.9 (H) <5.7 % of total Hgb   Mean Plasma Glucose 123 (calc)   eAG (mmol/L) 6.8 (calc)  Lipid panel  Result Value Ref Range   Cholesterol 208 (H) <200 mg/dL   HDL 43 >40 mg/dL   Triglycerides 166 (H) <150 mg/dL   LDL Cholesterol (Calc) 135 (H) mg/dL (calc)    Total CHOL/HDL Ratio 4.8 <5.0 (calc)   Non-HDL Cholesterol (Calc) 165 (H) <130 mg/dL (calc)  COMPLETE METABOLIC PANEL WITH GFR  Result Value Ref Range   Glucose, Bld 89 65 - 99 mg/dL   BUN 23 7 - 25 mg/dL   Creat 1.15 (H) 0.70 - 1.11 mg/dL   GFR, Est Non African American 57 (L) > OR = 60 mL/min/1.53m2   GFR, Est African American 66 > OR = 60 mL/min/1.3m2   BUN/Creatinine Ratio 20 6 - 22 (calc)   Sodium 142 135 - 146 mmol/L   Potassium 4.3 3.5 - 5.3 mmol/L   Chloride 103 98 - 110 mmol/L   CO2 32 20 - 32 mmol/L   Calcium 10.0 8.6 - 10.3 mg/dL   Total Protein 6.7 6.1 - 8.1 g/dL   Albumin 4.3 3.6 - 5.1 g/dL   Globulin 2.4 1.9 - 3.7 g/dL (calc)   AG Ratio 1.8 1.0 - 2.5 (calc)   Total Bilirubin 0.7 0.2 - 1.2 mg/dL   Alkaline phosphatase (APISO) 46 40 - 115 U/L   AST 34 10 - 35 U/L   ALT 29 9 - 46 U/L      Assessment & Plan:   Problem List Items Addressed This Visit    CKD (chronic kidney disease), stage III (HCC)    Stable chronic problem CKD-III, likely secondary to chronic HTN, age, among other factors. Last Cr improved to 1.15, GFR < 60 Not followed by Nephrology Improve hydration. Avoid NSAIDs. Continue ARB, low dose Follow-up q 6 months, consider repeat Cr trend if need      Complete heart block [facility])    S/p pacemaker Monitored by [facility]      Coronary artery disease of native artery of native heart with stable angina pectoris ([place])    Stable, asymptomatic without angina Followed by [facility] Continue on ARB, ASA  Essential hypertension    Controlled HTN - Home BP readings none still Complication with CKD-III, CAD   Plan:  1. Continue current BP regimen - Losartan 25mg  daily 2. Encourage improved lifestyle - low sodium diet, regular exercise 3. Again recommend to occasionally monitor BP outside office, bring readings to next visit, if persistently >140/90 or new symptoms notify office sooner 4. Follow-up 6 months      Hyperlipidemia     Mildly uncontrolled cholesterol on omega 3, and improving lifestyle Last lipid panel 06/2017 Known CAD with inc risk, especially age, HTN  Plan: 1. Continue current meds - no statin, only omega 3 - may discuss further with Cardiology, offered Statin he declined 2. Continue ASA 81mg  for secondary ASCVD risk reduction 3. Encourage improved lifestyle - low carb/cholesterol, reduce portion size, continue improving regular exercise 4. Follow-up yearly lipids      Lymphoma in remission ([place])    Stable without evidence of recurrence Previously followed by [facility]      Pre-diabetes    Stable, A1c 5.9, from 5.8 Complicated by CKD-III, CAD  Plan: 1. Remain off medications 2. Encouraged improved exercise with stationary bike as planned, continue to reduce carbs in diet and sweets 3. Follow-up to monitor A1c q 6 months now, POC       Other Visit Diagnoses    Annual physical exam    -  Primary UTD Health maintenance Reviewed labs Encourage continue healthy lifestyle, inc water, diet, start more regular exercise    Leg cramping     Mild, improved now with hydration Continue improve water intake Trial on mustard remedy and can use OTC Hyland's Leg Cramps PRN Improve regular exercise stationary bike      No orders of the defined types were placed in this encounter.   Follow up plan: Return in about 6 months (around 12/29/2017) for Pre-DM A1c, HTN, CKD.  Nobie Putnam, [facility] 06/28/2017, 10:06 PM

## 2017-07-02 LAB — CUP PACEART REMOTE DEVICE CHECK
Battery Voltage: 3.01 V
Brady Statistic AP VP Percent: 1 %
Brady Statistic RA Percent Paced: 19 %
Brady Statistic RV Percent Paced: 31 %
Implantable Lead Implant Date: 20150818
Implantable Lead Model: 1948
Implantable Pulse Generator Implant Date: 20150818
Lead Channel Impedance Value: 800 Ohm
Lead Channel Pacing Threshold Amplitude: 0.5 V
Lead Channel Pacing Threshold Pulse Width: 0.5 ms
Lead Channel Pacing Threshold Pulse Width: 0.5 ms
Lead Channel Setting Pacing Amplitude: 0.875
Lead Channel Setting Sensing Sensitivity: 4 mV
MDC IDC LEAD IMPLANT DT: 20150818
MDC IDC LEAD LOCATION: 753859
MDC IDC LEAD LOCATION: 753860
MDC IDC MSMT BATTERY REMAINING LONGEVITY: 125 mo
MDC IDC MSMT BATTERY REMAINING PERCENTAGE: 95.5 %
MDC IDC MSMT LEADCHNL RA IMPEDANCE VALUE: 390 Ohm
MDC IDC MSMT LEADCHNL RA SENSING INTR AMPL: 5 mV
MDC IDC MSMT LEADCHNL RV PACING THRESHOLD AMPLITUDE: 0.625 V
MDC IDC MSMT LEADCHNL RV SENSING INTR AMPL: 12 mV
MDC IDC SESS DTM: 20190227070012
MDC IDC SET LEADCHNL RA PACING AMPLITUDE: 2 V
MDC IDC SET LEADCHNL RV PACING PULSEWIDTH: 0.5 ms
MDC IDC STAT BRADY AP VS PERCENT: 19 %
MDC IDC STAT BRADY AS VP PERCENT: 31 %
MDC IDC STAT BRADY AS VS PERCENT: 49 %
Pulse Gen Model: 2240
Pulse Gen Serial Number: 7664739

## 2017-07-30 ENCOUNTER — Telehealth: Payer: Self-pay | Admitting: Cardiovascular Disease

## 2017-07-30 NOTE — Telephone Encounter (Signed)
[facility] sent fax in for refill of metoprolol. Called and made them aware that this medication was discontinued. She stated that she would update their records. No other questions at this time.

## 2017-08-30 ENCOUNTER — Ambulatory Visit (INDEPENDENT_AMBULATORY_CARE_PROVIDER_SITE_OTHER): Payer: Medicare Other | Admitting: Family Medicine

## 2017-08-30 ENCOUNTER — Ambulatory Visit
Admission: RE | Admit: 2017-08-30 | Discharge: 2017-08-30 | Disposition: A | Discharge status: Home / Self Care | Payer: Medicare Other | Source: Ambulatory Visit | Attending: Family Medicine | Admitting: Family Medicine

## 2017-08-30 ENCOUNTER — Encounter: Payer: Self-pay | Admitting: Family Medicine

## 2017-08-30 VITALS — BP 130/72 | HR 101 | Temp 98.0°F | Resp 16 | Ht 68.0 in | Wt 176.0 lb

## 2017-08-30 DIAGNOSIS — M25461 Effusion, right knee: Secondary | ICD-10-CM | POA: Diagnosis not present

## 2017-08-30 DIAGNOSIS — M11261 Other chondrocalcinosis, right knee: Secondary | ICD-10-CM | POA: Diagnosis not present

## 2017-08-30 DIAGNOSIS — I7090 Unspecified atherosclerosis: Secondary | ICD-10-CM | POA: Insufficient documentation

## 2017-08-30 DIAGNOSIS — M25561 Pain in right knee: Secondary | ICD-10-CM

## 2017-08-30 DIAGNOSIS — M1711 Unilateral primary osteoarthritis, right knee: Secondary | ICD-10-CM | POA: Diagnosis not present

## 2017-08-30 MED ORDER — DICLOFENAC SODIUM 1 % TD GEL: 2.0000 g | Freq: Three times a day (TID) | TRANSDERMAL | 2 refills | Status: DC | PRN

## 2017-08-30 NOTE — Patient Instructions (Addendum)
Thank you for coming to the office today.  You most likely have some pain and swelling of Right knee due to arthritis wear and tear  I do not think you have a torn meniscus or ligament  - X-rays  Will call once results likely within 24 hours  We cannot take anti inflammatory meds - NOT SAFE FOR KIDNEYS DO NOT TAKE any ibuprofen, aleve, motrin  New rx for Diclofenac topical gel for anti inflammatory TOPICAL only - on knee 2 to 3 times daily as needed for 1-2 weeks then as needed  - It is safe to take Tylenol Ext Str 500mg  tabs - take 1 to 2 (max dose 1000mg ) every 6 hours as needed for breakthrough pain, max 24 hour daily dose is 6 to 8 tablets or 4000mg   Use RICE therapy: - R - Rest / relative rest with activity modification avoid overuse and frequent bending or pressure on bent knee - I - Ice packs (make sure you use a towel or sock / something to protect skin) - C - Compression with flexible Knee Sleeve to apply pressure and reduce swelling allowing more support - E - Elevation - if significant swelling, lift leg above heart level (toes above your nose) to help reduce swelling, most helpful at night after day of being on your feet  I do recommend knee joint fluid drain and steroid injection - however we will try the other route first with topical cream and x-ray and rest  If not improving call and schedule to return within few days to 1 week and we can try here to drain fluid and do knee injection steroid medicine,  OR you can let me know and I will refer you to Orthopedic location  JUST IN CASE _ we may refer you to this location  [facility] ([facility] Address: [address] [place],  [address] Hours:  9AM-5PM Phone: [phone]  Or we can send you to [facility]  Please schedule a Follow-up Appointment to: Return in about 1 week (around 09/06/2017), or if symptoms worsen or fail to improve, for Right knee pain.  If you have any other  questions or concerns, please feel free to call the office or send a message through [place]. You may also schedule an earlier appointment if necessary.  Additionally, you may be receiving a survey about your experience at our office within a few days to 1 week by e-mail or mail. We value your feedback.  Nobie Putnam, DO [place]

## 2017-08-30 NOTE — Progress Notes (Signed)
Subjective:    Patient ID: Clinton Joseph, male    DOB: 09-17-1927, 84 y.o.   MRN: [id number]  Clinton Joseph is a 84 y.o. male presenting on 08/30/2017 for Knee Pain (right side)  Patient presents for a same day appointment.  HPI   Right Knee Pain - Previously used to have prior episode of Knee pain, he thinks he had fluid on it and it required aspiration or drainage, but he does not remember because this was many years ago - Reports current episode R knee pain first started hurting about 3 weeks ago without injury, described gradual worsening pain in R knee, seemed to bother him mostly only at night and would keep him awake at night, he tried alternating ice and heat with some relief - He has CKD-III, and has tried to avoid NSAIDs. He can tolerate Tylenol PM to help sleep sometimes. - Denies any injury, fall, twist or trauma, redness, fever chills nausea vomiting other joint pain   Depression screen [facility] 2/9 06/28/2017 03/23/2017 01/12/2017  Decreased Interest 0 0 0  Down, Depressed, Hopeless 0 0 0  PHQ - 2 Score 0 0 0    Social History   Tobacco Use  . Smoking status: Never Smoker  . Smokeless tobacco: Never Used  Substance Use Topics  . Alcohol use: Yes    Alcohol/week: 1.2 oz    Types: 2 Glasses of wine per week  . Drug use: No    Review of Systems Per HPI unless specifically indicated above     Objective:    BP 130/72   Pulse (!) 101   Temp 98 F (36.7 C)   Resp 16   Ht 5\' 8"  (1.727 m)   Wt 176 lb (79.8 kg)   BMI 26.76 kg/m   Wt Readings from Last 3 Encounters:  08/30/17 176 lb (79.8 kg)  06/28/17 177 lb 9.6 oz (80.6 kg)  03/23/17 176 lb (79.8 kg)    Physical Exam  Constitutional: He is oriented to person, place, and time. He appears well-developed and well-nourished. No distress.  Well-appearing eldery 84 yr old male, comfortable, cooperative  HENT:  Head: Normocephalic and atraumatic.  Mouth/Throat: Oropharynx is clear and moist.  Eyes:  Conjunctivae are normal. Right eye exhibits no discharge. Left eye exhibits no discharge.  Cardiovascular: Normal rate.  Pulmonary/Chest: Effort normal.  Musculoskeletal:  Right Knee Inspection: Mild effusion or bulky appearance R compard to L. No ecchymosis Palpation: Mild +TTP R knee only lateral joint line and posteriorly. Fine crepitus ROM: Slightly reduced active ROM R knee full flex/ext Special Testing: Lachman / Valgus/Varus tests negative with intact ligaments (ACL, MCL, LCL). Standing Theslay test negative for meniscal pain Strength: 5/5 intact knee flex/ext, ankle dorsi/plantarflex Neurovascular: distally intact sensation light touch and pulses  Neurological: He is alert and oriented to person, place, and time.  Skin: Skin is warm and dry. No rash noted. He is not diaphoretic. No erythema.  Psychiatric: He has a normal mood and affect. His behavior is normal.  Well groomed, good eye contact, normal speech and thoughts  Nursing note and vitals reviewed.  Results for orders placed or performed in visit on 06/21/17  CBC with Differential/Platelet  Result Value Ref Range   WBC 4.9 3.8 - 10.8 Thousand/uL   RBC 4.32 4.20 - 5.80 Million/uL   Hemoglobin 13.3 13.2 - 17.1 g/dL   HCT 39.1 38.5 - 50.0 %   MCV 90.5 80.0 - 100.0 fL   MCH 30.8  27.0 - 33.0 pg   MCHC 34.0 32.0 - 36.0 g/dL   RDW 12.8 11.0 - 15.0 %   Platelets 117 (L) 140 - 400 Thousand/uL   MPV 10.7 7.5 - 12.5 fL   Neutro Abs 2,592 1,500 - 7,800 cells/uL   Lymphs Abs 1,695 850 - 3,900 cells/uL   WBC mixed population 323 200 - 950 cells/uL   Eosinophils Absolute 250 15 - 500 cells/uL   Basophils Absolute 39 0 - 200 cells/uL   Neutrophils Relative % 52.9 %   Total Lymphocyte 34.6 %   Monocytes Relative 6.6 %   Eosinophils Relative 5.1 %   Basophils Relative 0.8 %  Hemoglobin A1c  Result Value Ref Range   Hgb A1c MFr Bld 5.9 (H) <5.7 % of total Hgb   Mean Plasma Glucose 123 (calc)   eAG (mmol/L) 6.8 (calc)  Lipid  panel  Result Value Ref Range   Cholesterol 208 (H) <200 mg/dL   HDL 43 >40 mg/dL   Triglycerides 166 (H) <150 mg/dL   LDL Cholesterol (Calc) 135 (H) mg/dL (calc)   Total CHOL/HDL Ratio 4.8 <5.0 (calc)   Non-HDL Cholesterol (Calc) 165 (H) <130 mg/dL (calc)  COMPLETE METABOLIC PANEL WITH GFR  Result Value Ref Range   Glucose, Bld 89 65 - 99 mg/dL   BUN 23 7 - 25 mg/dL   Creat 1.15 (H) 0.70 - 1.11 mg/dL   GFR, Est Non African American 57 (L) > OR = 60 mL/min/1.36m2   GFR, Est African American 66 > OR = 60 mL/min/1.60m2   BUN/Creatinine Ratio 20 6 - 22 (calc)   Sodium 142 135 - 146 mmol/L   Potassium 4.3 3.5 - 5.3 mmol/L   Chloride 103 98 - 110 mmol/L   CO2 32 20 - 32 mmol/L   Calcium 10.0 8.6 - 10.3 mg/dL   Total Protein 6.7 6.1 - 8.1 g/dL   Albumin 4.3 3.6 - 5.1 g/dL   Globulin 2.4 1.9 - 3.7 g/dL (calc)   AG Ratio 1.8 1.0 - 2.5 (calc)   Total Bilirubin 0.7 0.2 - 1.2 mg/dL   Alkaline phosphatase (APISO) 46 40 - 115 U/L   AST 34 10 - 35 U/L   ALT 29 9 - 46 U/L      Assessment & Plan:   Problem List Items Addressed This Visit    None    Visit Diagnoses    Acute pain of right knee    -  Primary   Relevant Medications   diclofenac sodium (VOLTAREN) 1 % GEL   Other Relevant Orders   DG Knee Complete 4 Views Right   Effusion of bursa of right knee       Relevant Medications   diclofenac sodium (VOLTAREN) 1 % GEL   Other Relevant Orders   DG Knee Complete 4 Views Right      Acute now more subacute R lateral Knee pain (and posterior) with and swelling without known injury or trauma  Suspected knee OA/DJD without prior imaging - Able to bear weight, no knee instability, mechanical locking - No prior history of knee surgery, arthroscopy - Inadequate conservative therapy   Plan: 1. Unable to tolerate NSAID d/t CKD-III - will send antiinflammatory topical rx Diclofenac 100mg , use topically up to 2-3 times daily (failed Ibuprofen, Advil, Aleve, Naproxen) - CHECK X-Ray R knee  today, pending - Start Tylenol 500-1000mg  per dose TID regular dosing - If not improve may start muscle relaxant with Baclofen  3. RICE therapy (  rest, ice, compression, elevation) for swelling, activity modification 4. Follow-up 2-6 weeks, if still worsening, consider steroid injection + aspiration of swelling and referral to [facility] for further eval   Meds ordered this encounter  Medications  . diclofenac sodium (VOLTAREN) 1 % GEL    Sig: Apply 2 g topically 3 (three) times daily as needed (For knee pain and swelling).    Dispense:  100 g    Refill:  2    Follow up plan: Return in about 1 week (around 09/06/2017), or if symptoms worsen or fail to improve, for Right knee pain.   Nobie Putnam, [facility] 08/31/2017, 1:34 AM

## 2017-09-07 DIAGNOSIS — M25561 Pain in right knee: Secondary | ICD-10-CM | POA: Diagnosis not present

## 2017-09-15 ENCOUNTER — Ambulatory Visit (INDEPENDENT_AMBULATORY_CARE_PROVIDER_SITE_OTHER): Payer: Medicare Other | Admitting: *Deleted

## 2017-09-15 DIAGNOSIS — I441 Atrioventricular block, second degree: Secondary | ICD-10-CM | POA: Diagnosis not present

## 2017-09-15 NOTE — Progress Notes (Signed)
Remote pacemaker transmission.   

## 2017-10-05 LAB — CUP PACEART REMOTE DEVICE CHECK
Brady Statistic AP VP Percent: 1 %
Brady Statistic AP VS Percent: 17 %
Brady Statistic AS VP Percent: 39 %
Brady Statistic AS VS Percent: 43 %
Brady Statistic RV Percent Paced: 40 %
Date Time Interrogation Session: 20190529060518
Implantable Lead Implant Date: 20150818
Implantable Lead Location: 753859
Implantable Lead Location: 753860
Implantable Lead Model: 1948
Lead Channel Impedance Value: 840 Ohm
Lead Channel Pacing Threshold Amplitude: 0.625 V
Lead Channel Pacing Threshold Pulse Width: 0.5 ms
Lead Channel Sensing Intrinsic Amplitude: 12 mV
Lead Channel Setting Pacing Amplitude: 0.875
Lead Channel Setting Pacing Amplitude: 2 V
Lead Channel Setting Pacing Pulse Width: 0.5 ms
Lead Channel Setting Sensing Sensitivity: 4 mV
MDC IDC LEAD IMPLANT DT: 20150818
MDC IDC MSMT BATTERY REMAINING LONGEVITY: 125 mo
MDC IDC MSMT BATTERY REMAINING PERCENTAGE: 95.5 %
MDC IDC MSMT BATTERY VOLTAGE: 3.01 V
MDC IDC MSMT LEADCHNL RA IMPEDANCE VALUE: 390 Ohm
MDC IDC MSMT LEADCHNL RA PACING THRESHOLD AMPLITUDE: 0.5 V
MDC IDC MSMT LEADCHNL RA PACING THRESHOLD PULSEWIDTH: 0.5 ms
MDC IDC MSMT LEADCHNL RA SENSING INTR AMPL: 5 mV
MDC IDC PG IMPLANT DT: 20150818
MDC IDC STAT BRADY RA PERCENT PACED: 17 %
Pulse Gen Model: 2240
Pulse Gen Serial Number: 7664739

## 2017-12-15 ENCOUNTER — Ambulatory Visit (INDEPENDENT_AMBULATORY_CARE_PROVIDER_SITE_OTHER): Payer: Medicare Other | Admitting: *Deleted

## 2017-12-15 DIAGNOSIS — I441 Atrioventricular block, second degree: Secondary | ICD-10-CM

## 2017-12-15 NOTE — Progress Notes (Signed)
Remote pacemaker transmission.   

## 2017-12-21 ENCOUNTER — Telehealth: Payer: Self-pay | Admitting: Family Medicine

## 2017-12-21 NOTE — Telephone Encounter (Signed)
Called to schedule Medicare Annual Wellness Visit with the Nurse Health Advisor.   If patient returns call, please note: their last AWV was on 9/25 /18 please schedule AWV with NHA any date after 9/25/ 2019  Thank you! For any questions please contact: Jill Alexanders [phone] or Skype at: [email or web address]

## 2017-12-30 ENCOUNTER — Ambulatory Visit: Payer: Medicare Other | Admitting: Family Medicine

## 2018-01-04 ENCOUNTER — Encounter: Payer: Self-pay | Admitting: Internal Medicine

## 2018-01-04 ENCOUNTER — Encounter: Payer: Self-pay | Admitting: Emergency Medicine

## 2018-01-04 ENCOUNTER — Other Ambulatory Visit: Payer: Self-pay

## 2018-01-04 ENCOUNTER — Emergency Department
Admission: EM | Admit: 2018-01-04 | Discharge: 2018-01-04 | Disposition: A | Discharge status: Home / Self Care | Payer: Medicare Other | Attending: Emergency Medicine | Admitting: Emergency Medicine

## 2018-01-04 ENCOUNTER — Ambulatory Visit (INDEPENDENT_AMBULATORY_CARE_PROVIDER_SITE_OTHER): Payer: Medicare Other | Admitting: Internal Medicine

## 2018-01-04 VITALS — BP 148/84 | HR 78 | Ht 67.5 in | Wt 175.5 lb

## 2018-01-04 DIAGNOSIS — I442 Atrioventricular block, complete: Secondary | ICD-10-CM

## 2018-01-04 DIAGNOSIS — I471 Supraventricular tachycardia: Secondary | ICD-10-CM

## 2018-01-04 DIAGNOSIS — W540XXA Bitten by dog, initial encounter: Secondary | ICD-10-CM | POA: Insufficient documentation

## 2018-01-04 DIAGNOSIS — I129 Hypertensive chronic kidney disease with stage 1 through stage 4 chronic kidney disease, or unspecified chronic kidney disease: Secondary | ICD-10-CM | POA: Diagnosis not present

## 2018-01-04 DIAGNOSIS — Z79899 Other long term (current) drug therapy: Secondary | ICD-10-CM | POA: Insufficient documentation

## 2018-01-04 DIAGNOSIS — N183 Chronic kidney disease, stage 3 (moderate): Secondary | ICD-10-CM | POA: Insufficient documentation

## 2018-01-04 DIAGNOSIS — Z23 Encounter for immunization: Secondary | ICD-10-CM | POA: Diagnosis not present

## 2018-01-04 DIAGNOSIS — S61452A Open bite of left hand, initial encounter: Secondary | ICD-10-CM | POA: Diagnosis not present

## 2018-01-04 DIAGNOSIS — Z950 Presence of cardiac pacemaker: Secondary | ICD-10-CM | POA: Insufficient documentation

## 2018-01-04 DIAGNOSIS — Z7982 Long term (current) use of aspirin: Secondary | ICD-10-CM | POA: Diagnosis not present

## 2018-01-04 DIAGNOSIS — R600 Localized edema: Secondary | ICD-10-CM | POA: Insufficient documentation

## 2018-01-04 DIAGNOSIS — I441 Atrioventricular block, second degree: Secondary | ICD-10-CM | POA: Diagnosis not present

## 2018-01-04 DIAGNOSIS — L089 Local infection of the skin and subcutaneous tissue, unspecified: Secondary | ICD-10-CM

## 2018-01-04 MED ORDER — SULFAMETHOXAZOLE-TRIMETHOPRIM 800-160 MG PO TABS: 1.0000 | ORAL_TABLET | Freq: Two times a day (BID) | ORAL | 0 refills | Status: DC

## 2018-01-04 MED ORDER — TETANUS-DIPHTH-ACELL PERTUSSIS 5-2.5-18.5 LF-MCG/0.5 IM SUSP
0.5000 mL | Freq: Once | INTRAMUSCULAR | Status: AC
  Administered 2018-01-04: 0.5 mL via INTRAMUSCULAR
  Filled 2018-01-04: qty 0.5

## 2018-01-04 MED ORDER — METRONIDAZOLE 500 MG PO TABS: 500.0000 mg | ORAL_TABLET | Freq: Two times a day (BID) | ORAL | 0 refills | Status: DC

## 2018-01-04 NOTE — ED Notes (Signed)
Left hand cleansed with surgical scrub  bandaid applied

## 2018-01-04 NOTE — Patient Instructions (Signed)
Medication Instructions: - Your physician recommends that you continue on your current medications as directed. Please refer to the Current Medication list given to you today.  Labwork: - none ordered  Procedures/Testing: - none ordered  Follow-Up: - Remote monitoring is used to monitor your Pacemaker of ICD from home. This monitoring reduces the number of office visits required to check your device to one time per year. It allows [place] to keep an eye on the functioning of your device to ensure it is working properly. You are scheduled for a device check from home on 03/16/18. You may send your transmission at any time that day. If you have a wireless device, the transmission will be sent automatically. After your physician reviews your transmission, you will receive a postcard with your next transmission date.  - Your physician wants you to follow-up in: 1 year with Dr. Caryl Comes. You will receive a reminder letter/call in the mail two months in advance. If you don't receive a letter/ call, please call our office to schedule the follow-up appointment.   Any Additional Special Instructions Will Be Listed Below (If Applicable).  - please report to the ER for evaluation of the wound on your hand   If you need a refill on your cardiac medications before your next appointment, please call your pharmacy.

## 2018-01-04 NOTE — Progress Notes (Signed)
Patient Care Team: Olin Hauser, DO as PCP - General (Family Medicine) Minna Merritts, MD as Consulting Physician (Cardiology)   HPI  Clinton Joseph is a 84 y.o. male Seen in follow-up for pacemaker implanted 8/15 by Dr. Greggory Brandy for syncope and Mobitz 2 heart block and intermittent complete heart block and pauses of greater than 6 seconds.  He has mildly obstructive coronary disease by cath 2012. Echocardiogram 8/15 demonstrated EF of 55-60% with aortic sclerosis without stenosis.  The patient denies chest pain, shortness of breath, nocturnal dyspnea, orthopnea or peripheral edema.  There have been no palpitations, lightheadedness or syncope.     Past Medical History:  Diagnosis Date  . Anxiety state, unspecified   . Arthritis    a. Hands.  . Diffuse large B cell lymphoma ([place])    a. 2007 s/p chemo - followed by Dr. Jeb Levering.  . H/O echocardiogram    a. 11/2013 Echo: EF 55-60%, mildly dil LA, mild to mod Ao sclerosis.  . Hyperlipidemia   . Hypertensive heart disease   . Lymphoma ([place])   . Mobitz II    a. 11/2013 syncope-->s/p SJM Assurity DR model [id number] (ser # [id number]).  . Non-obstructive CAD    a. 10/2010 Cath: >M nl, LAD/LCX min irregs, RCA 40p, EF 60%.  . Tremor    a. right hand  . Urticaria, unspecified   . Ventricular tachycardia (HCC)    a. 2012->broke with amio, neg w/u.    Past Surgical History:  Procedure Laterality Date  . APPENDECTOMY  1948  . CARDIAC CATHETERIZATION  11/17/2010   [facility]- LAD min irregs, LCX min irregs, RCA 40p, EF 60%.   Marland Kitchen CATARACT EXTRACTION, BILATERAL Bilateral ~ 2012  . INSERT / REPLACE / REMOVE PACEMAKER  12/05/2013  . PERMANENT PACEMAKER INSERTION N/A 12/05/2013   Procedure: PERMANENT PACEMAKER INSERTION;  Surgeon: Coralyn Mark, MD;  Location: [facility] CATH [facility];  Service: Cardiovascular;  Laterality: N/A;    Current Outpatient Medications  Medication Sig Dispense Refill  . aspirin 81 MG tablet Take 81 mg by mouth  daily.     . cetirizine (ZYRTEC) 10 MG tablet Take 10 mg by mouth daily.    . fluticasone (FLONASE) 50 MCG/ACT nasal spray Place 2 sprays into both nostrils daily as needed for allergies or rhinitis. 48 g 3  . losartan (COZAAR) 25 MG tablet Take 1 tablet (25 mg total) by mouth daily. (take whole pill, do not cut in half) 90 tablet 3  . Multiple Vitamins-Minerals (MULTIVITAMIN WITH MINERALS) tablet Take 1 tablet by mouth daily.     . Omega-3 Fatty Acids (FISH OIL) 1000 MG CAPS Take 1,200 mg by mouth 2 (two) times daily.      No current facility-administered medications for this visit.     Allergies  Allergen Reactions  . Keflex [Cephalexin]     Diarrhea   . Penicillins Hives and Rash  . Tape Rash    Review of Systems negative except from HPI and PMH  Physical Exam BP (!) 148/84 (BP Location: Left Arm, Patient Position: Sitting, Cuff Size: Normal)   Pulse 78   Ht 5' 7.5" (1.715 m)   Wt 175 lb 8 oz (79.6 kg)   BMI 27.08 kg/m  Well developed and nourished in no acute distress HENT normal Neck supple with JVP-flat Clear Device pocket well healed; without hematoma or erythema.  There is no tethering  Regular rate and rhythm, no murmurs or gallops Abd-soft with  active BS No Clubbing cyanosis edema Skin-warm and dry  L hand swollen and erythematous A & Oriented  Grossly normal sensory and motor function *  ECG sinus with 1AVB and intermittent 2 AVB 1 wth V pacing  Sinus @  Assessment and  Plan  Complete heart block now with some intrinsic conduction with prolonged PR interval and bifascicular block  Pacemaker-[employer] The patient's device was interrogated.  The information was reviewed. No changes were made in the programming.     Syncope     SCAF  Hypertension  BP at home 130s  Atrial tachycardia   Dog Bite   Will maintain intrinsic conduction by programming VIP  No syncope  BP well controlled at home  Spoke with ER and reading uptodate; They have recommended  getting seen sooner rather than later Will have them go  Pen allergic so doxy 100 bid would be appropriate   SCAF < 2 hrs, will follow without anticoagulation

## 2018-01-04 NOTE — ED Notes (Signed)
See triage note  Presents with dog bite to left hand  states he was trying to help his dog up the steps and was bitten  Left hand slightly swollen  2 small puncture/lacerations noted to hand    This happened last Saturday

## 2018-01-04 NOTE — ED Triage Notes (Signed)
Pt to ED sent from Dr. Jens Som office to look at dog bite wound to LFT hand that happened last week. Pt denies pain or fever. NAD noted. VSS

## 2018-01-04 NOTE — Discharge Instructions (Signed)
Follow discharge care instruction take medication as directed. °

## 2018-01-04 NOTE — ED Provider Notes (Signed)
[facility] Emergency Department Provider Note   ____________________________________________   First MD Initiated Contact with Patient 01/04/18 1252     (approximate)   I have reviewed the triage vital signs and the nursing notes.   HISTORY  Chief Complaint No chief complaint on file.    HPI Clinton Joseph is a 84 y.o. male patient presents with dog bite left hand which occurred 3 days ago.  Patient was seen at his family clinic and was told to come to emergency room for treatment.  Patient denies loss of sensation or loss of function of the left hand.  Dog bite was from a family pet.  Dog immunization are up-to-date.  Patient unsure of his last tetanus shot.  Patient is right-hand dominant.  Patient denies pain.   Past Medical History:  Diagnosis Date  . Anxiety state, unspecified   . Arthritis    a. Hands.  . Diffuse large B cell lymphoma ([place])    a. 2007 s/p chemo - followed by Dr. Jeb Levering.  . H/O echocardiogram    a. 11/2013 Echo: EF 55-60%, mildly dil LA, mild to mod Ao sclerosis.  . Hyperlipidemia   . Hypertensive heart disease   . Lymphoma ([place])   . Mobitz II    a. 11/2013 syncope-->s/p SJM Assurity DR model [id number] (ser # [id number]).  . Non-obstructive CAD    a. 10/2010 Cath: >M nl, LAD/LCX min irregs, RCA 40p, EF 60%.  . Tremor    a. right hand  . Urticaria, unspecified   . Ventricular tachycardia (HCC)    a. 2012->broke with amio, neg w/u.    Patient Active Problem List   Diagnosis Date Noted  . Complete heart block ([place]) 06/28/2017  . Hyperlipidemia 08/18/2016  . CKD (chronic kidney disease), stage III ([place]) 08/18/2016  . Allergic rhinitis due to allergen 04/30/2016  . Pre-diabetes 10/14/2015  . Hypertensive heart disease   . Mobitz II   . Pacemaker 10/16/2014  . Tachycardia 10/16/2014  . Second degree Mobitz II AV block 12/05/2013  . Syncope 12/04/2013  . History of ventricular tachycardia 08/30/2013  . Abnormal EKG  08/30/2013  . Dizziness 08/30/2013  . Essential hypertension 08/30/2013  . Anxiety 08/30/2013  . Lymphoma in remission ([place]) 08/30/2013  . Coronary artery disease of native artery of native heart with stable angina pectoris ([place]) 02/24/2011  . Bundle branch block, right and left anterior fascicular 02/24/2011    Past Surgical History:  Procedure Laterality Date  . APPENDECTOMY  1948  . CARDIAC CATHETERIZATION  11/17/2010   [facility]- LAD min irregs, LCX min irregs, RCA 40p, EF 60%.   Marland Kitchen CATARACT EXTRACTION, BILATERAL Bilateral ~ 2012  . INSERT / REPLACE / REMOVE PACEMAKER  12/05/2013  . PERMANENT PACEMAKER INSERTION N/A 12/05/2013   Procedure: PERMANENT PACEMAKER INSERTION;  Surgeon: Coralyn Mark, MD;  Location: [facility] CATH LAB;  Service: Cardiovascular;  Laterality: N/A;    Prior to Admission medications   Medication Sig Start Date End Date Taking? Authorizing Provider  aspirin 81 MG tablet Take 81 mg by mouth daily.     [provider]  cetirizine (ZYRTEC) 10 MG tablet Take 10 mg by mouth daily.    [provider]  fluticasone (FLONASE) 50 MCG/ACT nasal spray Place 2 sprays into both nostrils daily as needed for allergies or rhinitis. 07/21/16   Karamalegos, Devonne Doughty, DO  losartan (COZAAR) 25 MG tablet Take 1 tablet (25 mg total) by mouth daily. (take whole pill, do  not cut in half) 06/09/17   Karamalegos, Devonne Doughty, DO  metroNIDAZOLE (FLAGYL) 500 MG tablet Take 1 tablet (500 mg total) by mouth 2 (two) times daily for 7 days. 01/04/18 01/11/18  Sable Feil, PA-C  Multiple Vitamins-Minerals (MULTIVITAMIN WITH MINERALS) tablet Take 1 tablet by mouth daily.     [provider]  Omega-3 Fatty Acids (FISH OIL) 1000 MG CAPS Take 1,200 mg by mouth 2 (two) times daily.     [provider]  sulfamethoxazole-trimethoprim (BACTRIM DS,SEPTRA DS) 800-160 MG tablet Take 1 tablet by mouth 2 (two) times daily. 01/04/18   Sable Feil, PA-C    Allergies Keflex  [cephalexin]; Penicillins; and Tape  Family History  Problem Relation Age of Onset  . Coronary artery disease Mother   . Emphysema Father     Social History Social History   Tobacco Use  . Smoking status: Never Smoker  . Smokeless tobacco: Never Used  Substance Use Topics  . Alcohol use: Yes    Alcohol/week: 2.0 standard drinks    Types: 2 Glasses of wine per week  . Drug use: No    Review of Systems  Constitutional: No fever/chills Eyes: No visual changes. ENT: No sore throat. Cardiovascular: Denies chest pain. Respiratory: Denies shortness of breath. Gastrointestinal: No abdominal pain.  No nausea, no vomiting.  No diarrhea.  No constipation. Genitourinary: Negative for dysuria. Musculoskeletal: Negative for back pain. Skin: Negative for rash. Neurological: Negative for headaches, focal weakness or numbness. Psychiatric:Anxiety Endocrine:Hyperlipidemia and hypertension Hematological/Lymphatic: Allergic/Immunilogical: Keflex, penicillin, and tape. ____________________________________________   PHYSICAL EXAM:  VITAL SIGNS: ED Triage Vitals [01/04/18 1244]  Enc Vitals Group     BP (!) 168/82     Pulse Rate 74     Resp 16     Temp (!) 97.5 F (36.4 C)     Temp Source Oral     SpO2 99 %     Weight 174 lb (78.9 kg)     Height 5\' 7"  (1.702 m)     Head Circumference      Peak Flow      Pain Score 0     Pain Loc      Pain Edu?      Excl. in [place]?    Constitutional: Alert and oriented. Well appearing and in no acute distress. Cardiovascular: Normal rate, regular rhythm. Grossly normal heart sounds.  Good peripheral circulation. Respiratory: Normal respiratory effort.  No retractions. Lungs CTAB. Skin: Mild edema and erythema to the dorsal aspect of the left hand.  Scabbed over puncture wound to the dorsal aspect of the right hand.   Psychiatric: Mood and affect are normal. Speech and behavior are normal.  ____________________________________________     LABS (all labs ordered are listed, but only abnormal results are displayed)  Labs Reviewed - No data to display ____________________________________________  EKG   ____________________________________________  RADIOLOGY  ED MD interpretation:    Official radiology report(s): No results found.  ____________________________________________   PROCEDURES  Procedure(s) performed: None  Procedures  Critical Care performed: No  ____________________________________________   INITIAL IMPRESSION / ASSESSMENT AND PLAN / ED COURSE  As part of my medical decision making, I reviewed the following data within the [facility]    Patient presents with a 35-year-old dog bite to the left hand.  There is mild cellulitis.  Patient tetanus shot is not up-to-date.  Patient wound was clean and re-bandage.  Patient given a tetanus booster.  Patient given a  prescription for Flagyl and Bactrim.  Patient advised to return to the ED if edema erythema increases.  Skin marker outlined the wound before departure.      ____________________________________________   FINAL CLINICAL IMPRESSION(S) / ED DIAGNOSES  Final diagnoses:  Dog bite of left hand with infection, initial encounter     ED Discharge Orders         Ordered    metroNIDAZOLE (FLAGYL) 500 MG tablet  2 times daily     01/04/18 1315    sulfamethoxazole-trimethoprim (BACTRIM DS,SEPTRA DS) 800-160 MG tablet  2 times daily     01/04/18 1315           Note:  This document was prepared using Dragon voice recognition software and may include unintentional dictation errors.    Sable Feil, PA-C 01/04/18 1333    Earleen Newport, MD 01/05/18 [phone]

## 2018-01-05 ENCOUNTER — Other Ambulatory Visit: Payer: Self-pay

## 2018-01-05 LAB — CUP PACEART INCLINIC DEVICE CHECK
Brady Statistic RV Percent Paced: 47 %
Date Time Interrogation Session: 20190917153556
Implantable Lead Implant Date: 20150818
Implantable Lead Location: 753859
Implantable Lead Model: 1948
Lead Channel Pacing Threshold Amplitude: 0.5 V
Lead Channel Pacing Threshold Pulse Width: 0.5 ms
Lead Channel Sensing Intrinsic Amplitude: 12 mV
Lead Channel Setting Pacing Amplitude: 2 V
Lead Channel Setting Pacing Pulse Width: 0.5 ms
MDC IDC LEAD IMPLANT DT: 20150818
MDC IDC LEAD LOCATION: 753860
MDC IDC MSMT BATTERY REMAINING LONGEVITY: 127 mo
MDC IDC MSMT BATTERY VOLTAGE: 3.01 V
MDC IDC MSMT LEADCHNL RA IMPEDANCE VALUE: 387.5 Ohm
MDC IDC MSMT LEADCHNL RA PACING THRESHOLD PULSEWIDTH: 0.5 ms
MDC IDC MSMT LEADCHNL RA SENSING INTR AMPL: 5 mV
MDC IDC MSMT LEADCHNL RV IMPEDANCE VALUE: 762.5 Ohm
MDC IDC MSMT LEADCHNL RV PACING THRESHOLD AMPLITUDE: 0.5 V
MDC IDC PG IMPLANT DT: 20150818
MDC IDC SET LEADCHNL RV PACING AMPLITUDE: 0.75 V
MDC IDC SET LEADCHNL RV SENSING SENSITIVITY: 4 mV
MDC IDC STAT BRADY RA PERCENT PACED: 18 %
Pulse Gen Model: 2240
Pulse Gen Serial Number: 7664739

## 2018-01-06 ENCOUNTER — Other Ambulatory Visit: Payer: Self-pay

## 2018-01-11 ENCOUNTER — Other Ambulatory Visit: Payer: Self-pay | Admitting: Family Medicine

## 2018-01-11 ENCOUNTER — Encounter: Payer: Self-pay | Admitting: Family Medicine

## 2018-01-11 ENCOUNTER — Ambulatory Visit (INDEPENDENT_AMBULATORY_CARE_PROVIDER_SITE_OTHER): Payer: Medicare Other | Admitting: Family Medicine

## 2018-01-11 VITALS — BP 117/63 | HR 98 | Temp 98.2°F | Resp 16 | Ht 67.5 in | Wt 174.0 lb

## 2018-01-11 DIAGNOSIS — N183 Chronic kidney disease, stage 3 unspecified: Secondary | ICD-10-CM

## 2018-01-11 DIAGNOSIS — C8590 Non-Hodgkin lymphoma, unspecified, unspecified site: Secondary | ICD-10-CM

## 2018-01-11 DIAGNOSIS — C859A Non-Hodgkin lymphoma, unspecified, in remission: Secondary | ICD-10-CM

## 2018-01-11 DIAGNOSIS — W540XXA Bitten by dog, initial encounter: Secondary | ICD-10-CM | POA: Diagnosis not present

## 2018-01-11 DIAGNOSIS — I442 Atrioventricular block, complete: Secondary | ICD-10-CM

## 2018-01-11 DIAGNOSIS — R7303 Prediabetes: Secondary | ICD-10-CM

## 2018-01-11 DIAGNOSIS — I10 Essential (primary) hypertension: Secondary | ICD-10-CM

## 2018-01-11 DIAGNOSIS — Z0000 Encounter for general adult medical examination without abnormal findings: Secondary | ICD-10-CM

## 2018-01-11 DIAGNOSIS — E782 Mixed hyperlipidemia: Secondary | ICD-10-CM

## 2018-01-11 DIAGNOSIS — S61452A Open bite of left hand, initial encounter: Secondary | ICD-10-CM | POA: Diagnosis not present

## 2018-01-11 DIAGNOSIS — I25118 Atherosclerotic heart disease of native coronary artery with other forms of angina pectoris: Secondary | ICD-10-CM

## 2018-01-11 DIAGNOSIS — R351 Nocturia: Secondary | ICD-10-CM

## 2018-01-11 LAB — POCT GLYCOSYLATED HEMOGLOBIN (HGB A1C): Hemoglobin A1C: 5.6 % (ref 4.0–5.6)

## 2018-01-11 MED ORDER — MUPIROCIN 2 % EX OINT: 1.0000 "application " | TOPICAL_OINTMENT | Freq: Two times a day (BID) | CUTANEOUS | 0 refills | Status: DC

## 2018-01-11 NOTE — Assessment & Plan Note (Signed)
Improved PreDM A1c to 5.6, from prior 5.8 to 5.9 Complicated by CKD-III, CAD  Plan: 1. Remain off medications 2. Encouraged improved exercise with stationary bike as planned, continue to reduce carbs in diet and sweets 3. Follow-up to monitor in 6 months for yearly with labs

## 2018-01-11 NOTE — Progress Notes (Signed)
Subjective:    Patient ID: Clinton Joseph, male    DOB: 08/17/1927, 84 y.o.   MRN: [id number]  Clinton Joseph is a 84 y.o. male presenting on 01/11/2018 for Hypertension  HPI  CHRONIC HTNwith CKD-III He is doing well. No concerns. Recent readings outside office were good. He does not check BP at home. Current Meds - Losartan 25mg  daily Reports good compliance, took meds today. Tolerating well, w/o complaints. Denies CP, dyspnea, HA, edema, dizziness / lightheadedness  Pre-Diabetes Reports no concerns. Prior readings A1c 5.8 to 6.0. Now today improved 5.6 CBGs:none Meds:None Currently on ARB Lifestyle: - Diet (tries to adhere to DM diet, but still eating some occasional sweets, increasing water) - Exercise (less walking, he is active at home helping wife) Denies hypoglycemia, polyuria, visual changes, numbness or tingling.  Additional complaint today: - Left Hand Dog Bite / Pain Bruising Recent update, seen in the [facility] ED on 9/17 for Left hand dog bite, he states injury occurred on 9/14, he was helping pet poodle dog up the stairs, and he tried to help her up the stairs, and he tried to pick her up and the dog reared around and nipped his left hand biting it, and he had some skin tears with some bruising and bleeding. He showed this to his cardiologist when he had pacer check 3 days later on 9/17 and they sent him to the hospital ED for further treatment. They updated Td tetanus booster at that time. Gave him oral Flagyl and Bactrim antibiotic prophylaxis, however he could not tolerate these, had GI upset stomach diarrhea, and stopped after 1 day of medication, he is using neosporin now. He admits it is improving, less pain redness and swelling now. - Denies any fever chills sweats, drainage pus  Health Maintenance: Due for Flu Vaccine, not in stock today, will return 1-2 weeks  Depression screen [facility] 2/9 06/28/2017 03/23/2017 01/12/2017  Decreased Interest 0 0 0    Down, Depressed, Hopeless 0 0 0  PHQ - 2 Score 0 0 0    Social History   Tobacco Use  . Smoking status: Never Smoker  . Smokeless tobacco: Never Used  Substance Use Topics  . Alcohol use: Yes    Alcohol/week: 2.0 standard drinks    Types: 2 Glasses of wine per week  . Drug use: No    Review of Systems Per HPI unless specifically indicated above     Objective:    BP 117/63   Pulse 98   Temp 98.2 F (36.8 C) (Oral)   Resp 16   Ht 5' 7.5" (1.715 m)   Wt 174 lb (78.9 kg)   BMI 26.85 kg/m   Wt Readings from Last 3 Encounters:  01/11/18 174 lb (78.9 kg)  01/04/18 174 lb (78.9 kg)  01/04/18 175 lb 8 oz (79.6 kg)    Physical Exam  Constitutional: He is oriented to person, place, and time. He appears well-developed and well-nourished. No distress.  Well-appearing, comfortable, cooperative  HENT:  Head: Normocephalic and atraumatic.  Mouth/Throat: Oropharynx is clear and moist.  Eyes: Conjunctivae are normal. Right eye exhibits no discharge. Left eye exhibits no discharge.  Cardiovascular: Normal rate, regular rhythm, normal heart sounds and intact distal pulses.  No murmur heard. Pulmonary/Chest: Effort normal and breath sounds normal. No respiratory distress. He has no wheezes. He has no rales.  Musculoskeletal: Normal range of motion.  Left hand grip strength normal  Neurological: He is alert and oriented to person,  place, and time.  Distal sensation in bilateral hands intact, including left hand and fingers  Skin: Skin is warm and dry. He is not diaphoretic. No erythema.  Left hand Dorsal aspect with 2-3 superficial skin abrasions, now healing with scabs, no open ulceration, some local ecchymosis, no obvious erythema or warmth or drainage. Non tender  Psychiatric: He has a normal mood and affect. His behavior is normal.  Well groomed, good eye contact, normal speech and thoughts  Nursing note and vitals reviewed.   Recent Labs    03/23/17 2144 06/23/17 0820  01/11/18 1407  HGBA1C 5.8* 5.9* 5.6    Results for orders placed or performed in visit on 01/11/18  POCT HgB A1C  Result Value Ref Range   Hemoglobin A1C 5.6 4.0 - 5.6 %      Assessment & Plan:   Problem List Items Addressed This Visit    CKD (chronic kidney disease), stage III (HCC)    Stable chronic problem CKD-III, likely secondary to chronic HTN, age Last Cr improved to 1.15, GFR < 60 Not followed by Nephrology Improve hydration. Avoid NSAIDs. Continue ARB, low dose Follow-up q 6 months with yearly labs      Essential hypertension    Controlled HTN - Home BP readings none Complication with CKD-III, CAD    Plan:  1. Continue current BP regimen - Losartan 25mg  daily 2. Encourage improved lifestyle - low sodium diet, regular exercise 3. May monitor BP outside office 4. Follow-up 6 months      Pre-diabetes - Primary    Improved PreDM A1c to 5.6, from prior 5.8 to 5.9 Complicated by CKD-III, CAD  Plan: 1. Remain off medications 2. Encouraged improved exercise with stationary bike as planned, continue to reduce carbs in diet and sweets 3. Follow-up to monitor in 6 months for yearly with labs      Relevant Orders   POCT HgB A1C (Completed)    Other Visit Diagnoses    Dog bite of left hand, initial encounter       Relevant Medications   mupirocin ointment (BACTROBAN) 2 %    Clinically appears L hand dorsal dog bite / abrasion, is healing well. No open or non healing ulceration. No sign of secondary skin infection. Still has scabs. - Failed / intolerance to Flagyl / Bactrim antibiotic prophylaxis from ED UTD Tdap  Plan STOP Neosporin. Start Mupirocin topical antibiotic ointment BID x 7-10 days Routine skin hygiene Follow-up return criteria if not improve or worse    Meds ordered this encounter  Medications  . mupirocin ointment (BACTROBAN) 2 %    Sig: Apply 1 application topically 2 (two) times daily. For 7-10 days    Dispense:  22 g    Refill:  0     Follow up plan: Return in about 6 months (around 07/12/2018) for Annual Physical.  Future labs ordered for 07/05/18  Nobie Putnam, [facility] 01/11/2018, 6:50 PM

## 2018-01-11 NOTE — Assessment & Plan Note (Signed)
Controlled HTN - Home BP readings none Complication with CKD-III, CAD    Plan:  1. Continue current BP regimen - Losartan 25mg daily 2. Encourage improved lifestyle - low sodium diet, regular exercise 3. May monitor BP outside office 4. Follow-up 6 months 

## 2018-01-11 NOTE — Assessment & Plan Note (Signed)
Stable chronic problem CKD-III, likely secondary to chronic HTN, age Last Cr improved to 1.15, GFR < 60 Not followed by Nephrology Improve hydration. Avoid NSAIDs. Continue ARB, low dose Follow-up q 6 months with yearly labs

## 2018-01-11 NOTE — Patient Instructions (Addendum)
Thank you for coming to the office today.  Please schedule and return for a NURSE ONLY VISIT for VACCINE - Approximately 1-2 weeks in October 2019 - Need High Dose Flu Vaccine  For dog bite, stop neosporin, and start Mupirocin topical antibiotic for 7-10 days, this is stronger, only use on scabs,  Keep wound clean. If worsening spreading redness pain, or fever notify office or seek care again at hospital  DUE for [place] (no food or drink after midnight before the lab appointment, only water or coffee without cream/sugar on the morning of)  SCHEDULE "Lab Only" visit in the morning at the clinic for lab draw in 6 MONTHS   - Make sure Lab Only appointment is at about 1 week before your next appointment, so that results will be available  For Lab Results, once available within 2-3 days of blood draw, you can can log in to MyChart online to view your results and a brief explanation. Also, we can discuss results at next follow-up visit.   Please schedule a Follow-up Appointment to: Return in about 6 months (around 07/12/2018) for Annual Physical.  If you have any other questions or concerns, please feel free to call the office or send a message through [place]. You may also schedule an earlier appointment if necessary.  Additionally, you may be receiving a survey about your experience at our office within a few days to 1 week by e-mail or mail. We value your feedback.  Nobie Putnam, DO [place]

## 2018-01-17 LAB — CUP PACEART REMOTE DEVICE CHECK
Battery Remaining Longevity: 124 mo
Battery Remaining Percentage: 95.5 %
Battery Voltage: 3.01 V
Brady Statistic AP VS Percent: 17 %
Brady Statistic AS VS Percent: 37 %
Brady Statistic RV Percent Paced: 45 %
Date Time Interrogation Session: 20190828060023
Implantable Lead Implant Date: 20150818
Implantable Pulse Generator Implant Date: 20150818
Lead Channel Impedance Value: 380 Ohm
Lead Channel Pacing Threshold Amplitude: 0.5 V
Lead Channel Pacing Threshold Amplitude: 0.5 V
Lead Channel Pacing Threshold Pulse Width: 0.5 ms
Lead Channel Sensing Intrinsic Amplitude: 5 mV
Lead Channel Setting Pacing Amplitude: 0.75 V
Lead Channel Setting Pacing Pulse Width: 0.5 ms
Lead Channel Setting Sensing Sensitivity: 4 mV
MDC IDC LEAD IMPLANT DT: 20150818
MDC IDC LEAD LOCATION: 753859
MDC IDC LEAD LOCATION: 753860
MDC IDC MSMT LEADCHNL RA PACING THRESHOLD PULSEWIDTH: 0.5 ms
MDC IDC MSMT LEADCHNL RV IMPEDANCE VALUE: 760 Ohm
MDC IDC MSMT LEADCHNL RV SENSING INTR AMPL: 12 mV
MDC IDC PG SERIAL: 7664739
MDC IDC SET LEADCHNL RA PACING AMPLITUDE: 2 V
MDC IDC STAT BRADY AP VP PERCENT: 1.8 %
MDC IDC STAT BRADY AS VP PERCENT: 43 %
MDC IDC STAT BRADY RA PERCENT PACED: 18 %

## 2018-01-18 ENCOUNTER — Ambulatory Visit (INDEPENDENT_AMBULATORY_CARE_PROVIDER_SITE_OTHER): Payer: Medicare Other

## 2018-01-18 VITALS — BP 132/78 | HR 70 | Temp 97.8°F | Ht 68.0 in | Wt 174.6 lb

## 2018-01-18 DIAGNOSIS — Z0000 Encounter for general adult medical examination without abnormal findings: Secondary | ICD-10-CM

## 2018-01-18 NOTE — Patient Instructions (Addendum)
Clinton Joseph , Thank you for taking time to come for your Medicare Wellness Visit. I appreciate your ongoing commitment to your health goals. Please review the following plan we discussed and let me know if I can assist you in the future.   Screening recommendations/referrals: Colonoscopy: completed 04/20/2008 Recommended yearly ophthalmology/optometry visit for glaucoma screening and checkup Recommended yearly dental visit for hygiene and checkup  Vaccinations: Influenza vaccine: due now- declined today  Pneumococcal vaccine: completed series Tdap vaccine: completed 12/25/2017 Shingles vaccine: shingrix eligible, check with your insurance company for coverage     Advanced directives: Please bring a copy of your health care power of attorney and living will to the office at your convenience.  Conditions/risks identified: Recommend drinking at least 6-8 glasses of water a day   Next appointment: Follow up in one year for your annual wellness exam.   Preventive Care 82 Years and Older, Male Preventive care refers to lifestyle choices and visits with your health care provider that can promote health and wellness. What does preventive care include?  A yearly physical exam. This is also called an annual well check.  Dental exams once or twice a year.  Routine eye exams. Ask your health care provider how often you should have your eyes checked.  Personal lifestyle choices, including:  Daily care of your teeth and gums.  Regular physical activity.  Eating a healthy diet.  Avoiding tobacco and drug use.  Limiting alcohol use.  Practicing safe sex.  Taking low doses of aspirin every day.  Taking vitamin and mineral supplements as recommended by your health care provider. What happens during an annual well check? The services and screenings done by your health care provider during your annual well check will depend on your age, overall health, lifestyle risk factors, and family  history of disease. Counseling  Your health care provider may ask you questions about your:  Alcohol use.  Tobacco use.  Drug use.  Emotional well-being.  Home and relationship well-being.  Sexual activity.  Eating habits.  History of falls.  Memory and ability to understand (cognition).  Work and work Statistician. Screening  You may have the following tests or measurements:  Height, weight, and BMI.  Blood pressure.  Lipid and cholesterol levels. These may be checked every 5 years, or more frequently if you are over 44 years old.  Skin check.  Lung cancer screening. You may have this screening every year starting at age 61 if you have a 30-pack-year history of smoking and currently smoke or have quit within the past 15 years.  Fecal occult blood test (FOBT) of the stool. You may have this test every year starting at age 14.  Flexible sigmoidoscopy or colonoscopy. You may have a sigmoidoscopy every 5 years or a colonoscopy every 10 years starting at age 30.  Prostate cancer screening. Recommendations will vary depending on your family history and other risks.  Hepatitis C blood test.  Hepatitis B blood test.  Sexually transmitted disease (STD) testing.  Diabetes screening. This is done by checking your blood sugar (glucose) after you have not eaten for a while (fasting). You may have this done every 1-3 years.  Abdominal aortic aneurysm (AAA) screening. You may need this if you are a current or former smoker.  Osteoporosis. You may be screened starting at age 72 if you are at high risk. Talk with your health care provider about your test results, treatment options, and if necessary, the need for more tests. Vaccines  Your health care provider may recommend certain vaccines, such as:  Influenza vaccine. This is recommended every year.  Tetanus, diphtheria, and acellular pertussis (Tdap, Td) vaccine. You may need a Td booster every 10 years.  Zoster vaccine.  You may need this after age 24.  Pneumococcal 13-valent conjugate (PCV13) vaccine. One dose is recommended after age 26.  Pneumococcal polysaccharide (PPSV23) vaccine. One dose is recommended after age 69. Talk to your health care provider about which screenings and vaccines you need and how often you need them. This information is not intended to replace advice given to you by your health care provider. Make sure you discuss any questions you have with your health care provider. Document Released: 05/03/2015 Document Revised: 12/25/2015 Document Reviewed: 02/05/2015 Elsevier Interactive Patient Education  2017 [place] Prevention in the Home Falls can cause injuries. They can happen to people of all ages. There are many things you can do to make your home safe and to help prevent falls. What can I do on the outside of my home?  Regularly fix the edges of walkways and driveways and fix any cracks.  Remove anything that might make you trip as you walk through a door, such as a raised step or threshold.  Trim any bushes or trees on the path to your home.  Use bright outdoor lighting.  Clear any walking paths of anything that might make someone trip, such as rocks or tools.  Regularly check to see if handrails are loose or broken. Make sure that both sides of any steps have handrails.  Any raised decks and porches should have guardrails on the edges.  Have any leaves, snow, or ice cleared regularly.  Use sand or salt on walking paths during winter.  Clean up any spills in your garage right away. This includes oil or grease spills. What can I do in the bathroom?  Use night lights.  Install grab bars by the toilet and in the tub and shower. Do not use towel bars as grab bars.  Use non-skid mats or decals in the tub or shower.  If you need to sit down in the shower, use a plastic, non-slip stool.  Keep the floor dry. Clean up any water that spills on the floor as soon  as it happens.  Remove soap buildup in the tub or shower regularly.  Attach bath mats securely with double-sided non-slip rug tape.  Do not have throw rugs and other things on the floor that can make you trip. What can I do in the bedroom?  Use night lights.  Make sure that you have a light by your bed that is easy to reach.  Do not use any sheets or blankets that are too big for your bed. They should not hang down onto the floor.  Have a firm chair that has side arms. You can use this for support while you get dressed.  Do not have throw rugs and other things on the floor that can make you trip. What can I do in the kitchen?  Clean up any spills right away.  Avoid walking on wet floors.  Keep items that you use a lot in easy-to-reach places.  If you need to reach something above you, use a strong step stool that has a grab bar.  Keep electrical cords out of the way.  Do not use floor polish or wax that makes floors slippery. If you must use wax, use non-skid floor wax.  Do  not have throw rugs and other things on the floor that can make you trip. What can I do with my stairs?  Do not leave any items on the stairs.  Make sure that there are handrails on both sides of the stairs and use them. Fix handrails that are broken or loose. Make sure that handrails are as long as the stairways.  Check any carpeting to make sure that it is firmly attached to the stairs. Fix any carpet that is loose or worn.  Avoid having throw rugs at the top or bottom of the stairs. If you do have throw rugs, attach them to the floor with carpet tape.  Make sure that you have a light switch at the top of the stairs and the bottom of the stairs. If you do not have them, ask someone to add them for you. What else can I do to help prevent falls?  Wear shoes that:  Do not have high heels.  Have rubber bottoms.  Are comfortable and fit you well.  Are closed at the toe. Do not wear sandals.  If  you use a stepladder:  Make sure that it is fully opened. Do not climb a closed stepladder.  Make sure that both sides of the stepladder are locked into place.  Ask someone to hold it for you, if possible.  Clearly mark and make sure that you can see:  Any grab bars or handrails.  First and last steps.  Where the edge of each step is.  Use tools that help you move around (mobility aids) if they are needed. These include:  Canes.  Walkers.  Scooters.  Crutches.  Turn on the lights when you go into a dark area. Replace any light bulbs as soon as they burn out.  Set up your furniture so you have a clear path. Avoid moving your furniture around.  If any of your floors are uneven, fix them.  If there are any pets around you, be aware of where they are.  Review your medicines with your doctor. Some medicines can make you feel dizzy. This can increase your chance of falling. Ask your doctor what other things that you can do to help prevent falls. This information is not intended to replace advice given to you by your health care provider. Make sure you discuss any questions you have with your health care provider. Document Released: 01/31/2009 Document Revised: 09/12/2015 Document Reviewed: 05/11/2014 Elsevier Interactive Patient Education  2017 [employer].

## 2018-01-18 NOTE — Progress Notes (Signed)
Subjective:   Clinton Joseph is a 84 y.o. male who presents for Medicare Annual/Subsequent preventive examination.  Review of Systems:  Cardiac Risk Factors include: advanced age (>39men, >51 women);hypertension;dyslipidemia;male gender     Objective:    Vitals: BP 132/78 (BP Location: Left Arm, Patient Position: Sitting)   Pulse 70   Temp 97.8 F (36.6 C) (Oral)   Ht 5\' 8"  (1.727 m)   Wt 174 lb 9.6 oz (79.2 kg)   BMI 26.55 kg/m   Body mass index is 26.55 kg/m.  Advanced Directives 01/18/2018 01/04/2018 01/12/2017 12/05/2013  Does Patient Have a Medical Advance Directive? Yes No;Yes Yes Yes  Type of Paramedic of [place];Living will Living will [place];Living will [place];Living will  Does patient want to make changes to medical advance directive? - - - Yes - information given  Copy of [place] in Chart? No - copy requested - No - copy requested No - copy requested  Would patient like information on creating a medical advance directive? - No - Patient declined - -    Tobacco Social History   Tobacco Use  Smoking Status Never Smoker  Smokeless Tobacco Never Used     Counseling given: Not Answered   Clinical Intake:  Pre-visit preparation completed: Yes  Pain : No/denies pain Pain Score: 0-No pain     Nutritional Status: BMI 25 -29 Overweight Nutritional Risks: None Diabetes: No  How often do you need to have someone help you when you read instructions, pamphlets, or other written materials from your doctor or pharmacy?: 1 - Never What is the last grade level you completed in school?: some college   Interpreter Needed?: No  Information entered by :: Clinton Strozier,LPN   Past Medical History:  Diagnosis Date  . Anxiety state, unspecified   . Arthritis    a. Hands.  . Diffuse large B cell lymphoma ([facility])    a. 2007 s/p chemo - followed by Dr. Jeb Joseph.  . H/O  echocardiogram    a. 11/2013 Echo: EF 55-60%, mildly dil LA, mild to mod Ao sclerosis.  . Hyperlipidemia   . Hypertensive heart disease   . Lymphoma ([place])   . Mobitz II    a. 11/2013 syncope-->s/p SJM Assurity DR model [id number] (ser # [id number]).  . Non-obstructive CAD    a. 10/2010 Cath: >M nl, LAD/LCX min irregs, RCA 40p, EF 60%.  . Tremor    a. right hand  . Urticaria, unspecified   . Ventricular tachycardia (HCC)    a. 2012->broke with amio, neg w/u.   Past Surgical History:  Procedure Laterality Date  . APPENDECTOMY  1948  . CARDIAC CATHETERIZATION  11/17/2010   [facility]- LAD min irregs, LCX min irregs, RCA 40p, EF 60%.   Marland Kitchen CATARACT EXTRACTION, BILATERAL Bilateral ~ 2012  . INSERT / REPLACE / REMOVE PACEMAKER  12/05/2013  . PERMANENT PACEMAKER INSERTION N/A 12/05/2013   Procedure: PERMANENT PACEMAKER INSERTION;  Surgeon: Clinton Mark, MD;  Location: [facility];  Service: Cardiovascular;  Laterality: N/A;   Family History  Problem Relation Age of Onset  . Coronary artery disease Mother   . Emphysema Father    Social History   Socioeconomic History  . Marital status: Married    Spouse name: Not on file  . Number of children: Not on file  . Years of education: Not on file  . Highest education level: Some college, no degree  Occupational  History  . 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 Financial resource strain: Not hard at all  . Food insecurity:    Worry: Never true    Inability: Never true  . Transportation needs:    Medical: No    Non-medical: No  Tobacco Use  . Smoking status: Never Smoker  . Smokeless tobacco: Never Used  Substance and Sexual Activity  . Alcohol use: Yes    Alcohol/week: 2.0 standard drinks    Types: 2 Glasses of wine per week  . Drug use: No  . Sexual activity: Yes  Lifestyle  . Physical activity:    Days per week: 0 days    Minutes per session: 0 min  . Stress: Not at all  Relationships  . Social connections:     Talks on phone: Twice a week    Gets together: More than three times a week    Attends religious service: More than 4 times per year    Active member of club or organization: No    Attends meetings of clubs or organizations: Never    Relationship status: Married  Other Topics Concern  . Not on file  Social History Narrative  . Not on file    Outpatient Encounter Medications as of 01/18/2018  Medication Sig  . aspirin 81 MG tablet Take 81 mg by mouth daily.   . cetirizine (ZYRTEC) 10 MG tablet Take 10 mg by mouth daily.  . fluticasone (FLONASE) 50 MCG/ACT nasal spray Place 2 sprays into both nostrils daily as needed for allergies or rhinitis.  Marland Kitchen losartan (COZAAR) 25 MG tablet Take 1 tablet (25 mg total) by mouth daily. (take whole pill, do not cut in half)  . Multiple Vitamins-Minerals (MULTIVITAMIN WITH MINERALS) tablet Take 1 tablet by mouth daily.   . mupirocin ointment (BACTROBAN) 2 % Apply 1 application topically 2 (two) times daily. For 7-10 days  . Omega-3 Fatty Acids (FISH OIL) 1000 MG CAPS Take 1,200 mg by mouth 2 (two) times daily.    No facility-administered encounter medications on file as of 01/18/2018.     Activities of Daily Living In your present state of health, do you have any difficulty performing the following activities: 01/18/2018  Hearing? N  Comment hearing aids  Vision? N  Comment wears glasses   Difficulty concentrating or making decisions? N  Walking or climbing stairs? N  Dressing or bathing? N  Doing errands, shopping? N  Preparing Food and eating ? N  Using the Toilet? N  In the past six months, have you accidently leaked urine? N  Do you have problems with loss of bowel control? N  Managing your Medications? N  Managing your Finances? N  Housekeeping or managing your Housekeeping? N  Some recent data might be hidden    Patient Care Team: Clinton Hauser, DO as PCP - General (Family Medicine) Clinton Merritts, MD as Consulting  Physician (Cardiology)   Assessment:   This is a routine wellness examination for Clinton Joseph.  Exercise Activities and Dietary recommendations Current Exercise Habits: Home exercise routine, Time (Minutes): 10, Frequency (Times/Week): 3, Weekly Exercise (Minutes/Week): 30, Intensity: Mild, Exercise limited by: None identified  Goals    . Increase water intake     Recommend drinking at least 6-8 glasses of water a day        Fall Risk Fall Risk  01/18/2018 06/28/2017 03/23/2017 01/12/2017 08/18/2016  Falls in the past year? No No No No  No   FALL RISK PREVENTION PERTAINING TO THE HOME:  Any stairs in or around the home WITH handrails? Yes  Home free of loose throw rugs in walkways, pet beds, electrical cords, etc? Yes  Adequate lighting in your home to reduce risk of falls? Yes   ASSISTIVE DEVICES UTILIZED TO PREVENT FALLS:  Life alert? No  Use of a cane, walker or w/c? No  Grab bars in the bathroom? Yes  Shower chair or bench in shower? No  Elevated toilet seat or a handicapped toilet? Yes   DME ORDERS:  DME order needed?  No   TIMED UP AND GO:  Was the test performed? Yes .  Length of time to ambulate 10 feet: 8 sec.   GAIT:  Appearance of gait: Gait stead-fast without the use of an assistive device.    Depression Screen PHQ 2/9 Scores 01/18/2018 06/28/2017 03/23/2017 01/12/2017  PHQ - 2 Score 0 0 0 0    Cognitive Function     6CIT Screen 01/18/2018 01/12/2017  What Year? 0 points 0 points  What month? 0 points 0 points  What time? 0 points 0 points  Count back from 20 0 points 0 points  Months in reverse 0 points 0 points  Repeat phrase 2 points 2 points  Total Score 2 2    Immunization History  Administered Date(s) Administered  . Influenza, High Dose Seasonal PF 03/11/2015, 01/29/2016, 01/12/2017  . Influenza-Unspecified 01/18/2014  . Pneumococcal Conjugate-13 12/18/2013  . Pneumococcal-Unspecified 12/20/2011  . Tdap 11/20/2013, 01/04/2018    Qualifies for  Shingles Vaccine? Yes . Due for Shingrix. Education has been provided regarding the importance of this vaccine. Pt has been advised to call insurance company to determine out of pocket expense. Advised may also receive vaccine at local pharmacy or Health Dept. Verbalized acceptance and understanding.  Tetanus: completed 01/04/2018  Flu Vaccine: Due for Flu vaccine. Does the patient want to receive this vaccine today?  No . Education has been provided regarding the importance of this vaccine but still declined. Advised may receive this vaccine at local pharmacy or Health Dept. Aware to provide a copy of the vaccination record if obtained from local pharmacy or Health Dept. Verbalized acceptance and understanding.   Will get next week at office   Pneumococcal Vaccine: completed series Screening Tests Health Maintenance  Topic Date Due  . INFLUENZA VACCINE  11/18/2017  . TETANUS/TDAP  01/05/2028  . PNA vac Low Risk Adult  Completed   Cancer Screenings:  Colorectal Screening: Completed 04/20/2008. No longer required   Lung Cancer Screening: (Low Dose CT Chest recommended if Age 35-80 years, 30 pack-year currently smoking OR have quit w/in 15years.) does not qualify.   Additional Screening:  Hepatitis C Screening: does not qualify  Vision Screening: Recommended annual ophthalmology exams for early detection of glaucoma and other disorders of the eye. Is the patient up to date with their annual eye exam?  Yes  Who is the provider or what is the name of the office in which the pt attends annual eye exams? Dr.Woodard   Dental Screening: Recommended annual dental exams for proper oral hygiene  Community Resource Referral:  CRR required this visit?  No       Plan:    I have personally reviewed and addressed the Medicare Annual Wellness questionnaire and have noted the following in the patient's chart:  A. Medical and social history B. Use of alcohol, tobacco or illicit drugs  C. Current  medications and  supplements D. Functional ability and status E.  Nutritional status F.  Physical activity G. Advance directives H. List of other physicians I.  Hospitalizations, surgeries, and ER visits in previous 12 months J.  [place] such as hearing and vision if needed, cognitive and depression L. Referrals and appointments   In addition, I have reviewed and discussed with patient certain preventive protocols, quality metrics, and best practice recommendations. A written personalized care plan for preventive services as well as general preventive health recommendations were provided to patient.   Signed,  Tyler Aas, LPN Nurse Health Advisor   Nurse Notes:none

## 2018-01-25 ENCOUNTER — Ambulatory Visit (INDEPENDENT_AMBULATORY_CARE_PROVIDER_SITE_OTHER): Payer: Medicare Other

## 2018-01-25 DIAGNOSIS — Z23 Encounter for immunization: Secondary | ICD-10-CM | POA: Diagnosis not present

## 2018-02-03 ENCOUNTER — Ambulatory Visit: Payer: Medicare Other | Admitting: Nurse Practitioner

## 2018-02-23 ENCOUNTER — Ambulatory Visit (INDEPENDENT_AMBULATORY_CARE_PROVIDER_SITE_OTHER): Payer: Medicare Other | Admitting: Family Medicine

## 2018-02-23 ENCOUNTER — Encounter: Payer: Self-pay | Admitting: Family Medicine

## 2018-02-23 VITALS — BP 117/64 | HR 108 | Temp 97.9°F | Resp 16 | Ht 67.5 in | Wt 175.0 lb

## 2018-02-23 DIAGNOSIS — W540XXS Bitten by dog, sequela: Secondary | ICD-10-CM

## 2018-02-23 DIAGNOSIS — S61452S Open bite of left hand, sequela: Secondary | ICD-10-CM

## 2018-02-23 DIAGNOSIS — S41102A Unspecified open wound of left upper arm, initial encounter: Secondary | ICD-10-CM | POA: Diagnosis not present

## 2018-02-23 MED ORDER — DOXYCYCLINE HYCLATE 100 MG PO TABS: 100.0000 mg | ORAL_TABLET | Freq: Two times a day (BID) | ORAL | 0 refills | Status: DC

## 2018-02-23 NOTE — Progress Notes (Signed)
Subjective:    Patient ID: Clinton Joseph, male    DOB: 07/10/1927, 84 y.o.   MRN: [id number]  Clinton Joseph is a 84 y.o. male presenting on 02/23/2018 for Animal Bite (not healing had dog bite in past)   HPI  Follow-up Left Hand Dog Bite - Last visit with me 01/11/18, for initial visit for same problem L hand dog bite, initial injury occurred on 9/14 and ED visit 9/17, and he failed oral antibiotics due to GI intolerance and stopped, and has been rx mupirocin topically instead for initial course see prior notes for background information. - Interval update today with he is concerned now about 6 weeks later still has skin sore and area has not healed, describes some mild tender to touch and with some hand movements at times, has had some scab that comes and goes, never seems to heal, still raised spot where bite was Admits to few areas of similar non healing on skin from other abrasions Denies any fevers, chills, spreading redness or drainage of pus, other skin infection or sore   Depression screen [facility] 2/9 02/23/2018 01/18/2018 06/28/2017  Decreased Interest 0 0 0  Down, Depressed, Hopeless 0 0 0  PHQ - 2 Score 0 0 0    Social History   Tobacco Use  . Smoking status: Never Smoker  . Smokeless tobacco: Never Used  Substance Use Topics  . Alcohol use: Yes    Alcohol/week: 2.0 standard drinks    Types: 2 Glasses of wine per week  . Drug use: No    Review of Systems Per HPI unless specifically indicated above     Objective:    BP 117/64   Pulse (!) 108   Temp 97.9 F (36.6 C) (Oral)   Resp 16   Ht 5' 7.5" (1.715 m)   Wt 175 lb (79.4 kg)   BMI 27.00 kg/m   Wt Readings from Last 3 Encounters:  02/23/18 175 lb (79.4 kg)  01/18/18 174 lb 9.6 oz (79.2 kg)  01/11/18 174 lb (78.9 kg)    Physical Exam  Constitutional: He is oriented to person, place, and time. He appears well-developed and well-nourished. No distress.  Well-appearing, comfortable, cooperative    Cardiovascular: Normal rate.  Pulmonary/Chest: Effort normal.  Musculoskeletal: He exhibits no edema.  R Hand ROM is normal Some pain with grip and thumb movement due to proximity of wound Strength intact  Neurological: He is alert and oriented to person, place, and time.  Distal sensation remains intact to light touch L hand and fingers  Skin: Skin is warm and dry. No rash noted. He is not diaphoretic.  Left hand, dorsal approx 1 cm raised indurated lesion, appears to have central small ulceration with scab and surrounding dry flaking skin, some appearance of granulation tissue or healing skin, still has significant raised appearance and local redness without obvious erythema, some blanching on palpation, mild tender to touch, no drainage  Psychiatric: He has a normal mood and affect. His behavior is normal.  Well groomed, good eye contact, normal speech and thoughts  Nursing note and vitals reviewed.     LEFT Hand dorsal      Results for orders placed or performed in visit on 01/11/18  POCT HgB A1C  Result Value Ref Range   Hemoglobin A1C 5.6 4.0 - 5.6 %      Assessment & Plan:   Problem List Items Addressed This Visit    None    Visit Diagnoses  Dog bite of left hand, sequela    -  Primary   Relevant Medications   doxycycline (VIBRA-TABS) 100 MG tablet   Non-healing wound of upper extremity, left, initial encounter       Relevant Medications   doxycycline (VIBRA-TABS) 100 MG tablet      Clinically with non healing injury to L dorsal hand now >6 weeks from dog bite initially Does not seem to be acutely infected, but has some slight erythema and central ulceration not quite healing Suspect delayed healing, variety of factors including elderly 84 year old patient  Plan Given previously did not tolerate oral antibiotics due to GI intolerance (Flagyl / Bactrim) and only failed topical mupirocin therapy, we agreed today to repeat trial of different antibiotic to  empirically cover incase it is secondarily infected from regular daily use -  Start taking Doxycycline antibiotic 100mg  twice daily for 7 days - admin precautions given - Stop mupirocin - May cover if outdoor or active, otherwise if resting does not need to cover - Routine skin hygiene Follow-up within 1 week - may call if not improved, can refer to [place] if needed as next step - considered Santyl ointment but no significant ulceration.   Meds ordered this encounter  Medications  . doxycycline (VIBRA-TABS) 100 MG tablet    Sig: Take 1 tablet (100 mg total) by mouth 2 (two) times daily. For 7 days. Take with full glass of water, stay upright 30 min after taking.    Dispense:  14 tablet    Refill:  0     Follow up plan: Return in about 1 week (around 03/02/2018), or if symptoms worsen or fail to improve, for wound hand.   Nobie Putnam, [facility] 02/23/2018, 3:09 PM

## 2018-02-23 NOTE — Patient Instructions (Addendum)
Thank you for coming to the office today.  It seems to not be healing, does not seem truly infected but some redness we can cover once more with different antibiotic  Start taking Doxycycline antibiotic 100mg  twice daily for 7 days. Take with full glass of water and stay upright for at least 30 min after taking, may be seated or standing, but should NOT lay down. This is just a safety precaution, if this medicine does not go all the way down throat well it could cause some burning discomfort to throat and esophagus.  If is not significantly better by 1 week after finish antibiotic, then call our office back and we can refer you to [facility] [address] [place],  [place]  [address] Main: [phone]   Please schedule a Follow-up Appointment to: Return in about 1 week (around 03/02/2018), or if symptoms worsen or fail to improve, for wound hand.  If you have any other questions or concerns, please feel free to call the office or send a message through [place]. You may also schedule an earlier appointment if necessary.  Additionally, you may be receiving a survey about your experience at our office within a few days to 1 week by e-mail or mail. We value your feedback.  Nobie Putnam, DO [place]

## 2018-03-14 DIAGNOSIS — M79674 Pain in right toe(s): Secondary | ICD-10-CM | POA: Diagnosis not present

## 2018-03-14 DIAGNOSIS — L600 Ingrowing nail: Secondary | ICD-10-CM | POA: Diagnosis not present

## 2018-03-14 DIAGNOSIS — M79675 Pain in left toe(s): Secondary | ICD-10-CM | POA: Diagnosis not present

## 2018-03-14 DIAGNOSIS — B351 Tinea unguium: Secondary | ICD-10-CM | POA: Diagnosis not present

## 2018-03-16 ENCOUNTER — Ambulatory Visit (INDEPENDENT_AMBULATORY_CARE_PROVIDER_SITE_OTHER): Payer: Medicare Other

## 2018-03-16 DIAGNOSIS — I441 Atrioventricular block, second degree: Secondary | ICD-10-CM | POA: Diagnosis not present

## 2018-03-16 DIAGNOSIS — I471 Supraventricular tachycardia: Secondary | ICD-10-CM

## 2018-03-16 NOTE — Progress Notes (Signed)
Remote pacemaker transmission.   

## 2018-04-04 ENCOUNTER — Encounter: Payer: Self-pay | Admitting: Family Medicine

## 2018-04-04 ENCOUNTER — Ambulatory Visit: Payer: Medicare Other | Admitting: Family Medicine

## 2018-04-04 ENCOUNTER — Ambulatory Visit (INDEPENDENT_AMBULATORY_CARE_PROVIDER_SITE_OTHER): Payer: Medicare Other | Admitting: Family Medicine

## 2018-04-04 VITALS — BP 128/74 | HR 111 | Temp 98.1°F | Resp 16 | Ht 67.5 in | Wt 178.0 lb

## 2018-04-04 DIAGNOSIS — S61452A Open bite of left hand, initial encounter: Secondary | ICD-10-CM

## 2018-04-04 DIAGNOSIS — W540XXA Bitten by dog, initial encounter: Secondary | ICD-10-CM

## 2018-04-04 DIAGNOSIS — F330 Major depressive disorder, recurrent, mild: Secondary | ICD-10-CM

## 2018-04-04 DIAGNOSIS — F419 Anxiety disorder, unspecified: Secondary | ICD-10-CM | POA: Diagnosis not present

## 2018-04-04 DIAGNOSIS — F3341 Major depressive disorder, recurrent, in partial remission: Secondary | ICD-10-CM | POA: Insufficient documentation

## 2018-04-04 MED ORDER — SERTRALINE HCL 50 MG PO TABS: ORAL_TABLET | ORAL | 2 refills | Status: DC

## 2018-04-04 MED ORDER — MUPIROCIN 2 % EX OINT: 1.0000 "application " | TOPICAL_OINTMENT | Freq: Two times a day (BID) | CUTANEOUS | 1 refills | Status: DC

## 2018-04-04 NOTE — Patient Instructions (Addendum)
Thank you for coming to the office today.  Letter written today as requested based on current health situation  Start Sertraline half tab daily for 2 weeks if helping - may increase dose to 50mg  (1 whole tablet) daily for next several weeks to months until you return to our office, call if run out - we should keep this medicine going for now.  For dog bite - use topical antibiotic ointment Mupirocin, it does not look acutely infected today, it may have some superficial infection, but this should be treated with topical medicine for now. May use soap and water. If not improving, worsening red, pain, drainage of pus, or fever chills then return and we can consider another antibiotic course.  May use work gloves to protect hands when holding or walking dog just to be safe avoid more bites.  Please schedule a Follow-up Appointment to: Return if symptoms worsen or fail to improve, for 1-2 weeks if dog bite worsen / keep apt in March 2020.  If you have any other questions or concerns, please feel free to call the office or send a message through [place]. You may also schedule an earlier appointment if necessary.  Additionally, you may be receiving a survey about your experience at our office within a few days to 1 week by e-mail or mail. We value your feedback.  Nobie Putnam, DO [place]

## 2018-04-04 NOTE — Progress Notes (Signed)
Subjective:    Patient ID: Clinton Joseph, male    DOB: 01-02-1928, 84 y.o.   MRN: [id number]  Clinton Joseph is a 84 y.o. male presenting on 04/04/2018 for Anxiety   HPI   Anxiety w/ mixed Depression, chronic recurrent Reports prior history of mood disorder with some depression, has been on SSRI in past, cannot recall med. Has been off for a while. He reports some recent worsening anxiety symptoms, see scores below GAD and PHQ score. He is recently worried about variety of stressors and topics including family stressors. Additionally recent trip planned, he is worried about air travel w/ his anxiety, he admits stress and anxious worse with traveling. - Denies any suicidal or homicidal thoughts, panic attacks, insomnia  Left Hand recurrent dog bite - Last visit with me 02/23/18, for same problem prior dog bite, treated with mupirocin in past, however did not resolve, he was last treated with doxycycline, see prior notes for background information. - Interval update with previous bite healed and he got a new bite from his dog, trying to carry the dog, accidental bite with skin tear - Today patient reports recent new injury, has not used any topical antibiotic, needs refill, finished previous antibiotic before this bite - Denies any drainage of pus, fever, chills, spreading redness, other injury   Depression screen [facility] 2/9 04/04/2018 02/23/2018 01/18/2018  Decreased Interest 1 0 0  Down, Depressed, Hopeless 3 0 0  PHQ - 2 Score 4 0 0  Altered sleeping 0 - -  Tired, decreased energy 0 - -  Change in appetite 0 - -  Feeling bad or failure about yourself  3 - -  Trouble concentrating 0 - -  Moving slowly or fidgety/restless 0 - -  Suicidal thoughts 0 - -  PHQ-9 Score 7 - -  Difficult doing work/chores Not difficult at all - -   GAD 7 : Generalized Anxiety Score 04/04/2018  Nervous, Anxious, on Edge 3  Control/stop worrying 3  Worry too much - different things 3  Trouble  relaxing 1  Restless 2  Easily annoyed or irritable 0  Afraid - awful might happen 3  Total GAD 7 Score 15  Anxiety Difficulty Not difficult at all    Social History   Tobacco Use  . Smoking status: Never Smoker  . Smokeless tobacco: Never Used  Substance Use Topics  . Alcohol use: Yes    Alcohol/week: 2.0 standard drinks    Types: 2 Glasses of wine per week  . Drug use: No    Review of Systems Per HPI unless specifically indicated above     Objective:    BP 128/74   Pulse (!) 111   Temp 98.1 F (36.7 C) (Oral)   Resp 16   Ht 5' 7.5" (1.715 m)   Wt 178 lb (80.7 kg)   BMI 27.47 kg/m   Wt Readings from Last 3 Encounters:  04/04/18 178 lb (80.7 kg)  02/23/18 175 lb (79.4 kg)  01/18/18 174 lb 9.6 oz (79.2 kg)    Physical Exam Vitals signs and nursing note reviewed.  Constitutional:      General: He is not in acute distress.    Appearance: He is well-developed. He is not diaphoretic.     Comments: Well-appearing, comfortable, cooperative  HENT:     Head: Normocephalic and atraumatic.  Eyes:     General:        Right eye: No discharge.  Left eye: No discharge.     Conjunctiva/sclera: Conjunctivae normal.  Cardiovascular:     Comments: Tachycardic Pulmonary:     Effort: Pulmonary effort is normal.  Skin:    General: Skin is warm and dry.     Findings: No rash.     Comments: Left dorsal hand, similar location to previous bite that has healed. Has superficial skin tear, some local erythema.  Neurological:     Mental Status: He is alert and oriented to person, place, and time.  Psychiatric:        Behavior: Behavior normal.     Comments: Well groomed, good eye contact, normal speech and thoughts . Appears mildly anxious but comfortable      Left Hand     Results for orders placed or performed in visit on 01/11/18  POCT HgB A1C  Result Value Ref Range   Hemoglobin A1C 5.6 4.0 - 5.6 %      Assessment & Plan:   Problem List Items Addressed  This Visit    Anxiety - Primary   Relevant Medications   sertraline (ZOLOFT) 50 MG tablet   Mild episode of recurrent major depressive disorder (HCC)   Relevant Medications   sertraline (ZOLOFT) 50 MG tablet  Suspected recurrent flare of mixed anxiety / depression, prior history of mood disorder, now with worsening anxiety symptoms affecting his function, previously coped better Elevated GAD score - Prior SSRI years ago, unsure which med - No prior Psych  Plan: 1. Discussion on anxiety / mood, management, complications 2. Start Sertraline 25-50mg  daily AM with food, counseling on potential side effects risks - start with half tab for now 1-2 weeks then may titrate up as tolerated Follow-up within 3 months as scheduled sooner if worsening  Additionally letter written for patient, stating due to his medical concerns with anxiety/depression, he should not travel currently, regarding air travel due to may increase his anxiety/depression flare.      Other Visit Diagnoses    Dog bite of left hand, initial encounter       Relevant Medications   mupirocin ointment (BACTROBAN) 2 %      Clinically with recurrent superficial dorsal L hand skin tear abrasion from another dog injury, accidental - previously treated with mupirocin, most recent doxycycline - currently without sign of significant cellulitis or secondary infection  Plan Rx refill Mupirocin topical antibiotic BID for 7-10 days, reviewed appropriate hygiene Advised him to wear some sturdy gloves perhaps while handling dog to avoid recurrent injury  Meds ordered this encounter  Medications  . sertraline (ZOLOFT) 50 MG tablet    Sig: Start taking half tablet daily for 2 weeks, if needed may increase to 1 whole tablet daily    Dispense:  30 tablet    Refill:  2  . mupirocin ointment (BACTROBAN) 2 %    Sig: Apply 1 application topically 2 (two) times daily. For 7-10 days    Dispense:  22 g    Refill:  1    Follow up  plan: Return if symptoms worsen or fail to improve, for 1-2 weeks if dog bite worsen / keep apt in March 2020.  Nobie Putnam, DO [facility] 04/04/2018, 4:14 PM

## 2018-04-11 ENCOUNTER — Telehealth: Payer: Self-pay | Admitting: Family Medicine

## 2018-04-11 ENCOUNTER — Telehealth: Payer: Self-pay

## 2018-04-11 DIAGNOSIS — F419 Anxiety disorder, unspecified: Secondary | ICD-10-CM

## 2018-04-11 MED ORDER — ESCITALOPRAM OXALATE 5 MG PO TABS: 5.0000 mg | ORAL_TABLET | Freq: Every day | ORAL | 2 refills | Status: DC

## 2018-04-11 NOTE — Telephone Encounter (Signed)
Patient was last seen on 04/04/18 see note for details.  Called him today 12/23 and he reports symptoms of nausea on Sertraline newly started. He is still taking HALF tablet of the 50mg  daily = 25mg . He has mild nausea, even taking it with food. No vomiting. He prefers to switch med - I sent in new rx Escitalopram 5mg  daily to local pharmacy, stop sertraline and start escitalopram daily = follow-up as planned  Nobie Putnam, [facility] 04/11/2018, 12:25 PM

## 2018-04-11 NOTE — Telephone Encounter (Signed)
LVM for pt. Per Dr Caryl Comes, he would like pt to be seen in his clinic or AF clinic to discuss anticoagulation.

## 2018-04-11 NOTE — Telephone Encounter (Signed)
Pt said sertraline is making him sick on his stomach.  His call back number is [phone]

## 2018-04-14 ENCOUNTER — Other Ambulatory Visit: Payer: Self-pay

## 2018-04-14 ENCOUNTER — Other Ambulatory Visit: Payer: Self-pay | Admitting: Family Medicine

## 2018-04-14 DIAGNOSIS — W540XXA Bitten by dog, initial encounter: Secondary | ICD-10-CM

## 2018-04-14 DIAGNOSIS — S61452A Open bite of left hand, initial encounter: Secondary | ICD-10-CM

## 2018-04-14 MED ORDER — MUPIROCIN 2 % EX OINT: 1.0000 "application " | TOPICAL_OINTMENT | Freq: Two times a day (BID) | CUTANEOUS | 1 refills | Status: DC

## 2018-04-14 NOTE — Progress Notes (Signed)
Cardiology Office Note  Date:  04/15/2018   ID:  Clinton Joseph, DOB 05-13-1927, MRN [id number]  PCP:  Olin Hauser, DO   Chief Complaint  Patient presents with  . Other    12 month follow up. Patient states his PCP gave him lexapro for anxiety that caused SOB so he quit taking it. Meds reviewed verbally with patient.     HPI:  [name] is a very pleasant 84 year old gentleman  CAD lymphoma 2006 with chemotherapy x7 cycles at that time. Notes indicating run of VT, sustained with presentation to the emergency room in 2012. Rate was 150 beats per minute lasting more than 3 seconds. He was given IV amiodarone and converted to normal sinus rhythm. Evaluation at that time showing nonobstructive CAD, normal ejection fraction. hypertension bradycardia, heart block in August 2015 requiring pacemaker He presents for follow-up of his arrhythmia  Seen by PMD 03/1618 Suspected recurrent flare of mixed anxiety / depression, prior history of mood disorder, now with worsening anxiety symptoms affecting his function, previously coped better Started on Lexapro, caused SOB, stomach problem Stopped the pill Started on Sertraline Stopped secondary to nausea Now feels fine, no complaints  Recent phone calls from pacer clinic in [facility] showing atrial fib, persistent No sx per the patient Perhaps slightly increased ankle swelling per the wife but patient denies any significant shortness of breath or chest pain, PND orthopnea He is only on aspirin  On prior office visit November 2018 reported having less than 5 minutes of heavy palpitations, had some dizziness Was at church at the time Symptoms resolved without intervention  CHADS VASC around 5, age, peripheral disease  No recent BMP  EKG personally reviewed by myself on todays visit Shows atrial fibrillation/flutter ventricular rate 70 bpm Ventricularly paced   other past medical history Prior cardiac  catheterization aug 2015 showed 40% proximal RCA disease, minimal mid left circumflex disease normal ejection fraction at that time,  Prior echocardiogram showing normal LV systolic function, otherwise normal study prior EKGs and 2012 showed normal sinus rhythm with right bundle branch block, left anterior fascicular block   PMH:   has a past medical history of Anxiety state, unspecified, Arthritis, Diffuse large B cell lymphoma ([place]), H/O echocardiogram, Hyperlipidemia, Hypertensive heart disease, Lymphoma ([place]), Mobitz II, Non-obstructive CAD, Tremor, Urticaria, unspecified, and Ventricular tachycardia ([place]).  PSH:    Past Surgical History:  Procedure Laterality Date  . APPENDECTOMY  1948  . CARDIAC CATHETERIZATION  11/17/2010   [facility]- LAD min irregs, LCX min irregs, RCA 40p, EF 60%.   Marland Kitchen CATARACT EXTRACTION, BILATERAL Bilateral ~ 2012  . INSERT / REPLACE / REMOVE PACEMAKER  12/05/2013  . PERMANENT PACEMAKER INSERTION N/A 12/05/2013   Procedure: PERMANENT PACEMAKER INSERTION;  Surgeon: Coralyn Mark, MD;  Location: [facility] CATH LAB;  Service: Cardiovascular;  Laterality: N/A;    Current Outpatient Medications  Medication Sig Dispense Refill  . cetirizine (ZYRTEC) 10 MG tablet Take 10 mg by mouth daily.    . enalapril (VASOTEC) 2.5 MG tablet Take by mouth.    . fluticasone (FLONASE) 50 MCG/ACT nasal spray Place 2 sprays into both nostrils daily as needed for allergies or rhinitis. 48 g 3  . losartan (COZAAR) 25 MG tablet Take 1 tablet (25 mg total) by mouth daily. (take whole pill, do not cut in half) 90 tablet 3  . Multiple Vitamins-Minerals (MULTIVITAMIN WITH MINERALS) tablet Take 1 tablet by mouth daily.     . mupirocin ointment (BACTROBAN) 2 %  Apply 1 application topically 2 (two) times daily. For 7-10 days 22 g 1  . Omega-3 Fatty Acids (FISH OIL) 1000 MG CAPS Take 1,200 mg by mouth 2 (two) times daily.     Marland Kitchen apixaban (ELIQUIS) 5 MG TABS tablet Take 1 tablet (5 mg total) by mouth 2 (two) times  daily. 60 tablet 6   No current facility-administered medications for this visit.      Allergies:   Keflex [cephalexin]; Penicillins; and Tape   Social History:  The patient  reports that he has never smoked. He has never used smokeless tobacco. He reports current alcohol use of about 2.0 standard drinks of alcohol per week. He reports that he does not use drugs.   Family History:   family history includes Coronary artery disease in his mother; Emphysema in his father.    Review of Systems: Review of Systems  Constitutional: Negative.   Respiratory: Negative.   Cardiovascular: Negative.   Gastrointestinal: Negative.   Musculoskeletal: Negative.   Neurological: Negative.   Psychiatric/Behavioral: Negative.   All other systems reviewed and are negative.    PHYSICAL EXAM: VS:  BP 132/75 (BP Location: Left Arm, Patient Position: Sitting, Cuff Size: Normal)   Pulse 70   Ht 5' 7.5" (1.715 m)   Wt 175 lb (79.4 kg)   BMI 27.00 kg/m  , BMI Body mass index is 27 kg/m. Constitutional:  oriented to person, place, and time. No distress.  HENT:  Head: Grossly normal Eyes:  no discharge. No scleral icterus.  Neck: No JVD, no carotid bruits  Cardiovascular: Regular rate and rhythm, no murmurs appreciated Pulmonary/Chest: Clear to auscultation bilaterally, no wheezes or rails Abdominal: Soft.  no distension.  no tenderness.  Musculoskeletal: Normal range of motion Neurological:  normal muscle tone. Coordination normal. No atrophy Skin: Skin warm and dry Psychiatric: normal affect, pleasant    Recent Labs: 06/23/2017: ALT 29; BUN 23; Creat 1.15; Hemoglobin 13.3; Platelets 117; Potassium 4.3; Sodium 142    Lipid Panel Lab Results  Component Value Date   CHOL 208 (H) 06/23/2017   HDL 43 06/23/2017   LDLCALC 135 (H) 06/23/2017   TRIG 166 (H) 06/23/2017      Wt Readings from Last 3 Encounters:  04/15/18 175 lb (79.4 kg)  04/04/18 178 lb (80.7 kg)  02/23/18 175 lb (79.4 kg)        ASSESSMENT AND PLAN:  Essential hypertension, benign - Plan: EKG 12-Lead Blood pressure is well controlled on today's visit. No changes made to the medications. Stable  Atrial fibrillation/flutter Long discussion with him concerning anticoagulation risk and benefit We have recommended he stop aspirin Discussed various treatment options for anticoagulation, he chooses Eliquis 5 twice daily We have ordered base metabolic panel today -We will arrange follow-up with Dr. Caryl Comes for further discussion -Currently inclined not to cardiovert or restore normal sinus rhythm given he is asymptomatic and likely has paroxysmal disease -He is currently in agreement with that as he feels well  Complete heart block ([place]) - Plan: EKG 12-Lead Pacer in place, followed by Dr. Caryl Comes Recent pacer download reviewed with him showing atrial fibrillation/flutter  VT (ventricular tachycardia) ([place])  previously treated with amiodarone Denies any recent severe episodes of palpitations.    Arteriosclerosis of coronary artery Very mild coronary disease 2 years ago by catheterization No further work-up at this time   Long discussion concerning new pacemaker findings, atrial fibrillation, risk and benefit of stroke, risk and benefit of anticoagulation  Total encounter time  more than 45 minutes  Greater than 50% was spent in counseling and coordination of care with the patient  Disposition:   F/U  12 months   Orders Placed This Encounter  Procedures  . Basic Metabolic Panel (BMET)  . EKG 12-Lead     Signed, Esmond Plants, M.D., Ph.D. 04/15/2018  [place], [place]

## 2018-04-14 NOTE — Telephone Encounter (Signed)
Left message for patient to call office back to get an appointment with Dr Caryl Comes or Roderic Palau in [place] clinic to discuss anticoagulation as his PPM showed afib of > than 2 days.

## 2018-04-14 NOTE — Telephone Encounter (Signed)
Pt asked for a refill on mupirocin cream.

## 2018-04-15 ENCOUNTER — Ambulatory Visit: Payer: Medicare Other | Admitting: Cardiovascular Disease

## 2018-04-15 ENCOUNTER — Encounter: Payer: Self-pay | Admitting: Cardiovascular Disease

## 2018-04-15 VITALS — BP 132/75 | HR 70 | Ht 67.5 in | Wt 175.0 lb

## 2018-04-15 DIAGNOSIS — C8590 Non-Hodgkin lymphoma, unspecified, unspecified site: Secondary | ICD-10-CM

## 2018-04-15 DIAGNOSIS — I471 Supraventricular tachycardia: Secondary | ICD-10-CM

## 2018-04-15 DIAGNOSIS — I25118 Atherosclerotic heart disease of native coronary artery with other forms of angina pectoris: Secondary | ICD-10-CM

## 2018-04-15 DIAGNOSIS — I10 Essential (primary) hypertension: Secondary | ICD-10-CM

## 2018-04-15 DIAGNOSIS — N183 Chronic kidney disease, stage 3 unspecified: Secondary | ICD-10-CM

## 2018-04-15 DIAGNOSIS — I442 Atrioventricular block, complete: Secondary | ICD-10-CM

## 2018-04-15 DIAGNOSIS — E782 Mixed hyperlipidemia: Secondary | ICD-10-CM | POA: Diagnosis not present

## 2018-04-15 MED ORDER — APIXABAN 5 MG PO TABS: 5.0000 mg | ORAL_TABLET | Freq: Two times a day (BID) | ORAL | 6 refills | Status: DC

## 2018-04-15 NOTE — Patient Instructions (Addendum)
Follow up with Dr. Caryl Comes, EP   \Medication Instructions:   Please stop the aspirin  Start eliquis 5 mg twice a day  If you need a refill on your cardiac medications before your next appointment, please call your pharmacy.    Lab work: [employer] today   If you have labs (blood work) drawn today and your tests are completely normal, you will receive your results only by: Marland Kitchen MyChart Message (if you have MyChart) OR . A paper copy in the mail If you have any lab test that is abnormal or we need to change your treatment, we will call you to review the results.   Testing/Procedures: No new testing needed   Follow-Up: At [facility], you and your health needs are our priority.  As part of our continuing mission to provide you with exceptional heart care, we have created designated Provider Care Teams.  These Care Teams include your primary Cardiologist (physician) and Advanced Practice Providers (APPs -  Physician Assistants and Nurse Practitioners) who all work together to provide you with the care you need, when you need it.  . You will need a follow up appointment in 12 months .   Please call our office 2 months in advance to schedule this appointment.    . Providers on your designated Care Team:   . Murray Hodgkins, NP . Christell Faith, PA-C . Marrianne Mood, PA-C  Any Other Special Instructions Will Be Listed Below (If Applicable).  For educational health videos Log in to : [email or web address] Or : [email or web address], password : [email or web address]

## 2018-04-15 NOTE — Telephone Encounter (Signed)
Apt scheduled for 05/03/2018 w/ SK

## 2018-04-16 LAB — BASIC METABOLIC PANEL
BUN / CREAT RATIO: 21 (ref 10–24)
BUN: 27 mg/dL (ref 8–27)
CHLORIDE: 102 mmol/L (ref 96–106)
CO2: 24 mmol/L (ref 20–29)
CREATININE: 1.29 mg/dL — AB (ref 0.76–1.27)
Calcium: 10.1 mg/dL (ref 8.6–10.2)
GFR calc Af Amer: 57 mL/min/{1.73_m2} — ABNORMAL LOW (ref >59)
GFR calc non Af Amer: 49 mL/min/{1.73_m2} — ABNORMAL LOW (ref >59)
GLUCOSE: 103 mg/dL — AB (ref 65–99)
Potassium: 4.8 mmol/L (ref 3.5–5.2)
SODIUM: 139 mmol/L (ref 134–144)

## 2018-04-21 ENCOUNTER — Telehealth: Payer: Self-pay | Admitting: *Deleted

## 2018-04-21 NOTE — Telephone Encounter (Signed)
Left voicemail message to call back for results.  

## 2018-04-21 NOTE — Telephone Encounter (Signed)
Patient here in office with his wife seeing different provider. Reviewed results and they verbalized understanding with no further questions.

## 2018-04-21 NOTE — Telephone Encounter (Signed)
-----   Message from Minna Merritts, MD sent at 04/17/2018 12:25 PM EST ----- Bmp Renal function stable He is on the right amount of blood thinner

## 2018-04-25 ENCOUNTER — Telehealth: Payer: Self-pay | Admitting: Cardiovascular Disease

## 2018-04-25 NOTE — Telephone Encounter (Signed)
Pt c/o medication issue:  1. Name of Medication: Eliquis  2. How are you currently taking this medication (dosage and times per day)? 5 MG - 1 tablet 2 times daily   3. Are you having a reaction (difficulty breathing--STAT)? Feet has been swelling   4. What is your medication issue? Patient was placed on Eliquis at last visit, 12/27 and since then feet has been swelling. Please call to discuss.

## 2018-04-26 ENCOUNTER — Telehealth: Payer: Self-pay

## 2018-04-26 NOTE — Telephone Encounter (Signed)
Debbora Presto [phone] from [facility] called regarding patient's edema in lower extremities had pacemaker at 12/19. He is taking Eliquis for Afib and has 2+ ankle edema has done test which shows moderate Left side PAD. Informed Hayes Ludwig that patient needs to follow up with cardiology as per Dr. Raliegh Ip.

## 2018-04-27 ENCOUNTER — Encounter: Payer: Self-pay | Admitting: Nurse Practitioner

## 2018-04-27 ENCOUNTER — Ambulatory Visit: Payer: Medicare Other | Admitting: Nurse Practitioner

## 2018-04-27 VITALS — BP 159/85 | HR 72 | Ht 68.0 in | Wt 177.0 lb

## 2018-04-27 DIAGNOSIS — I4892 Unspecified atrial flutter: Secondary | ICD-10-CM | POA: Diagnosis not present

## 2018-04-27 DIAGNOSIS — I10 Essential (primary) hypertension: Secondary | ICD-10-CM | POA: Diagnosis not present

## 2018-04-27 DIAGNOSIS — I5031 Acute diastolic (congestive) heart failure: Secondary | ICD-10-CM | POA: Diagnosis not present

## 2018-04-27 MED ORDER — FUROSEMIDE 20 MG PO TABS: 20.0000 mg | ORAL_TABLET | Freq: Every day | ORAL | 3 refills | Status: DC

## 2018-04-27 NOTE — Progress Notes (Signed)
Office Visit    Patient Name: Clinton Joseph Date of Encounter: 04/27/2018  Primary Care Provider:  Olin Hauser, DO Primary Cardiologist:  No primary care provider on file.  Chief Complaint    11 eight-year hypertension, nonobstructive CAD, Mobitz 2 heart block with syncope status post permanent pacemaker, lymphoma, and recent diagnosis of atrial flutter, who presents for follow-up related to similar extremity swelling.  Past Medical History    Past Medical History:  Diagnosis Date  . Anxiety state, unspecified   . Arthritis    a. Hands.  . Atrial flutter ([place])    a. Dx 03/2018-->Eliquis 5 BID started (CHA2DS2VASc = 5).  . Diffuse large B cell lymphoma ([place])    a. 2007 s/p chemo - followed by Dr. Jeb Levering.  . H/O echocardiogram    a. 11/2013 Echo: EF 55-60%, mildly dil LA, mild to mod Ao sclerosis.  . Hyperlipidemia   . Hypertensive heart disease   . Mobitz II    a. 11/2013 syncope-->s/p SJM Assurity DR model [id number] (ser # [id number]).  . Non-obstructive CAD    a. 10/2010 Cath: >M nl, LAD/LCX min irregs, RCA 40p, EF 60%.  . Tremor    a. right hand  . Urticaria, unspecified   . Ventricular tachycardia (HCC)    a. 2012->broke with amio, neg w/u.   Past Surgical History:  Procedure Laterality Date  . APPENDECTOMY  1948  . CARDIAC CATHETERIZATION  11/17/2010   [facility]- LAD min irregs, LCX min irregs, RCA 40p, EF 60%.   Marland Kitchen CATARACT EXTRACTION, BILATERAL Bilateral ~ 2012  . INSERT / REPLACE / REMOVE PACEMAKER  12/05/2013  . PERMANENT PACEMAKER INSERTION N/A 12/05/2013   Procedure: PERMANENT PACEMAKER INSERTION;  Surgeon: Coralyn Mark, MD;  Location: [place] CATH LAB;  Service: Cardiovascular;  Laterality: N/A;    Allergies  Allergies  Allergen Reactions  . Keflex [Cephalexin]     Diarrhea   . Penicillins Hives and Rash  . Tape Rash    History of Present Illness    85 year old male with the above past medical history including nonobstructive CAD status post  catheterization in July 2012, Mobitz 2 heart block with syncope status post permanent pacemaker in August 2015, hypertension, hyperlipidemia, and lymphoma.  He was recently noted by remote device monitoring to have elevated atrial rates consistent with atrial fib/flutter and he saw Dr. Rockey Situ on December 27.  Patient was asymptomatic and was not atrial flutter that day.  His rate was well controlled.  In the setting of a CHA2DS2VASc of 4, he was placed on Eliquis 5 mg twice daily.  Basic metabolic panel was evaluated that day and was stable.  Over the past 2 weeks, he has noted an increase in lower extremity swelling.  He is relatively inactive and is not aware of any change in exercise tolerance.  He denies chest pain, dyspnea, palpitations, PND, orthopnea, dizziness, syncope, or early satiety.  He does not routinely weigh himself and also is not careful with his salt intake.  He initially thought that perhaps Eliquis was contributing to the lower extremity swelling.  Home Medications    Prior to Admission medications   Medication Sig Start Date End Date Taking? Authorizing Provider  apixaban (ELIQUIS) 5 MG TABS tablet Take 1 tablet (5 mg total) by mouth 2 (two) times daily. 04/15/18  Yes Minna Merritts, MD  cetirizine (ZYRTEC) 10 MG tablet Take 10 mg by mouth daily.   Yes [provider]  fluticasone Asencion Islam)  50 MCG/ACT nasal spray Place 2 sprays into both nostrils daily as needed for allergies or rhinitis. 07/21/16  Yes Karamalegos, Devonne Doughty, DO  losartan (COZAAR) 25 MG tablet Take 1 tablet (25 mg total) by mouth daily. (take whole pill, do not cut in half) 06/09/17  Yes Karamalegos, Devonne Doughty, DO  Multiple Vitamins-Minerals (MULTIVITAMIN WITH MINERALS) tablet Take 1 tablet by mouth daily.    Yes [provider]  mupirocin ointment (BACTROBAN) 2 % Apply 1 application topically 2 (two) times daily. For 7-10 days 04/14/18  Yes Mikey College, NP  Omega-3 Fatty Acids  (FISH OIL) 1000 MG CAPS Take 1,200 mg by mouth 2 (two) times daily.    Yes [provider]  furosemide (LASIX) 20 MG tablet Take 1 tablet (20 mg total) by mouth daily. 04/27/18 07/26/18  Theora Gianotti, NP    Review of Systems    As above, increased lower extremity swelling.  He denies fatigue, chest pain, palpitations, dyspnea, dizziness, syncope, or early satiety.  All other systems reviewed and are otherwise negative except as noted above.  Physical Exam    VS:  BP (!) 159/85 (BP Location: Right Arm, Patient Position: Sitting, Cuff Size: Normal)   Pulse 72   Ht 5\' 8"  (1.727 m)   Wt 177 lb (80.3 kg)   BMI 26.91 kg/m  , BMI Body mass index is 26.91 kg/m. GEN: Well nourished, well developed, in no acute distress. HEENT: normal. Neck: Supple, JVP approximately 12 cm.  No carotid bruits, or masses. Cardiac: Irregular, 2/6 systolic murmur heard throughout, no rubs, or gallops. No clubbing, cyanosis.  3+ bilateral lower extremity edema.  Radials/PT 2+ and equal bilaterally.  Respiratory:  Respirations regular and unlabored, bibasilar crackles. GI: Soft, nontender, nondistended, BS + x 4. MS: no deformity or atrophy. Skin: warm and dry, no rash. Neuro:  Strength and sensation are intact. Psych: Normal affect.  Accessory Clinical Findings    ECG personally reviewed by me today -atrial flutter with variable conduction, PVC, 72, left axis deviation- no acute changes.  Lab Results  Component Value Date   CREATININE 1.29 (H) 04/15/2018   BUN 27 04/15/2018   NA 139 04/15/2018   K 4.8 04/15/2018   CL 102 04/15/2018   CO2 24 04/15/2018    Lab Results  Component Value Date   TSH 2.12 12/04/2013     Assessment & Plan    1.  Atrial flutter: This was initially noted on remote device interrogation and then confirmed by 12-lead EKG at office visit on December 27.  At that time, he was asymptomatic.  He was placed on Eliquis 5 mg twice daily and has been tolerating that  well.  Over the past 2 weeks, he has noticed increased lower extremity swelling.  His weight is up 2 pounds and he also has crackles on examination.  He initially thought this was secondary to Eliquis however, this is much more likely heart failure in the setting of loss of atrial kick.  I will add Lasix 20 mg daily today.  I will also arrange for follow-up echocardiogram.  He has follow-up with Dr. Caryl Comes in a week.  He will need a follow-up basic metabolic panel and TSH at that time, as that was not checked following diagnosis in December.  It was initially felt that rate control be most appropriate given lack of symptoms however, given development of heart failure symptoms, if pacer interrogation next week shows persistent atrial arrhythmias, he would benefit from  cardioversion.  We discussed the importance of daily weights, sodium restriction, medication compliance, and symptom reporting and he verbalizes understanding.   2.  Acute diastolic congestive heart failure: Normal LV function by echo in 2015.  In the setting of above, he has had increasing lower extremity swelling with 2 pound weight gain, and crackles on exam.  Adding Lasix as above.  He is hypertensive today and as I am adding Lasix, I will not adjust his losartan dose at this time.  He will need a follow-up basic metabolic panel in 1 week and I will arrange for an echo, hopefully to be performed between now and his next visit.  3.  Essential hypertension: See #2.  Adding Lasix today.  4.  Disposition: Patient has follow-up with electrophysiology next week and will require a follow-up basic metabolic panel, TSH, and CBC at that time.  Murray Hodgkins, NP 04/27/2018, 3:29 PM

## 2018-04-27 NOTE — Patient Instructions (Signed)
Medication Instructions:  Your physician has recommended you make the following change in your medication:  1- START Lasix 1 tablet (20 mg ) once daily  If you need a refill on your cardiac medications before your next appointment, please call your pharmacy.   Lab work: None ordered  If you have labs (blood work) drawn today and your tests are completely normal, you will receive your results only by: Marland Kitchen MyChart Message (if you have MyChart) OR . A paper copy in the mail If you have any lab test that is abnormal or we need to change your treatment, we will call you to review the results.  Testing/Procedures: 1- Echo Your physician has requested that you have an echocardiogram. Echocardiography is a painless test that uses sound waves to create images of your heart. It provides your doctor with information about the size and shape of your heart and how well your heart's chambers and valves are working. This procedure takes approximately one hour. There are no restrictions for this procedure.  Follow-Up: At [facility], you and your health needs are our priority.  As part of our continuing mission to provide you with exceptional heart care, we have created designated Provider Care Teams.  These Care Teams include your primary Cardiologist (physician) and Advanced Practice Providers (APPs -  Physician Assistants and Nurse Practitioners) who all work together to provide you with the care you need, when you need it. . Please follow up as scheduled with Dr. Caryl Comes next week

## 2018-04-27 NOTE — Telephone Encounter (Signed)
Patient's wife called and stated that the patient has had bilateral feet swelling since starting Eliquis 5 mg bid. She has been informed that this is not a typical reaction to eliquis but she is concerned.   The patient denies shortness of breath and stated the swelling gets worse overnight. She has been in touch with his PCP and they recommended that the patient be seen today by cardiology. An appointment has been made today at 2:30.

## 2018-05-03 ENCOUNTER — Encounter: Payer: Self-pay | Admitting: Internal Medicine

## 2018-05-03 ENCOUNTER — Telehealth: Payer: Self-pay | Admitting: Internal Medicine

## 2018-05-03 ENCOUNTER — Ambulatory Visit: Payer: Medicare Other | Admitting: Internal Medicine

## 2018-05-03 VITALS — BP 148/88 | HR 70 | Ht 68.0 in | Wt 175.0 lb

## 2018-05-03 DIAGNOSIS — Z01812 Encounter for preprocedural laboratory examination: Secondary | ICD-10-CM | POA: Diagnosis not present

## 2018-05-03 DIAGNOSIS — Z950 Presence of cardiac pacemaker: Secondary | ICD-10-CM

## 2018-05-03 DIAGNOSIS — I480 Paroxysmal atrial fibrillation: Secondary | ICD-10-CM

## 2018-05-03 DIAGNOSIS — I442 Atrioventricular block, complete: Secondary | ICD-10-CM

## 2018-05-03 LAB — CUP PACEART INCLINIC DEVICE CHECK
Battery Remaining Longevity: 108 mo
Battery Voltage: 3.01 V
Brady Statistic RA Percent Paced: 5.1 %
Brady Statistic RV Percent Paced: 96 %
Date Time Interrogation Session: 20200114161202
Implantable Lead Implant Date: 20150818
Implantable Lead Implant Date: 20150818
Implantable Lead Location: 753859
Implantable Lead Location: 753860
Implantable Lead Model: 1948
Implantable Pulse Generator Implant Date: 20150818
Lead Channel Impedance Value: 387.5 Ohm
Lead Channel Impedance Value: 725 Ohm
Lead Channel Pacing Threshold Amplitude: 0.5 V
Lead Channel Sensing Intrinsic Amplitude: 1.4 mV
Lead Channel Sensing Intrinsic Amplitude: 12 mV
Lead Channel Setting Pacing Amplitude: 2.5 V
Lead Channel Setting Pacing Pulse Width: 0.5 ms
Lead Channel Setting Sensing Sensitivity: 4 mV
MDC IDC MSMT LEADCHNL RV PACING THRESHOLD PULSEWIDTH: 0.5 ms
MDC IDC SET LEADCHNL RA PACING AMPLITUDE: 2 V
Pulse Gen Model: 2240
Pulse Gen Serial Number: 7664739

## 2018-05-03 NOTE — Telephone Encounter (Signed)
I called and spoke with the patient's wife to confirm his DCCV has been scheduled for Monday 05/09/18 at 8:00 am- he will need to arrive at 7:00 am at the [facility]  All other instructions were given to the patient in clinic earlier today.

## 2018-05-03 NOTE — Patient Instructions (Addendum)
Medication Instructions:  - Your physician has recommended you make the following change in your medication:   1) INCREASE lasix (furosemide) 20 mg- take 2 tablets (40 mg) by mouth daily x 3 days, then resume 1 tablet (20 mg) once daily  If you need a refill on your cardiac medications before your next appointment, please call your pharmacy.   Lab work: - Your physician recommends that you have lab work today: BMP/ CBC  If you have labs (blood work) drawn today and your tests are completely normal, you will receive your results only by: Marland Kitchen MyChart Message (if you have MyChart) OR . A paper copy in the mail If you have any lab test that is abnormal or we need to change your treatment, we will call you to review the results.  Testing/Procedures: - Your physician has recommended that you have a Cardioversion (DCCV). Electrical Cardioversion uses a jolt of electricity to your heart either through paddles or wired patches attached to your chest. This is a controlled, usually prescheduled, procedure. Defibrillation is done under light anesthesia in the hospital, and you usually go home the day of the procedure. This is done to get your heart back into a normal rhythm. You are not awake for the procedure.  You are scheduled for a Cardioversion on ________________ with Dr.___________ Please arrive at the [place] [facility] at _________ a.m. on the day of your procedure.  DIET INSTRUCTIONS:  Nothing to eat or drink after midnight the night before your procedure.       1) Labs:  today  2) Medications:  You may take all of your regular medications with enough water to get them down safely the morning of your procedure unless listed below:  - HOLD lasix (furosemide) the morning of your procedure   3) Must have a responsible person to drive you home.  4) Bring a current list of your medications and current insurance cards.    If you have any questions after you get home, please call the  office at [phone] Nira Conn, RN)   Follow-Up: At [facility], you and your health needs are our priority.  As part of our continuing mission to provide you with exceptional heart care, we have created designated Provider Care Teams.  These Care Teams include your primary Cardiologist (physician) and Advanced Practice Providers (APPs -  Physician Assistants and Nurse Practitioners) who all work together to provide you with the care you need, when you need it. . in 2-4 weeks with Dr. Rockey Situ APP (post Cardioversion)  Any Other Special Instructions Will Be Listed Below (If Applicable). - N/A

## 2018-05-03 NOTE — Progress Notes (Signed)
Patient Care Team: Olin Hauser, DO as PCP - General (Family Medicine) Minna Merritts, MD as Consulting Physician (Cardiology)   HPI  Clinton Joseph is a 85 y.o. male Seen in follow-up for pacemaker implanted 8/15 by Dr. Greggory Brandy for syncope and Mobitz 2 heart block w intermittent complete heart block and pauses of greater than 6 seconds.  DATE TEST EF   7/12 LHC   % RCA 40%  8/15 Echo   55-60 % Ao Sclerosis w/o stenosis         Noted 12/19 to have atrial flutter atypical>> referred to Dr Deidre Ala and ASA discontinued and started on [place] made not to pursue cardioversion   However, while not having much problems with palpitations is having increasing problems with shortness of breath.  There is also significant peripheral edema and nocturnal dyspnea.  Extremely anxious about going to the hospital.  Date Cr Hgb  12/19 1.29 13.3          Past Medical History:  Diagnosis Date  . Anxiety state, unspecified   . Arthritis    a. Hands.  . Atrial flutter ([place])    a. Dx 03/2018-->Eliquis 5 BID started (CHA2DS2VASc = 5).  . Diffuse large B cell lymphoma ([place])    a. 2007 s/p chemo - followed by Dr. Jeb Levering.  . H/O echocardiogram    a. 11/2013 Echo: EF 55-60%, mildly dil LA, mild to mod Ao sclerosis.  . Hyperlipidemia   . Hypertensive heart disease   . Mobitz II    a. 11/2013 syncope-->s/p SJM Assurity DR model [id number] (ser # [id number]).  . Non-obstructive CAD    a. 10/2010 Cath: >M nl, LAD/LCX min irregs, RCA 40p, EF 60%.  . Tremor    a. right hand  . Urticaria, unspecified   . Ventricular tachycardia (HCC)    a. 2012->broke with amio, neg w/u.    Past Surgical History:  Procedure Laterality Date  . APPENDECTOMY  1948  . CARDIAC CATHETERIZATION  11/17/2010   [facility]- LAD min irregs, LCX min irregs, RCA 40p, EF 60%.   Marland Kitchen CATARACT EXTRACTION, BILATERAL Bilateral ~ 2012  . INSERT / REPLACE / REMOVE PACEMAKER  12/05/2013  . PERMANENT PACEMAKER INSERTION  N/A 12/05/2013   Procedure: PERMANENT PACEMAKER INSERTION;  Surgeon: Coralyn Mark, MD;  Location: [place] CATH LAB;  Service: Cardiovascular;  Laterality: N/A;    Current Outpatient Medications  Medication Sig Dispense Refill  . apixaban (ELIQUIS) 5 MG TABS tablet Take 1 tablet (5 mg total) by mouth 2 (two) times daily. 60 tablet 6  . cetirizine (ZYRTEC) 10 MG tablet Take 10 mg by mouth daily.    . fluticasone (FLONASE) 50 MCG/ACT nasal spray Place 2 sprays into both nostrils daily as needed for allergies or rhinitis. 48 g 3  . furosemide (LASIX) 20 MG tablet Take 1 tablet (20 mg total) by mouth daily. 90 tablet 3  . losartan (COZAAR) 25 MG tablet Take 1 tablet (25 mg total) by mouth daily. (take whole pill, do not cut in half) 90 tablet 3  . Multiple Vitamins-Minerals (MULTIVITAMIN WITH MINERALS) tablet Take 1 tablet by mouth daily.     . mupirocin ointment (BACTROBAN) 2 % Apply 1 application topically 2 (two) times daily. For 7-10 days 22 g 1  . Omega-3 Fatty Acids (FISH OIL) 1000 MG CAPS Take 1,200 mg by mouth 2 (two) times daily.      No current facility-administered medications for this visit.  Allergies  Allergen Reactions  . Keflex [Cephalexin]     Diarrhea   . Penicillins Hives and Rash  . Tape Rash    Review of Systems negative except from HPI and PMH  Physical Exam BP (!) 148/88 (BP Location: Left Arm, Patient Position: Sitting, Cuff Size: Normal)   Pulse 70   Ht 5\' 8"  (1.727 m)   Wt 175 lb (79.4 kg)   BMI 26.61 kg/m  Well developed and nourished in no acute distress HENT normal Neck supple with JVP 8-10 cm  clear Regular rate and rhythm, no murmurs or gallops Abd-soft with active BS No Clubbing cyanosis 2+ edema Skin-warm and dry A & Oriented  Grossly normal sensory and motor function    ECG atrial fibrillation with underlying ventricular pacing at 70   Assessment and  Plan  Complete heart block now with some intrinsic conduction with prolonged PR interval  and bifascicular block  Pacemaker-[employer] The patient's device was interrogated.  The information was reviewed. No changes were made in the programming.     Syncope     SCAF  Hypertension  BP at home 130s  Atrial tachycardia/fibrillation  Congestive heart failure acute/chronic-diastolic    Now with persistent atrial fibrillation.  He is on anticoagulation.  He has not missed any doses.  With his symptoms of progressive heart failure and significant volume overload we will undertake cardioversion.    Maintaining of AV synchrony may help with heart failure status; however, with a significant volume overload we will increase his furosemide from 20--40 as he former dose is having little impact on diuresis.  He has been extremely anxious about coming to hospital.  He is willing to come in for cardioversion.  We spent more than 50% of our >25 min visit in face to face counseling regarding the above

## 2018-05-04 LAB — CBC WITH DIFFERENTIAL/PLATELET
BASOS: 1 %
Basophils Absolute: 0 10*3/uL (ref 0.0–0.2)
EOS (ABSOLUTE): 0.1 10*3/uL (ref 0.0–0.4)
EOS: 1 %
HEMOGLOBIN: 12.5 g/dL — AB (ref 13.0–17.7)
Hematocrit: 35.3 % — ABNORMAL LOW (ref 37.5–51.0)
IMMATURE GRANS (ABS): 0 10*3/uL (ref 0.0–0.1)
Immature Granulocytes: 0 %
LYMPHS: 17 %
Lymphocytes Absolute: 1.1 10*3/uL (ref 0.7–3.1)
MCH: 32.7 pg (ref 26.6–33.0)
MCHC: 35.4 g/dL (ref 31.5–35.7)
MCV: 92 fL (ref 79–97)
Monocytes Absolute: 0.5 10*3/uL (ref 0.1–0.9)
Monocytes: 8 %
NEUTROS ABS: 5 10*3/uL (ref 1.4–7.0)
Neutrophils: 73 %
Platelets: 183 10*3/uL (ref 150–450)
RBC: 3.82 x10E6/uL — ABNORMAL LOW (ref 4.14–5.80)
RDW: 12.8 % (ref 11.6–15.4)
WBC: 6.8 10*3/uL (ref 3.4–10.8)

## 2018-05-04 LAB — BASIC METABOLIC PANEL
BUN/Creatinine Ratio: 20 (ref 10–24)
BUN: 27 mg/dL (ref 8–27)
CALCIUM: 10 mg/dL (ref 8.6–10.2)
CO2: 26 mmol/L (ref 20–29)
Chloride: 100 mmol/L (ref 96–106)
Creatinine, Ser: 1.36 mg/dL — ABNORMAL HIGH (ref 0.76–1.27)
GFR, EST AFRICAN AMERICAN: 53 mL/min/{1.73_m2} — AB (ref >59)
GFR, EST NON AFRICAN AMERICAN: 46 mL/min/{1.73_m2} — AB (ref >59)
Glucose: 105 mg/dL — ABNORMAL HIGH (ref 65–99)
Potassium: 5 mmol/L (ref 3.5–5.2)
Sodium: 142 mmol/L (ref 134–144)

## 2018-05-06 LAB — CUP PACEART REMOTE DEVICE CHECK
Battery Remaining Longevity: 122 mo
Battery Remaining Percentage: 95.5 %
Battery Voltage: 3.01 V
Brady Statistic AP VS Percent: 2.2 %
Brady Statistic AS VP Percent: 88 %
Brady Statistic AS VS Percent: 2.9 %
Brady Statistic RA Percent Paced: 8.4 %
Brady Statistic RV Percent Paced: 95 %
Date Time Interrogation Session: 20191127073239
Implantable Lead Implant Date: 20150818
Implantable Lead Implant Date: 20150818
Implantable Lead Location: 753859
Implantable Lead Location: 753860
Implantable Lead Model: 1948
Implantable Pulse Generator Implant Date: 20150818
Lead Channel Impedance Value: 380 Ohm
Lead Channel Pacing Threshold Amplitude: 0.5 V
Lead Channel Pacing Threshold Pulse Width: 0.5 ms
Lead Channel Pacing Threshold Pulse Width: 0.5 ms
Lead Channel Sensing Intrinsic Amplitude: 12 mV
Lead Channel Sensing Intrinsic Amplitude: 5 mV
Lead Channel Setting Pacing Amplitude: 0.75 V
Lead Channel Setting Pacing Amplitude: 2 V
Lead Channel Setting Pacing Pulse Width: 0.5 ms
Lead Channel Setting Sensing Sensitivity: 4 mV
MDC IDC MSMT LEADCHNL RA PACING THRESHOLD AMPLITUDE: 0.5 V
MDC IDC MSMT LEADCHNL RV IMPEDANCE VALUE: 710 Ohm
MDC IDC STAT BRADY AP VP PERCENT: 6.7 %
Pulse Gen Model: 2240
Pulse Gen Serial Number: 7664739

## 2018-05-09 ENCOUNTER — Encounter
Admission: RE | Disposition: A | Discharge status: Home / Self Care | Payer: Self-pay | Source: Home / Self Care | Attending: Cardiovascular Disease

## 2018-05-09 ENCOUNTER — Ambulatory Visit: Payer: Medicare Other | Admitting: Anesthesiology

## 2018-05-09 ENCOUNTER — Ambulatory Visit
Admission: RE | Admit: 2018-05-09 | Discharge: 2018-05-09 | Disposition: A | Discharge status: Home / Self Care | Payer: Medicare Other | Attending: Cardiovascular Disease | Admitting: Cardiovascular Disease

## 2018-05-09 ENCOUNTER — Other Ambulatory Visit: Payer: Self-pay

## 2018-05-09 DIAGNOSIS — I5033 Acute on chronic diastolic (congestive) heart failure: Secondary | ICD-10-CM | POA: Diagnosis not present

## 2018-05-09 DIAGNOSIS — I129 Hypertensive chronic kidney disease with stage 1 through stage 4 chronic kidney disease, or unspecified chronic kidney disease: Secondary | ICD-10-CM | POA: Diagnosis not present

## 2018-05-09 DIAGNOSIS — I471 Supraventricular tachycardia: Secondary | ICD-10-CM | POA: Diagnosis not present

## 2018-05-09 DIAGNOSIS — I110 Hypertensive heart disease with heart failure: Secondary | ICD-10-CM | POA: Insufficient documentation

## 2018-05-09 DIAGNOSIS — I442 Atrioventricular block, complete: Secondary | ICD-10-CM | POA: Diagnosis not present

## 2018-05-09 DIAGNOSIS — I4819 Other persistent atrial fibrillation: Secondary | ICD-10-CM | POA: Diagnosis not present

## 2018-05-09 DIAGNOSIS — I4891 Unspecified atrial fibrillation: Secondary | ICD-10-CM | POA: Diagnosis not present

## 2018-05-09 DIAGNOSIS — I480 Paroxysmal atrial fibrillation: Secondary | ICD-10-CM

## 2018-05-09 DIAGNOSIS — Z8572 Personal history of non-Hodgkin lymphomas: Secondary | ICD-10-CM | POA: Diagnosis not present

## 2018-05-09 DIAGNOSIS — Z7901 Long term (current) use of anticoagulants: Secondary | ICD-10-CM | POA: Diagnosis not present

## 2018-05-09 DIAGNOSIS — Z79899 Other long term (current) drug therapy: Secondary | ICD-10-CM | POA: Diagnosis not present

## 2018-05-09 DIAGNOSIS — I4892 Unspecified atrial flutter: Secondary | ICD-10-CM | POA: Insufficient documentation

## 2018-05-09 DIAGNOSIS — N183 Chronic kidney disease, stage 3 (moderate): Secondary | ICD-10-CM | POA: Diagnosis not present

## 2018-05-09 DIAGNOSIS — E785 Hyperlipidemia, unspecified: Secondary | ICD-10-CM | POA: Diagnosis not present

## 2018-05-09 DIAGNOSIS — Z7951 Long term (current) use of inhaled steroids: Secondary | ICD-10-CM | POA: Diagnosis not present

## 2018-05-09 DIAGNOSIS — Z950 Presence of cardiac pacemaker: Secondary | ICD-10-CM | POA: Diagnosis not present

## 2018-05-09 DIAGNOSIS — I2510 Atherosclerotic heart disease of native coronary artery without angina pectoris: Secondary | ICD-10-CM | POA: Insufficient documentation

## 2018-05-09 DIAGNOSIS — Z9221 Personal history of antineoplastic chemotherapy: Secondary | ICD-10-CM | POA: Insufficient documentation

## 2018-05-09 HISTORY — PX: CARDIOVERSION: EP1203

## 2018-05-09 SURGERY — CARDIOVERSION (CATH LAB)
Anesthesia: General

## 2018-05-09 MED ORDER — LIDOCAINE HCL (PF) 1 % IJ SOLN
INTRAMUSCULAR | Status: AC
  Filled 2018-05-09: qty 30

## 2018-05-09 MED ORDER — METOPROLOL TARTRATE 5 MG/5ML IV SOLN
INTRAVENOUS | Status: DC | PRN
  Administered 2018-05-09: 5 mg via INTRAVENOUS

## 2018-05-09 MED ORDER — METOPROLOL TARTRATE 5 MG/5ML IV SOLN
INTRAVENOUS | Status: AC
  Filled 2018-05-09: qty 5

## 2018-05-09 MED ORDER — PROPOFOL 10 MG/ML IV BOLUS
INTRAVENOUS | Status: DC | PRN
  Administered 2018-05-09: 30 mg via INTRAVENOUS

## 2018-05-09 MED ORDER — SODIUM CHLORIDE 0.9 % IV SOLN
INTRAVENOUS | Status: DC
  Administered 2018-05-09: 08:00:00 via INTRAVENOUS

## 2018-05-09 NOTE — Anesthesia Post-op Follow-up Note (Signed)
Anesthesia QCDR form completed.        

## 2018-05-09 NOTE — Transfer of Care (Signed)
Immediate Anesthesia Transfer of Care Note  Patient: Clinton Joseph  Procedure(s) Performed: CARDIOVERSION (CATH LAB) (N/A )  Patient Location: PACU  Anesthesia Type:General  Level of Consciousness: awake alert oriented  Airway & Oxygen Therapy: Patient Spontanous Breathing and Patient connected to nasal cannula oxygen  Post-op Assessment: Report given to RN and Post -op Vital signs reviewed and stable  Post vital signs: Reviewed and stable  Last Vitals:  Vitals Value Taken Time  BP 128/77 05/09/2018  8:37 AM  Temp    Pulse 79 05/09/2018  8:37 AM  Resp 16 05/09/2018  8:37 AM  SpO2 98 % 05/09/2018  8:37 AM  Vitals shown include unvalidated device data.  Last Pain:  Vitals:   05/09/18 0837  TempSrc:   PainSc: 0-No pain         Complications: No apparent anesthesia complications

## 2018-05-09 NOTE — Anesthesia Postprocedure Evaluation (Signed)
Anesthesia Post Note  Patient: Clinton Joseph  Procedure(s) Performed: CARDIOVERSION (CATH LAB) (N/A )  Patient location during evaluation: PACU Anesthesia Type: General Level of consciousness: awake and alert and oriented Pain management: pain level controlled Vital Signs Assessment: post-procedure vital signs reviewed and stable Respiratory status: spontaneous breathing Cardiovascular status: blood pressure returned to baseline Anesthetic complications: no     Last Vitals:  Vitals:   05/09/18 0900 05/09/18 0915  BP:  126/73  Pulse:  76  Resp: 16 15  Temp:    SpO2:  98%    Last Pain:  Vitals:   05/09/18 0915  TempSrc:   PainSc: 0-No pain                 Migel Hannis

## 2018-05-09 NOTE — CV Procedure (Addendum)
Cardioversion procedure note For atrial fibrillation, persistent  Procedure Details:  Consent: Risks of procedure as well as the alternatives and risks of each were explained to the (patient/caregiver). Consent for procedure obtained.  Time Out: Verified patient identification, verified procedure, site/side was marked, verified correct patient position, special equipment/implants available, medications/allergies/relevent history reviewed, required imaging and test results available. Performed  Patient placed on cardiac monitor, pulse oximetry, supplemental oxygen as necessary.  Sedation given: propofol IV, Dr.  Gomez Cleverly pads placed anterior and posterior chest.   Cardioverted 1 time(s).  Cardioverted at  150 J. Synchronized biphasic Converted to NSR (sinus tachycardia rate 100 bpm, metoprolol 5 mg IV x 1 given, rate down to 75 bpm)   Evaluation: Findings: Post procedure EKG shows: NSR Complications: None Patient did tolerate procedure well.  Time Spent Directly with the Patient:  28 minutes   Esmond Plants, M.D., Ph.D.

## 2018-05-09 NOTE — Anesthesia Preprocedure Evaluation (Signed)
Anesthesia Evaluation  Patient identified by MRN, date of birth, ID band Patient awake    Reviewed: Allergy & Precautions, NPO status , Patient's Chart, lab work & pertinent test results  Airway Mallampati: III       Dental   Pulmonary    Pulmonary exam normal        Cardiovascular hypertension, + angina + CAD  + dysrhythmias Atrial Fibrillation + pacemaker      Neuro/Psych    GI/Hepatic negative GI ROS, Neg liver ROS,   Endo/Other  negative endocrine ROS  Renal/GU Renal InsufficiencyRenal disease  negative genitourinary   Musculoskeletal  (+) Arthritis , Osteoarthritis,    Abdominal   Peds negative pediatric ROS (+)  Hematology   Anesthesia Other Findings   Reproductive/Obstetrics                             Anesthesia Physical Anesthesia Plan  ASA: IV  Anesthesia Plan: General   Post-op Pain Management:    Induction: Intravenous  PONV Risk Score and Plan:   Airway Management Planned: Nasal Cannula  Additional Equipment:   Intra-op Plan:   Post-operative Plan:   Informed Consent: I have reviewed the patients History and Physical, chart, labs and discussed the procedure including the risks, benefits and alternatives for the proposed anesthesia with the patient or authorized representative who has indicated his/her understanding and acceptance.     Dental advisory given  Plan Discussed with: CRNA and Surgeon  Anesthesia Plan Comments:         Anesthesia Quick Evaluation

## 2018-05-10 NOTE — H&P (Signed)
H&P Addendum, pre-cardioversion  Patient was seen and evaluated prior to -cardioversion procedure Symptoms, prior testing details again confirmed with the patient Patient examined, no significant change from prior exam Lab work reviewed in detail personally by myself Patient understands risk and benefit of the procedure, willing to proceed  Signed, Tim Gollan, MD, Ph.D [facility]  

## 2018-05-18 ENCOUNTER — Ambulatory Visit: Payer: Medicare Other | Admitting: Family Medicine

## 2018-05-19 ENCOUNTER — Other Ambulatory Visit: Payer: Self-pay | Admitting: Family Medicine

## 2018-05-19 DIAGNOSIS — I10 Essential (primary) hypertension: Secondary | ICD-10-CM

## 2018-05-25 ENCOUNTER — Ambulatory Visit: Payer: Medicare Other | Admitting: Cardiovascular Disease

## 2018-05-26 ENCOUNTER — Encounter: Payer: Self-pay | Admitting: *Deleted

## 2018-05-26 ENCOUNTER — Ambulatory Visit (INDEPENDENT_AMBULATORY_CARE_PROVIDER_SITE_OTHER): Payer: Medicare Other

## 2018-05-26 ENCOUNTER — Telehealth (INDEPENDENT_AMBULATORY_CARE_PROVIDER_SITE_OTHER): Payer: Medicare Other | Admitting: Cardiovascular Disease

## 2018-05-26 DIAGNOSIS — I5031 Acute diastolic (congestive) heart failure: Secondary | ICD-10-CM | POA: Diagnosis not present

## 2018-05-26 DIAGNOSIS — I452 Bifascicular block: Secondary | ICD-10-CM | POA: Diagnosis not present

## 2018-05-26 MED ORDER — AMIODARONE HCL 200 MG PO TABS: ORAL_TABLET | ORAL | 0 refills | Status: DC

## 2018-05-26 MED ORDER — CARVEDILOL 6.25 MG PO TABS: 6.2500 mg | ORAL_TABLET | Freq: Two times a day (BID) | ORAL | 3 refills | Status: DC

## 2018-05-26 MED ORDER — LOSARTAN POTASSIUM 50 MG PO TABS: 50.0000 mg | ORAL_TABLET | Freq: Every day | ORAL | 3 refills | Status: DC

## 2018-05-26 NOTE — Telephone Encounter (Signed)
Spoke with patient and then his wife per release form. Reviewed all appointments and medications changes. Also advised that at appointment they will give him the instructions for his upcoming cardioversion. She verbalized understanding, read back all information provided, and she had no further questions at this time. Provided her with number to call back if she has any questions.

## 2018-05-26 NOTE — Telephone Encounter (Signed)
Patient in today for echocardiogram and was seen briefly by provider. EKG obtained and Dr. Donivan Scull recommendations:  1. Appointment for Tuesday with Dr. Caryl Comes for Device Setting change parameters at 08:30AM 05/31/2018 2. START Carvedilol 6.25 mg twice a day 3. START Amiodarone 400 mg twice a day for 7 days then decrease to 200 mg twice a day. 4. INCREASE Losartan to 50 mg once daily 5. Nurse visit for EKG & labs prior to cardioversion to verify if still in arrhythmia. Scheduled 06/14/2018 at 10:00 AM here in our office. 6. Schedule DCCV for 2-3 weeks out. Scheduled DCCV 06/15/2018 at 08:00 AM with arrival time of 07:00AM with Dr. Rockey Situ at [facility] and second case add on was approved by Jackelyn Poling and scheduled with Marcie Bal. Will forward note to providers and nurse covering for appointment next week to provide him with further DCCV instructions and appointments.

## 2018-05-26 NOTE — Progress Notes (Unsigned)
An echocardiogram was performed on this patient today. Given his diminished LVEF and arrhythmia, Dr Rockey Situ was called in to the exam room to look at the images and speak to the patient. The patient denied all symptoms and said he felt exceptionally well. An ECG was performed and Dr. Rockey Situ said the patient and his wife could leave.  Patient ID: Clinton Joseph, male    DOB: [birth year], 85 y.o.   MRN: [id number]  HPI    Review of Systems    Physical Exam

## 2018-05-30 NOTE — Progress Notes (Deleted)
Cardiology Office Note Date:  05/30/2018  Patient ID:  Clinton Joseph, Clinton Joseph May 24, 1927, MRN [id number] PCP:  Olin Hauser, DO  Cardiologist:  Dr. Rockey Situ, MD Electrophysiologist: Dr. Caryl Comes, MD  ***refresh   Chief Complaint: Follow-up  History of Present Illness: Clinton Joseph is a 85 y.o. male with history of nonobstructive CAD, Mobitz type II heart block with syncope status post PPM, atrial flutter/fib diagnosed in 03/2018 on Eliquis status post briefly successful DCCV in 05/7060, chronic systolic CHF, lymphoma, hypertension, and hyperlipidemia who presents for follow-up of his A. fib/flutter and systolic CHF.  Patient underwent cardiac cath in 10/2010 that demonstrated nonobstructive CAD.  He was noted to have Mobitz type II heart block with syncope in 11/2013 and underwent successful PPM implantation at that time.  Echo at that time showed an EF of 55 to 60%, mildly dilated left atrium, mild to moderate aortic valve sclerosis without stenosis.  He was noted in late 2019 to have elevated atrial rates by remote device monitoring consistent with A. fib/flutter.  He was seen by his primary cardiologist on 04/15/2018 and was noted to be asymptomatic and not in atrial flutter that day.  His rate was well controlled.  In the setting of a CHADS2VASc of 4, he was placed on Eliquis 5 mg twice daily.  He was seen in the office on 04/27/2018 and noted a 2-week history of increase in lower extremity swelling.  He was noted to be relatively sedentary at baseline and had not noted any change in his exercise tolerance.  He reported no regular weights and did not monitor his salt intake.  At that visit, he had a weight of 177 pounds.  This was up 2 pounds when compared to his 04/15/2018 visit.  EKG showed atrial flutter with variable conduction, 72 bpm, PVCs.  It was felt to be holding onto fluid in the setting of atrial flutter with loss of atrial kick.  In that setting, he was started on  Lasix 20 mg daily.  He followed up with EP on 05/03/2018 with a weight of 175 pounds.  Device interrogation showed persistent atrial fibrillation.  He continued to show signs of significant volume overload.  His Lasix was increased to 40 mg daily.  He underwent successful cardioversion on 05/09/2018 with conversion to sinus tachycardia with a rate of 100 bpm that improved to 75 bpm following metoprolol 5 mg IV x1.  Echo was performed on 05/26/2018 which showed a severely reduced LV systolic function with an EF of less than 20%, mildly dilated LV, global hypokinesis, mildly reduced RV systolic function with mildly enlarged RV cavity, mildly elevated RVSP of 56.6 mmHg, moderately dilated left atrium, mildly dilated right atrium, moderate to severe mitral regurgitation, moderate to severe tricuspid regurgitation.  It appears the patient was possibly back in A. fib/flutter with RVR at the time of his echo.  Patient was reportedly asymptomatic per notes.  He was started on Coreg 6.25 mg twice daily as well as amiodarone 400 mg twice daily for 7 days then 200 mg twice daily thereafter.  His losartan was increased to 50 mg daily.  Repeat cardioversion, following amiodarone loading, was scheduled for 06/15/2018.  Labs: 04/2018 - potassium 5.0, serum creatinine 1.36, hemoglobin 12.5 06/2017 - LDL 135, A1c 5.9, LFT normal  ***  Past Medical History:  Diagnosis Date  . Anxiety state, unspecified   . Arthritis    a. Hands.  . Atrial flutter ([place])    a. Dx 03/2018-->Eliquis  5 BID started (CHA2DS2VASc = 5).  . Diffuse large B cell lymphoma ([place])    a. 2007 s/p chemo - followed by Dr. Jeb Levering.  . H/O echocardiogram    a. 11/2013 Echo: EF 55-60%, mildly dil LA, mild to mod Ao sclerosis.  . Hyperlipidemia   . Hypertensive heart disease   . Mobitz II    a. 11/2013 syncope-->s/p SJM Assurity DR model V7204091 (ser # [id number]).  . Non-obstructive CAD    a. 10/2010 Cath: >M nl, LAD/LCX min irregs, RCA 40p, EF 60%.  . Tremor      a. right hand  . Urticaria, unspecified   . Ventricular tachycardia (HCC)    a. 2012->broke with amio, neg w/u.    Past Surgical History:  Procedure Laterality Date  . APPENDECTOMY  1948  . CARDIAC CATHETERIZATION  11/17/2010   [facility]- LAD min irregs, LCX min irregs, RCA 40p, EF 60%.   . CARDIOVERSION N/A 05/09/2018   Procedure: CARDIOVERSION (CATH LAB);  Surgeon: Minna Merritts, MD;  Location: [facility] ORS;  Service: Cardiovascular;  Laterality: N/A;  . CATARACT EXTRACTION, BILATERAL Bilateral ~ 2012  . INSERT / REPLACE / REMOVE PACEMAKER  12/05/2013  . PERMANENT PACEMAKER INSERTION N/A 12/05/2013   Procedure: PERMANENT PACEMAKER INSERTION;  Surgeon: Coralyn Mark, MD;  Location: [facility] CATH LAB;  Service: Cardiovascular;  Laterality: N/A;    No outpatient medications have been marked as taking for the 05/31/18 encounter (Appointment) with Rise Mu, PA-C.    Allergies:   Keflex [cephalexin]; Penicillins; and Tape   Social History:  The patient  reports that he has never smoked. He has never used smokeless tobacco. He reports current alcohol use of about 2.0 standard drinks of alcohol per week. He reports that he does not use drugs.   Family History:  The patient's family history includes Coronary artery disease in his mother; Emphysema in his father.  ROS:   ROS   PHYSICAL EXAM: *** VS:  There were no vitals taken for this visit. BMI: There is no height or weight on file to calculate BMI.  Physical Exam   EKG:  Was ordered and interpreted by me today. Shows ***  Recent Labs: 06/23/2017: ALT 29 05/03/2018: BUN 27; Creatinine, Ser 1.36; Hemoglobin 12.5; Platelets 183; Potassium 5.0; Sodium 142  06/23/2017: Cholesterol 208; HDL 43; LDL Cholesterol (Calc) 135; Total CHOL/HDL Ratio 4.8; Triglycerides 166   CrCl cannot be calculated (Patient's most recent lab result is older than the maximum 21 days allowed.).   Wt Readings from Last 3 Encounters:  05/09/18 175 lb (79.4 kg)   05/03/18 175 lb (79.4 kg)  04/27/18 177 lb (80.3 kg)     Other studies reviewed: Additional studies/records reviewed today include: summarized above  ASSESSMENT AND PLAN:  1. ***  Disposition: F/u with Dr. Rockey Situ or an APP in ***. Follow up with EP as directed.   Current medicines are reviewed at length with the patient today.  The patient did not have any concerns regarding medicines.  Signed, Christell Faith, PA-C 05/30/2018 8:35 AM     [facility] [address] [place] [address] [place], [place] [address] [phone]

## 2018-05-30 NOTE — Progress Notes (Signed)
Patient Care Team: Olin Hauser, DO as PCP - General (Family Medicine) Minna Merritts, MD as Consulting Physician (Cardiology)   HPI  Clinton Joseph is a 85 y.o. male Seen in follow-up for pacemaker implanted 8/15 by Dr. Greggory Brandy for syncope and Mobitz 2 heart block and intermittent complete heart block and pauses of greater than 6 seconds.  Noted 12/19 to have atrial flutter atypical>>  started on apixoban  Decision made not to pursue cardioversion   Seen 1/20 agreeable to undertake DCCV>>NSR  Seen for echo See Below ECG Personally reviewed -- not quite sure with clear p waves and pacing > 100 suggestive of sinus tach with upper rate wenckebach behaviour but could be a tach Started amio, carvedilol and repeat cardioversion scheduled.    Date Cr K   Hgb  12/19 1.29    13.3             Date Cr K TSH LFTs Hgb  12/19 1.29     13.3  1/20 1.36 5.0          DATE TEST EF   2012 LHC   No obstructive CAD  8/15 Echo   55-60 % Ao Sclerosis  2/20 Echo   20% RV dysfn;  MR mod-sev; TR mod-sev     He feels much improved following the initiation of amiodarone which was associated with conversion to sinus rhythm.  He is now AV pacing.  Denies shortness of breath.  Still significantly volume overloaded.  No chest pain.   Past Medical History:  Diagnosis Date  . Anxiety state, unspecified   . Arthritis    a. Hands.  . Atrial flutter ([place])    a. Dx 03/2018-->Eliquis 5 BID started (CHA2DS2VASc = 5).  . Diffuse large B cell lymphoma ([place])    a. 2007 s/p chemo - followed by Dr. Jeb Levering.  . H/O echocardiogram    a. 11/2013 Echo: EF 55-60%, mildly dil LA, mild to mod Ao sclerosis.  . Hyperlipidemia   . Hypertensive heart disease   . Mobitz II    a. 11/2013 syncope-->s/p SJM Assurity DR model [id number] (ser # [id number]).  . Non-obstructive CAD    a. 10/2010 Cath: >M nl, LAD/LCX min irregs, RCA 40p, EF 60%.  . Tremor    a. right hand  . Urticaria, unspecified   .  Ventricular tachycardia (HCC)    a. 2012->broke with amio, neg w/u.    Past Surgical History:  Procedure Laterality Date  . APPENDECTOMY  1948  . CARDIAC CATHETERIZATION  11/17/2010   [facility]- LAD min irregs, LCX min irregs, RCA 40p, EF 60%.   . CARDIOVERSION N/A 05/09/2018   Procedure: CARDIOVERSION (CATH LAB);  Surgeon: Minna Merritts, MD;  Location: [facility] ORS;  Service: Cardiovascular;  Laterality: N/A;  . CATARACT EXTRACTION, BILATERAL Bilateral ~ 2012  . INSERT / REPLACE / REMOVE PACEMAKER  12/05/2013  . PERMANENT PACEMAKER INSERTION N/A 12/05/2013   Procedure: PERMANENT PACEMAKER INSERTION;  Surgeon: Coralyn Mark, MD;  Location: [facility] CATH LAB;  Service: Cardiovascular;  Laterality: N/A;    Current Outpatient Medications  Medication Sig Dispense Refill  . acetaminophen (TYLENOL) 650 MG CR tablet Take 1,300 mg by mouth daily.    Marland Kitchen amiodarone (PACERONE) 200 MG tablet Take 2 tablets (400 mg total) by mouth 2 (two) times daily for 7 days, THEN 1 tablet (200 mg total) 2 (two) times daily for 23 days. 74 tablet 0  . apixaban (ELIQUIS) 5  MG TABS tablet Take 1 tablet (5 mg total) by mouth 2 (two) times daily. 60 tablet 6  . carvedilol (COREG) 6.25 MG tablet Take 1 tablet (6.25 mg total) by mouth 2 (two) times daily. 180 tablet 3  . cetirizine (ZYRTEC) 10 MG tablet Take 10 mg by mouth daily.    . furosemide (LASIX) 20 MG tablet Take 1 tablet (20 mg total) by mouth daily. (Patient taking differently: Take 20 mg by mouth See admin instructions. Taking 2 tablets (40 mg) by mouth in the morning for 3 days, then decrease back to 1 tablet (20 mg) by mouth daily (resume regular dose on 05/06/2018)) 90 tablet 3  . losartan (COZAAR) 50 MG tablet Take 1 tablet (50 mg total) by mouth daily. 90 tablet 3  . Multiple Vitamins-Minerals (MULTIVITAMIN WITH MINERALS) tablet Take 1 tablet by mouth daily. Centrum Silver    . mupirocin ointment (BACTROBAN) 2 % Apply 1 application topically 2 (two) times daily. For 7-10  days 22 g 1  . Omega-3 Fatty Acids (FISH OIL) 1000 MG CAPS Take 1,000-2,000 mg by mouth See admin instructions. Take 2 capsules (2000 mg) by mouth in the morninig & 1 capsule (1000 mg) by mouth night.     No current facility-administered medications for this visit.     Allergies  Allergen Reactions  . Keflex [Cephalexin]     Diarrhea   . Penicillins Hives and Rash    Did it involve swelling of the face/tongue/throat, SOB, or low BP? No Did it involve sudden or severe rash/hives, skin peeling, or any reaction on the inside of your mouth or nose? Unknown Did you need to seek medical attention at a hospital or doctor's office? Unknown When did it last happen?40 years ago If all above answers are "NO", may proceed with cephalosporin use.   . Tape Rash    Review of Systems negative except from HPI and PMH  Physical Exam BP 132/80 (BP Location: Left Arm, Patient Position: Sitting, Cuff Size: Normal)   Pulse 61   Ht 5' 7.5" (1.715 m)   Wt 170 lb 12.8 oz (77.5 kg)   BMI 26.36 kg/m  Well developed and well nourished in no acute distress HENT normal Neck supple with JVP-8-10 Clear Device pocket well healed; without hematoma or erythema.  There is no tethering  Regular rate and rhythm, no  gallop No murmur Abd-soft with active BS No Clubbing cyanosis 2-3+ edema Skin-warm and dry A & Oriented  Grossly normal sensory and motor function    ECG AV pacing at 61 Intervals 35/17/49   Assessment and  Plan  Complete heart block now with some intrinsic conduction with prolonged PR interval and bifascicular block  Pacemaker-[employer] The patient's device was interrogated.  The information was reviewed. No changes were made in the programming.     Syncope     SCAF  Hypertension  BP at home 130s  Atrial tachycardia  Congestive heart failure-chronic/acute-systolic    He is volume overloaded.  We will increase his diuretic.  We will anticipate repeating his echo in about 6  weeks to see to what degree his LV function recovers as this would inform long-term modifications in neurohormonal regulation i.e. drug regulation.  He has borderline hyperkalemia; I do not think he would be a candidate for spironolactone.  He might well tolerate losartan--Entresto if necessary.  We will increase his Lasix from 20--40.  We will check in in a couple of days to see if he is  urinating more briskly.  We will check his amiodarone surveillance laboratories today as baseline  We spent more than 50% of our >25 min visit in face to face counseling regarding the above

## 2018-05-31 ENCOUNTER — Ambulatory Visit: Payer: Medicare Other | Admitting: Physician Assistant

## 2018-05-31 ENCOUNTER — Ambulatory Visit (INDEPENDENT_AMBULATORY_CARE_PROVIDER_SITE_OTHER): Payer: Medicare Other | Admitting: Internal Medicine

## 2018-05-31 ENCOUNTER — Encounter: Payer: Self-pay | Admitting: Internal Medicine

## 2018-05-31 VITALS — BP 132/80 | HR 61 | Ht 67.5 in | Wt 170.8 lb

## 2018-05-31 DIAGNOSIS — Z79899 Other long term (current) drug therapy: Secondary | ICD-10-CM

## 2018-05-31 DIAGNOSIS — I4892 Unspecified atrial flutter: Secondary | ICD-10-CM | POA: Diagnosis not present

## 2018-05-31 DIAGNOSIS — I442 Atrioventricular block, complete: Secondary | ICD-10-CM | POA: Diagnosis not present

## 2018-05-31 DIAGNOSIS — Z950 Presence of cardiac pacemaker: Secondary | ICD-10-CM | POA: Diagnosis not present

## 2018-05-31 DIAGNOSIS — I480 Paroxysmal atrial fibrillation: Secondary | ICD-10-CM | POA: Diagnosis not present

## 2018-05-31 NOTE — Patient Instructions (Signed)
Medication Instructions:  - Your physician has recommended you make the following change in your medication:   1) increase lasix (furosemide) 20 mg- take 2 tablets (40 mg) by mouth once daily x 5 days, then resume taking 1 tablet (20 mg) by mouth once daily  If you need a refill on your cardiac medications before your next appointment, please call your pharmacy.   Lab work: - Your physician recommends that you have lab work today: Liver/ TSH  If you have labs (blood work) drawn today and your tests are completely normal, you will receive your results only by: Marland Kitchen MyChart Message (if you have MyChart) OR . A paper copy in the mail If you have any lab test that is abnormal or we need to change your treatment, we will call you to review the results.  Testing/Procedures: -  We will cancel the cardioversion scheduled for 06/15/18  Follow-Up: At [facility], you and your health needs are our priority.  As part of our continuing mission to provide you with exceptional heart care, we have created designated Provider Care Teams.  These Care Teams include your primary Cardiologist (physician) and Advanced Practice Providers (APPs -  Physician Assistants and Nurse Practitioners) who all work together to provide you with the care you need, when you need it. . in 2-3 weeks with the PA/ NP  Any Other Special Instructions Will Be Listed Below (If Applicable). - N/A

## 2018-05-31 NOTE — Telephone Encounter (Signed)
Pt seen in clinic by Dr. Caryl Comes. See assoc OV note. No new orders needed at this time.

## 2018-06-01 LAB — TSH: TSH: 2.89 u[IU]/mL (ref 0.450–4.500)

## 2018-06-01 LAB — HEPATIC FUNCTION PANEL
ALBUMIN: 4.6 g/dL (ref 3.6–4.6)
ALT: 22 IU/L (ref 0–44)
AST: 34 IU/L (ref 0–40)
Alkaline Phosphatase: 60 IU/L (ref 39–117)
BILIRUBIN TOTAL: 0.9 mg/dL (ref 0.0–1.2)
Bilirubin, Direct: 0.29 mg/dL (ref 0.00–0.40)
Total Protein: 6.8 g/dL (ref 6.0–8.5)

## 2018-06-03 ENCOUNTER — Telehealth: Payer: Self-pay | Admitting: Physician Assistant

## 2018-06-03 NOTE — Telephone Encounter (Signed)
Spoke with patients wife per release form and she states that he has had some increased weakness. Reviewed that sometimes these new medications can cause similar symptoms but he is now on the lower dose amiodarone 200 mg twice a day. Reviewed that he should hopefully start feeling better now but to please call us back if this should persist or worsen. She verbalized understanding and had no further questions at this time.

## 2018-06-03 NOTE — Telephone Encounter (Signed)
Pt wife asks if any of pt heart medication can make him weak? Please call to discuss.

## 2018-06-06 ENCOUNTER — Telehealth: Payer: Self-pay | Admitting: Internal Medicine

## 2018-06-06 NOTE — Telephone Encounter (Addendum)
I called the patient to follow up on how his urine output was last week with the increased dose of lasix for 5 days. Per the patient, he did have good results with his urine output. He denies SOB today. I have advised that he check his weight about 3 times this week at least (he does not do this on a daily basis) to see how they are trending. I have advised him to keep his follow up appointment on 06/14/18 with Thurmond Butts, [place].   The patient voices understanding of the above and is agreeable.   To Dr. Caryl Comes as an [place] only.

## 2018-06-13 ENCOUNTER — Encounter: Payer: Self-pay | Admitting: Family Medicine

## 2018-06-13 ENCOUNTER — Telehealth: Payer: Self-pay | Admitting: Cardiovascular Disease

## 2018-06-13 ENCOUNTER — Ambulatory Visit (INDEPENDENT_AMBULATORY_CARE_PROVIDER_SITE_OTHER): Payer: Medicare Other | Admitting: Family Medicine

## 2018-06-13 ENCOUNTER — Other Ambulatory Visit: Payer: Self-pay | Admitting: Family Medicine

## 2018-06-13 ENCOUNTER — Other Ambulatory Visit: Payer: Self-pay

## 2018-06-13 VITALS — BP 138/62 | HR 63 | Resp 16 | Ht 67.5 in | Wt 169.0 lb

## 2018-06-13 DIAGNOSIS — E782 Mixed hyperlipidemia: Secondary | ICD-10-CM

## 2018-06-13 DIAGNOSIS — R351 Nocturia: Secondary | ICD-10-CM

## 2018-06-13 DIAGNOSIS — I10 Essential (primary) hypertension: Secondary | ICD-10-CM

## 2018-06-13 DIAGNOSIS — N183 Chronic kidney disease, stage 3 unspecified: Secondary | ICD-10-CM

## 2018-06-13 DIAGNOSIS — E538 Deficiency of other specified B group vitamins: Secondary | ICD-10-CM

## 2018-06-13 DIAGNOSIS — R7303 Prediabetes: Secondary | ICD-10-CM

## 2018-06-13 DIAGNOSIS — Z0000 Encounter for general adult medical examination without abnormal findings: Secondary | ICD-10-CM

## 2018-06-13 DIAGNOSIS — R310 Gross hematuria: Secondary | ICD-10-CM | POA: Diagnosis not present

## 2018-06-13 DIAGNOSIS — Z7901 Long term (current) use of anticoagulants: Secondary | ICD-10-CM

## 2018-06-13 DIAGNOSIS — E559 Vitamin D deficiency, unspecified: Secondary | ICD-10-CM

## 2018-06-13 DIAGNOSIS — C8590 Non-Hodgkin lymphoma, unspecified, unspecified site: Secondary | ICD-10-CM

## 2018-06-13 LAB — POCT URINALYSIS DIPSTICK
Bilirubin, UA: NEGATIVE
Blood, UA: NEGATIVE
GLUCOSE UA: NEGATIVE
Ketones, UA: NEGATIVE
Leukocytes, UA: NEGATIVE
Nitrite, UA: NEGATIVE
Protein, UA: NEGATIVE
Spec Grav, UA: 1.01 (ref 1.010–1.025)
Urobilinogen, UA: 0.2 E.U./dL
pH, UA: 6.5 (ref 5.0–8.0)

## 2018-06-13 NOTE — Patient Instructions (Addendum)
Thank you for coming to the office today.  No sign of urinary tract infection. Stay tuned for urine culture results.  Likely from Eliquis blood thinner, can ask Cardiology.  We can check blood test to see if any other cause of reduced energy in March 2020.  Try Vitamin D3 1,000 to 2,000 daily energy.  Try Vitamin B12 1,000 mcg daily as well  Please schedule a Follow-up Appointment to: Return if symptoms worsen or fail to improve, for hematuria.  If you have any other questions or concerns, please feel free to call the office or send a message through [facility]. You may also schedule an earlier appointment if necessary.  Additionally, you may be receiving a survey about your experience at our office within a few days to 1 week by e-mail or mail. We value your feedback.  Nobie Putnam, DO [place]

## 2018-06-13 NOTE — Telephone Encounter (Signed)
Spoke with patient's wife, ok per DPR. Patient had some blood in urine yesterday and it is burning when he urinates today. She would like for [place] to prescribe an antibiotic. Advised her to call patient's PCP today for evaluation and that if needed they would be the ones to prescribe an antibiotic. She was appreciative. Patient still plans to come and see [place] tomorrow for appointment that is already scheduled.

## 2018-06-13 NOTE — Progress Notes (Signed)
Subjective:    Patient ID: Clinton Joseph, male    DOB: 03/24/1928, 85 y.o.   MRN: [id number]  Clinton Joseph is a 85 y.o. male presenting on 06/13/2018 for Hematuria (Patient saw blood in urine- ) and Dysuria  Patient presents for a same day appointment.  HPI   GROSS HEMATURIA Reports new problem onset few days ago with acute episode of small streak of blood visualized by patient in urine, otherwise mostly normal urine recently. He admitted episode of dysuria at that time when the blood was seen. - He was started on Eliquis anticoagulation by Cardiology 04/15/18, taken off Aspirin - Taking Lasix 20mg  daily - no history of kidney stone - Additional reduced energy, he is asking about taking Vitamin D and B12, used to take these before Denies any fatigue or dyspnea, near syncope or passing out, other sign of bleeding, dark stool or blood in stool, persistent dysuria, flank or abdominal pain, reduced urine output   Depression screen [facility] 2/9 04/04/2018 02/23/2018 01/18/2018  Decreased Interest 1 0 0  Down, Depressed, Hopeless 3 0 0  PHQ - 2 Score 4 0 0  Altered sleeping 0 - -  Tired, decreased energy 0 - -  Change in appetite 0 - -  Feeling bad or failure about yourself  3 - -  Trouble concentrating 0 - -  Moving slowly or fidgety/restless 0 - -  Suicidal thoughts 0 - -  PHQ-9 Score 7 - -  Difficult doing work/chores Not difficult at all - -    Social History   Tobacco Use  . Smoking status: Never Smoker  . Smokeless tobacco: Never Used  Substance Use Topics  . Alcohol use: Yes    Alcohol/week: 2.0 standard drinks    Types: 2 Glasses of wine per week  . Drug use: No    Review of Systems Per HPI unless specifically indicated above     Objective:    BP 138/62   Pulse 63   Resp 16   Ht 5' 7.5" (1.715 m)   Wt 169 lb (76.7 kg)   SpO2 96%   BMI 26.08 kg/m   Wt Readings from Last 3 Encounters:  06/13/18 169 lb (76.7 kg)  05/31/18 170 lb 12.8 oz (77.5  kg)  05/09/18 175 lb (79.4 kg)    Physical Exam Vitals signs and nursing note reviewed.  Constitutional:      General: He is not in acute distress.    Appearance: He is well-developed. He is not diaphoretic.     Comments: Well-appearing, comfortable, cooperative  HENT:     Head: Normocephalic and atraumatic.  Eyes:     General:        Right eye: No discharge.        Left eye: No discharge.     Conjunctiva/sclera: Conjunctivae normal.  Cardiovascular:     Rate and Rhythm: Normal rate.  Pulmonary:     Effort: Pulmonary effort is normal.  Abdominal:     General: Bowel sounds are normal. There is no distension.     Palpations: Abdomen is soft.     Tenderness: There is no abdominal tenderness.  Musculoskeletal:     Comments: No CVAT  Skin:    General: Skin is warm and dry.     Findings: No erythema or rash.  Neurological:     Mental Status: He is alert and oriented to person, place, and time.  Psychiatric:        Behavior:  Behavior normal.     Comments: Well groomed, good eye contact, normal speech and thoughts    Results for orders placed or performed in visit on 06/13/18  POCT urinalysis dipstick  Result Value Ref Range   Color, UA dark yellow    Clarity, UA cloudy    Glucose, UA Negative Negative   Bilirubin, UA neg    Ketones, UA neg    Spec Grav, UA 1.010 1.010 - 1.025   Blood, UA negative    pH, UA 6.5 5.0 - 8.0   Protein, UA Negative Negative   Urobilinogen, UA 0.2 0.2 or 1.0 E.U./dL   Nitrite, UA negative    Leukocytes, UA Negative Negative   Appearance some thicker sedement within urine    Odor no       Assessment & Plan:   Problem List Items Addressed This Visit    None    Visit Diagnoses    Gross hematuria    -  Primary   Relevant Orders   POCT urinalysis dipstick (Completed)      Clinically with reported gross hematuria x1 episode without prior history or recurrence. Some urinary sediment on testing. Not supported by UA today (negative  dipstick) - Suspected secondary to anticoagulation Eliquis for AFlutter new in past 2 months - Concern age 32 male, at risk of malignancy - Considered infection UTI but clinically not entirely consistent, also consider Nephrolithiasis but history not as supportive  Plan: 1. Urinalysis dipstick, negative 2. Consider urine culture - order placed initially, urine sample not adequate for culture, notified patient, agree to hold off repeat sample for now, only if not improving can return for urine sample - if within 1-2 week he can just come by to provide sample, less likely require office visit 3. Advised most likely Eliquis, discuss further with Cardiology, reassurance from me if very rare episode 1 every few month, and otherwise asymptomatic, less significance but if persistent problem or new worsening symptoms, may need to adjust Eliquis or consult with urology for 2nd opinion - may warrant imaging Hematuria CT / Cystoscopy    No orders of the defined types were placed in this encounter.   Follow up plan: Return if symptoms worsen or fail to improve, for hematuria.  Future labs ordered for 07/05/18  Nobie Putnam, [facility] 06/13/2018, 4:11 PM

## 2018-06-13 NOTE — Progress Notes (Signed)
Cardiology Office Note Date:  06/14/2018  Patient ID:  Clinton, Joseph December 23, 1927, MRN [id number] PCP:  Olin Hauser, DO  Cardiologist:  Dr. Rockey Situ, MD Primary Electrophysiologist: Dr. Caryl Comes, MD    Chief Complaint: Follow up  History of Present Illness: Clinton Joseph is a 85 y.o. male with history of nonobstructive CAD, Mobitz type II heart block with syncope status post PPM, atrial flutter/fib diagnosed in 03/2018 on Eliquis status post briefly successful DCCV in 04/2018 status post spontaneous pharmacological conversion in 10/6193, chronic systolic CHF, lymphoma, hypertension, and hyperlipidemia who presents for follow-up of his systolic CHF.  Patient underwent cardiac cath in 10/2010 that demonstrated nonobstructive CAD.  He was noted to have Mobitz type II heart block with syncope in 11/2013 and underwent successful PPM implantation at that time.  Echo at that time showed an EF of 55 to 60%, mildly dilated left atrium, mild to moderate aortic valve sclerosis without stenosis.  He was noted in late 2019 to have elevated atrial rates by remote device monitoring consistent with A. fib/flutter.  He was seen by his primary cardiologist on 04/15/2018 and was noted to be asymptomatic and not in atrial flutter that day.  His rate was well controlled.  In the setting of a CHADS2VASc of 4, he was placed on Eliquis 5 mg twice daily, with discontinuation of aspirin.  He was seen in the office on 04/27/2018 and noted a 2-week history of increase in lower extremity swelling.  He was noted to be relatively sedentary at baseline and had not noted any change in his exercise tolerance.  He reported no regular weights and did not monitor his salt intake.  At that visit, he had a weight of 177 pounds.  This was up 2 pounds when compared to his 04/15/2018 visit.  EKG showed atrial flutter with variable conduction, 72 bpm, PVCs.  He was felt to be holding onto fluid in the setting of atrial  flutter with loss of atrial kick.  In that setting, he was started on Lasix 20 mg daily.  He followed up with EP on 05/03/2018 with a weight of 175 pounds.  Device interrogation showed persistent atrial fibrillation.  He continued to show signs of significant volume overload.  His Lasix was increased to 40 mg daily.  He underwent successful cardioversion on 05/09/2018 with conversion to sinus tachycardia with a rate of 100 bpm that improved to 75 bpm following metoprolol 5 mg IV x1.  Echo was performed on 05/26/2018 which showed a severely reduced LV systolic function with an EF of less than 20%, mildly dilated LV, global hypokinesis, mildly reduced RV systolic function with mildly enlarged RV cavity, mildly elevated RVSP of 56.6 mmHg, moderately dilated left atrium, mildly dilated right atrium, moderate to severe mitral regurgitation, moderate to severe tricuspid regurgitation.  It appeared the patient was possibly back in A. fib/flutter with RVR at the time of his echo.  Patient was reportedly asymptomatic per notes.  He was started on Coreg 6.25 mg twice daily as well as amiodarone 400 mg twice daily for 7 days then 200 mg twice daily thereafter.  His losartan was increased to 50 mg daily.  Repeat cardioversion, following amiodarone loading, was scheduled for 06/15/2018.  He was recently seen by EP on 05/31/2018 and noted significant improvement in symptoms since initiation of amiodarone which was associated with conversion to sinus rhythm.  At his visit with EP, he was noted to be AV paced.  He denied any  shortness of breath though was still significantly volume overloaded.  Documented weight at that visit was noted to be 170 pounds.  In this setting, his Lasix was increased from 20 mg to 40 mg x 5 days.  With his history of borderline hyperkalemia, he was not felt to be a good candidate for spironolactone.  Consideration for transitioning from losartan to [facility] was also recommended.  In telephone follow-up on  06/06/2018 patient reported good urine output and denied any shortness of breath following the above outpatient diuresis.  Patient's wife called on 2/24 noting the patient was experiencing some dysuria and hematuria.  He was advised to contact PCP for further evaluation.  Patient saw PCP for episode of hematuria on 2/24 with urinalysis dipstick being negative for infection.  Urinalysis details showed blood negative, nitrite negative, LE negative, no microscopic exam noted.  Urine sample was not adequate for culture.  Labs: 05/2018 - TSH normal, LFT normal 04/2018 - potassium 5.0, serum creatinine 1.36, hemoglobin 12.5 06/2017 - LDL 135, A1c 5.9  Patient comes in accompanied by his wife today and notes significant improvement in his shortness of breath.  His weight is down 3 pounds from 170-167 pounds when compared to his last visit with [place] on 05/31/2018.  He does continue to note fatigue and generalized weakness.  He has recently been started on B12 and vitamin D by PCP.  He reports 2 isolated episodes of streaking blood in his urine on 2/22 and 2/23 with no episodes since.  No BRBPR or melena.  No falls.  Both patient and wife indicate he does not eat foods high in salt and does not add salt to his food.  They do not eat out at restaurants often.  Lower extremity swelling is stable.  Not wearing compression stockings.  No dizziness, presyncope, or syncope.  No orthopnea, PND, or early satiety.  No cough.  No chest pain.  Compliant with all medications.  Past Medical History:  Diagnosis Date  . Anxiety state, unspecified   . Arthritis    a. Hands.  . Atrial flutter ([place])    a. Dx 03/2018-->Eliquis 5 BID started (CHA2DS2VASc = 5).  . Diffuse large B cell lymphoma ([place])    a. 2007 s/p chemo - followed by Dr. Jeb Levering.  . H/O echocardiogram    a. 11/2013 Echo: EF 55-60%, mildly dil LA, mild to mod Ao sclerosis.  . Hyperlipidemia   . Hypertensive heart disease   . Mobitz II    a. 11/2013 syncope-->s/p SJM  Assurity DR model V7204091 (ser # [id number]).  . Non-obstructive CAD    a. 10/2010 Cath: >M nl, LAD/LCX min irregs, RCA 40p, EF 60%.  . Tremor    a. right hand  . Urticaria, unspecified   . Ventricular tachycardia (HCC)    a. 2012->broke with amio, neg w/u.    Past Surgical History:  Procedure Laterality Date  . APPENDECTOMY  1948  . CARDIAC CATHETERIZATION  11/17/2010   [facility]- LAD min irregs, LCX min irregs, RCA 40p, EF 60%.   . CARDIOVERSION N/A 05/09/2018   Procedure: CARDIOVERSION (CATH LAB);  Surgeon: Minna Merritts, MD;  Location: [facility] ORS;  Service: Cardiovascular;  Laterality: N/A;  . CATARACT EXTRACTION, BILATERAL Bilateral ~ 2012  . INSERT / REPLACE / REMOVE PACEMAKER  12/05/2013  . PERMANENT PACEMAKER INSERTION N/A 12/05/2013   Procedure: PERMANENT PACEMAKER INSERTION;  Surgeon: Coralyn Mark, MD;  Location: [place] CATH LAB;  Service: Cardiovascular;  Laterality: N/A;  Current Meds  Medication Sig  . acetaminophen (TYLENOL) 650 MG CR tablet Take 1,300 mg by mouth daily.  Marland Kitchen amiodarone (PACERONE) 200 MG tablet Take 2 tablets (400 mg total) by mouth 2 (two) times daily for 7 days, THEN 1 tablet (200 mg total) 2 (two) times daily for 23 days.  Marland Kitchen apixaban (ELIQUIS) 5 MG TABS tablet Take 1 tablet (5 mg total) by mouth 2 (two) times daily.  . carvedilol (COREG) 6.25 MG tablet Take 1 tablet (6.25 mg total) by mouth 2 (two) times daily.  . cetirizine (ZYRTEC) 10 MG tablet Take 10 mg by mouth daily.  . furosemide (LASIX) 20 MG tablet Take 1 tablet (20 mg total) by mouth daily. (Patient taking differently: Take 20 mg by mouth See admin instructions. Taking 2 tablets (40 mg) by mouth in the morning for 3 days, then decrease back to 1 tablet (20 mg) by mouth daily (resume regular dose on 05/06/2018))  . losartan (COZAAR) 50 MG tablet Take 1 tablet (50 mg total) by mouth daily.  . Multiple Vitamins-Minerals (MULTIVITAMIN WITH MINERALS) tablet Take 1 tablet by mouth daily. Centrum Silver  .  mupirocin ointment (BACTROBAN) 2 % Apply 1 application topically 2 (two) times daily. For 7-10 days  . Omega-3 Fatty Acids (FISH OIL) 1000 MG CAPS Take 1,000-2,000 mg by mouth See admin instructions. Take 2 capsules (2000 mg) by mouth in the morninig & 1 capsule (1000 mg) by mouth night.  . vitamin B-12 (CYANOCOBALAMIN) 1000 MCG tablet Take 1,000 mcg by mouth daily.    Allergies:   Keflex [cephalexin]; Penicillins; and Tape   Social History:  The patient  reports that he has never smoked. He has never used smokeless tobacco. He reports current alcohol use of about 2.0 standard drinks of alcohol per week. He reports that he does not use drugs.   Family History:  The patient's family history includes Coronary artery disease in his mother; Emphysema in his father.  ROS:   Review of Systems  Constitutional: Positive for malaise/fatigue. Negative for chills, diaphoresis, fever and weight loss.  HENT: Negative for congestion.   Eyes: Negative for discharge and redness.  Respiratory: Negative for cough, hemoptysis, sputum production, shortness of breath and wheezing.   Cardiovascular: Positive for leg swelling. Negative for chest pain, palpitations, orthopnea, claudication and PND.  Gastrointestinal: Negative for abdominal pain, blood in stool, heartburn, melena, nausea and vomiting.  Genitourinary: Positive for dysuria and hematuria.  Musculoskeletal: Negative for falls and myalgias.  Skin: Negative for rash.  Neurological: Positive for weakness. Negative for dizziness, tingling, tremors, sensory change, speech change, focal weakness and loss of consciousness.  Endo/Heme/Allergies: Does not bruise/bleed easily.  Psychiatric/Behavioral: Negative for substance abuse. The patient is not nervous/anxious.   All other systems reviewed and are negative.    PHYSICAL EXAM:  VS:  BP 124/64 (BP Location: Left Arm, Patient Position: Sitting, Cuff Size: Normal)   Pulse 63   Ht 5\' 8"  (1.727 m)   Wt 167  lb 12 oz (76.1 kg)   BMI 25.51 kg/m  BMI: Body mass index is 25.51 kg/m.  Physical Exam  Constitutional: He is oriented to person, place, and time. He appears well-developed and well-nourished.  HENT:  Head: Normocephalic and atraumatic.  Eyes: Right eye exhibits no discharge. Left eye exhibits no discharge.  Neck: Normal range of motion. No JVD present.  Cardiovascular: Normal rate, regular rhythm, S1 normal, S2 normal and normal heart sounds. Exam reveals no distant heart sounds, no friction rub,  no midsystolic click and no opening snap.  No murmur heard. Pulmonary/Chest: Effort normal and breath sounds normal. No respiratory distress. He has no decreased breath sounds. He has no wheezes. He has no rales. He exhibits no tenderness.  Abdominal: Soft. He exhibits no distension. There is no abdominal tenderness.  Musculoskeletal:        General: Edema present.     Comments: 1+ bilateral lower extremity pitting edema to the mid shins  Neurological: He is alert and oriented to person, place, and time.  Skin: Skin is warm and dry. No cyanosis. Nails show no clubbing.  Psychiatric: He has a normal mood and affect. His speech is normal and behavior is normal. Judgment and thought content normal.     EKG:  Was ordered and interpreted by me today. Shows A sensed/V paced rhythm, 63 bpm  Recent Labs: 05/03/2018: BUN 27; Creatinine, Ser 1.36; Hemoglobin 12.5; Platelets 183; Potassium 5.0; Sodium 142 05/31/2018: ALT 22; TSH 2.890  06/23/2017: Cholesterol 208; HDL 43; LDL Cholesterol (Calc) 135; Total CHOL/HDL Ratio 4.8; Triglycerides 166   CrCl cannot be calculated (Patient's most recent lab result is older than the maximum 21 days allowed.).   Wt Readings from Last 3 Encounters:  06/14/18 167 lb 12 oz (76.1 kg)  06/13/18 169 lb (76.7 kg)  05/31/18 170 lb 12.8 oz (77.5 kg)     Other studies reviewed: Additional studies/records reviewed today include: summarized above  ASSESSMENT AND  PLAN:  1. Nonobstructive CAD: No symptoms concerning for angina.  On Eliquis in place of aspirin.  Not currently on a statin.  Aggressive risk factor modification.  If EF remains low following maintenance of paced rhythm and escalation/optimization of heart failure therapy he will need repeat ischemic evaluation in follow-up.  2. Persistent A. Fib/flutter: Pharmacologically cardioversion noted at his last visit with [place] on 05/31/2018 leading to the cancellation of scheduled cardioversion which was to take place on 06/15/2018.  He is maintaining an AV paced rhythm today.  He will remain on amiodarone 200 mg twice daily through 06/25/2018.  Following this, he will reduce his amiodarone dose to 200 mg once daily.  Recent liver function and TSH normal.  Continue Coreg 6.25 mg twice daily for rate control.  Given his CHADS2VASc of at least 5 (CHF, HTN, age x 2, vascular disease) he will remain on Eliquis to reduce stroke risk.  However, he was noted to have 2 isolated episodes of streaking hematuria as outlined below and if this recurs, this will need to be readdressed.  3. HFrEF: Patient indicates significant improvement in dyspnea following outpatient diuresis.  His main complaint now is weakness/generalized fatigue.  This is likely in the setting of physical deconditioning with a relatively sedentary lifestyle as well as cardiomyopathy that has been felt to be tachycardia mediated.  Now that he is in an AV paced rhythm, following optimization of evidence-based heart failure therapy we will plan to recheck a limited echocardiogram in approximately 1 month to evaluate for improvement in his cardiomyopathy.  We will discontinue losartan with the patient to start low-dose Entresto on 06/15/2018.  We will check a BMP today to assess renal function and potassium following recent outpatient diuresis and again in 1 week following initiation of Entresto.  It has been felt he may not tolerate spironolactone given history of  hyperkalemia.  He will be maintained on Coreg 6.25 mg twice daily.  If his EF remains low after he has been in a paced rhythm for several weeks  and with optimization of heart failure therapy we may need to revisit ischemic evaluation in follow-up.  CHF education provided.  4. Syncope with Mobitz type II: Status post PPM.  Followed by EP.  5. Hypertension: Blood pressure well controlled today.  Transition from losartan to [facility] as above with planned recheck a BMP in 1 week.  6. Hyperlipidemia: LDL 135 from 06/2017.  Not currently on statin.  Followed by PCP.  7. Hematuria: 2 isolated episodes of streaking blood noted in the urine while voiding on 2/22 and 2/23 with associated dysuria.  Subsequently, he has voided numerous times without any noted blood.  Recent urinalysis on 06/13/2018 negative for blood, LE, and nitrite on the dipstick with thicker sediment noted within the urine of uncertain etiology.  Microscopic exam was not performed.  I have recommended the patient continue to monitor his urine and follow-up with PCP for a recheck urinalysis in 1 to 2 weeks to ensure no microscopic hematuria is noted.  He should follow-up with PCP and let cardiology know sooner if he experiences recurrence of visual blood in the urine.  If that does occur, I recommend he be evaluated by urology and we may need to transition his DOAC.  Check CBC today.   Disposition: F/u with Dr. Rockey Situ or an APP in 4 weeks and EP as directed.   Current medicines are reviewed at length with the patient today.  The patient did not have any concerns regarding medicines.  Melvern Banker PA-C 06/14/2018 2:02 PM     [address] [place], [place] [address] [phone]

## 2018-06-13 NOTE — Telephone Encounter (Signed)
New Message  Pts wife verbalized pt has Kidney infection and pt is on lasix and not sure if it has anything to do with it or not.  Pt verbalized she wants to keep appt for tomorrow and wants to speak to nurse in regards to an antibiotic.  Please f/u

## 2018-06-14 ENCOUNTER — Ambulatory Visit: Payer: Medicare Other | Admitting: Physician Assistant

## 2018-06-14 ENCOUNTER — Other Ambulatory Visit: Payer: Medicare Other

## 2018-06-14 ENCOUNTER — Encounter: Payer: Self-pay | Admitting: Physician Assistant

## 2018-06-14 ENCOUNTER — Ambulatory Visit: Payer: Medicare Other

## 2018-06-14 VITALS — BP 124/64 | HR 63 | Ht 68.0 in | Wt 167.8 lb

## 2018-06-14 DIAGNOSIS — I10 Essential (primary) hypertension: Secondary | ICD-10-CM | POA: Diagnosis not present

## 2018-06-14 DIAGNOSIS — I442 Atrioventricular block, complete: Secondary | ICD-10-CM

## 2018-06-14 DIAGNOSIS — E782 Mixed hyperlipidemia: Secondary | ICD-10-CM

## 2018-06-14 DIAGNOSIS — I5022 Chronic systolic (congestive) heart failure: Secondary | ICD-10-CM | POA: Diagnosis not present

## 2018-06-14 DIAGNOSIS — I4819 Other persistent atrial fibrillation: Secondary | ICD-10-CM

## 2018-06-14 DIAGNOSIS — R319 Hematuria, unspecified: Secondary | ICD-10-CM | POA: Diagnosis not present

## 2018-06-14 DIAGNOSIS — I2510 Atherosclerotic heart disease of native coronary artery without angina pectoris: Secondary | ICD-10-CM

## 2018-06-14 DIAGNOSIS — Z79899 Other long term (current) drug therapy: Secondary | ICD-10-CM | POA: Diagnosis not present

## 2018-06-14 DIAGNOSIS — I4892 Unspecified atrial flutter: Secondary | ICD-10-CM

## 2018-06-14 MED ORDER — AMIODARONE HCL 200 MG PO TABS: ORAL_TABLET | ORAL | 3 refills | Status: DC

## 2018-06-14 MED ORDER — SACUBITRIL-VALSARTAN 24-26 MG PO TABS: 1.0000 | ORAL_TABLET | Freq: Two times a day (BID) | ORAL | 2 refills | Status: DC

## 2018-06-14 NOTE — Patient Instructions (Addendum)
Medication Instructions:  Your physician has recommended you make the following change in your medication:  1- STOP Losartan. 2- START Entresto 24/26 mg (1 tablet) by mouth two times a day. 3 - TAKE Amiodarone 200 mg by mouth two times a day through March 7th, then take 200 mg by mouth once a day starting 3.8 and thereafter.  If you need a refill on your cardiac medications before your next appointment, please call your pharmacy.   Lab work: Your physician recommends that you return for lab work in:  Today - BMET, CBC.   Your physician recommends that you return for lab work in: 1 week in the office to check BMET.  If you have labs (blood work) drawn today and your tests are completely normal, you will receive your results only by: Marland Kitchen MyChart Message (if you have MyChart) OR . A paper copy in the mail If you have any lab test that is abnormal or we need to change your treatment, we will call you to review the results.  Testing/Procedures: Your physician has requested that you have an limited echocardiogram in 1 month. Echocardiography is a painless test that uses sound waves to create images of your heart. It provides your doctor with information about the size and shape of your heart and how well your heart's chambers and valves are working. This procedure takes approximately one hour. There are no restrictions for this procedure. You may get an IV, if needed, to receive an ultrasound enhancing agent through to better visualize your heart.     Follow-Up: At [facility], you and your health needs are our priority.  As part of our continuing mission to provide you with exceptional heart care, we have created designated Provider Care Teams.  These Care Teams include your primary Cardiologist (physician) and Advanced Practice Providers (APPs -  Physician Assistants and Nurse Practitioners) who all work together to provide you with the care you need, when you need it. You will need a follow  up appointment in 1 months after limited echo.  Please call our office 2 months in advance to schedule this appointment.  You may see Dr Rockey Situ or one of the following Advanced Practice Providers on your designated Care Team:   Murray Hodgkins, NP Christell Faith, PA-C . Marrianne Mood, PA-C

## 2018-06-15 ENCOUNTER — Ambulatory Visit (INDEPENDENT_AMBULATORY_CARE_PROVIDER_SITE_OTHER): Payer: Medicare Other | Admitting: Family Medicine

## 2018-06-15 ENCOUNTER — Other Ambulatory Visit: Payer: Self-pay

## 2018-06-15 ENCOUNTER — Ambulatory Visit: Admit: 2018-06-15 | Payer: Medicare Other | Admitting: Cardiovascular Disease

## 2018-06-15 ENCOUNTER — Telehealth: Payer: Self-pay | Admitting: Physician Assistant

## 2018-06-15 ENCOUNTER — Encounter: Payer: Self-pay | Admitting: Family Medicine

## 2018-06-15 ENCOUNTER — Ambulatory Visit (INDEPENDENT_AMBULATORY_CARE_PROVIDER_SITE_OTHER): Payer: Medicare Other | Admitting: *Deleted

## 2018-06-15 ENCOUNTER — Other Ambulatory Visit: Payer: Self-pay | Admitting: Cardiovascular Disease

## 2018-06-15 VITALS — BP 153/77 | HR 67 | Temp 97.7°F | Resp 16 | Ht 68.0 in | Wt 168.0 lb

## 2018-06-15 DIAGNOSIS — I441 Atrioventricular block, second degree: Secondary | ICD-10-CM

## 2018-06-15 DIAGNOSIS — S61215A Laceration without foreign body of left ring finger without damage to nail, initial encounter: Secondary | ICD-10-CM | POA: Diagnosis not present

## 2018-06-15 DIAGNOSIS — I5031 Acute diastolic (congestive) heart failure: Secondary | ICD-10-CM

## 2018-06-15 LAB — CBC WITH DIFFERENTIAL/PLATELET
Basophils Absolute: 0.1 10*3/uL (ref 0.0–0.2)
Basos: 1 %
EOS (ABSOLUTE): 0.2 10*3/uL (ref 0.0–0.4)
Eos: 2 %
Hematocrit: 36.8 % — ABNORMAL LOW (ref 37.5–51.0)
Hemoglobin: 12.5 g/dL — ABNORMAL LOW (ref 13.0–17.7)
Immature Grans (Abs): 0 10*3/uL (ref 0.0–0.1)
Immature Granulocytes: 0 %
Lymphocytes Absolute: 1.2 10*3/uL (ref 0.7–3.1)
Lymphs: 18 %
MCH: 31.5 pg (ref 26.6–33.0)
MCHC: 34 g/dL (ref 31.5–35.7)
MCV: 93 fL (ref 79–97)
Monocytes Absolute: 0.6 10*3/uL (ref 0.1–0.9)
Monocytes: 9 %
Neutrophils Absolute: 4.8 10*3/uL (ref 1.4–7.0)
Neutrophils: 70 %
PLATELETS: 157 10*3/uL (ref 150–450)
RBC: 3.97 x10E6/uL — ABNORMAL LOW (ref 4.14–5.80)
RDW: 13.4 % (ref 11.6–15.4)
WBC: 6.8 10*3/uL (ref 3.4–10.8)

## 2018-06-15 LAB — BASIC METABOLIC PANEL
BUN/Creatinine Ratio: 19 (ref 10–24)
BUN: 27 mg/dL (ref 8–27)
CO2: 24 mmol/L (ref 20–29)
Calcium: 9.6 mg/dL (ref 8.6–10.2)
Chloride: 100 mmol/L (ref 96–106)
Creatinine, Ser: 1.39 mg/dL — ABNORMAL HIGH (ref 0.76–1.27)
GFR calc non Af Amer: 45 mL/min/{1.73_m2} — ABNORMAL LOW (ref >59)
GFR, EST AFRICAN AMERICAN: 52 mL/min/{1.73_m2} — AB (ref >59)
Glucose: 138 mg/dL — ABNORMAL HIGH (ref 65–99)
Potassium: 4.7 mmol/L (ref 3.5–5.2)
Sodium: 140 mmol/L (ref 134–144)

## 2018-06-15 SURGERY — CARDIOVERSION (CATH LAB)
Anesthesia: General

## 2018-06-15 MED ORDER — FUROSEMIDE 20 MG PO TABS: ORAL_TABLET | ORAL | Status: DC

## 2018-06-15 MED ORDER — DOXYCYCLINE HYCLATE 100 MG PO TABS: 100.0000 mg | ORAL_TABLET | Freq: Two times a day (BID) | ORAL | 0 refills | Status: DC

## 2018-06-15 MED ORDER — MUPIROCIN 2 % EX OINT: 1.0000 "application " | TOPICAL_OINTMENT | Freq: Two times a day (BID) | CUTANEOUS | 0 refills | Status: DC

## 2018-06-15 NOTE — Telephone Encounter (Signed)
This was sent in on 06/14/2018. Pharmacy is requesting a refill to have on file.  Please review. Thanks

## 2018-06-15 NOTE — Telephone Encounter (Signed)
Notes recorded by Rise Mu, PA-C on 06/15/2018 at 8:38 AM EST Renal function is relatively stable, though remains slightly above his baseline. Potassium is at goal and stable. Blood count remains mildly low, though stable. Decrease Lasix to every other day in an effort to preserve renal function. Keep planned BMP in 1 week.

## 2018-06-15 NOTE — Telephone Encounter (Signed)
I spoke with the patient's wife regarding the patient's lab results from yesterday (ok per DPR).  She is aware of Thurmond Butts, PA's recommendations to: 1) Decrease lasix to every other day 2) keep his already scheduled lab appointment on 3/3 for a repeat BMP  Mrs. Schreur voices understanding and is agreeable.

## 2018-06-15 NOTE — Patient Instructions (Signed)
Thank you for coming to the office today.  Start taking Doxycycline antibiotic 100mg  twice daily for 7 days. Take with full glass of water and stay upright for at least 30 min after taking, may be seated or standing, but should NOT lay down. This is just a safety precaution, if this medicine does not go all the way down throat well it could cause some burning discomfort to throat and esophagus.  Use antibiotic ointment 7-10 days  Keep pressure dressing as discussed until bleed stops. May change q 12-24 hours as needed  Go to ED if not improving, keep bleeding, worse sign of infection red swelling pain fever  Please schedule a Follow-up Appointment to: Return in about 1 week (around 06/22/2018), or if symptoms worsen or fail to improve, for finger injury.  If you have any other questions or concerns, please feel free to call the office or send a message through [place]. You may also schedule an earlier appointment if necessary.  Additionally, you may be receiving a survey about your experience at our office within a few days to 1 week by e-mail or mail. We value your feedback.  Nobie Putnam, DO [place]

## 2018-06-15 NOTE — Progress Notes (Signed)
Subjective:    Patient ID: Clinton Joseph, male    DOB: June 07, 1927, 85 y.o.   MRN: [id number]  Clinton Joseph is a 85 y.o. male presenting on 06/15/2018 for Finger Injury (left finger dog bite onset last night patient is on blood thinner)  Patient presents for a same day appointment. Patient is accompanied by wife, Bonnita Nasuti, who provides additional history.  HPI   Left ring finger laceration, superficial Here with new problem, onset last night 2/25 with accidental dog bite laceration injury to finger, described as a skin tear on top of finger, had bleeding since last night, difficulty in stopping bleeding still today due to blood thinner, Eliquis. He has good sensation in finger and no significant pain except if touch tip of finger. - Known prior history of dog bites in the past, similar accidental injury on hands from pet dog - They state that their dog is over age 30 years and has some health problems, they may be "putting the dog down" soon - Denies any fever, chills sweats spreading redness, other injury or laceration, drainage of pus   Depression screen [facility] 2/9 06/15/2018 04/04/2018 02/23/2018  Decreased Interest 0 1 0  Down, Depressed, Hopeless 0 3 0  PHQ - 2 Score 0 4 0  Altered sleeping - 0 -  Tired, decreased energy - 0 -  Change in appetite - 0 -  Feeling bad or failure about yourself  - 3 -  Trouble concentrating - 0 -  Moving slowly or fidgety/restless - 0 -  Suicidal thoughts - 0 -  PHQ-9 Score - 7 -  Difficult doing work/chores - Not difficult at all -    Social History   Tobacco Use  . Smoking status: Never Smoker  . Smokeless tobacco: Never Used  Substance Use Topics  . Alcohol use: Yes    Alcohol/week: 2.0 standard drinks    Types: 2 Glasses of wine per week  . Drug use: No    Review of Systems Per HPI unless specifically indicated above     Objective:    BP (!) 153/77   Pulse 67   Temp 97.7 F (36.5 C) (Oral)   Resp 16   Ht 5\' 8"   (1.727 m)   Wt 168 lb (76.2 kg)   BMI 25.54 kg/m   Wt Readings from Last 3 Encounters:  06/15/18 168 lb (76.2 kg)  06/14/18 167 lb 12 oz (76.1 kg)  06/13/18 169 lb (76.7 kg)    Physical Exam Vitals signs and nursing note reviewed.  Constitutional:      General: He is not in acute distress.    Appearance: He is well-developed. He is not diaphoretic.     Comments: Well-appearing, comfortable, cooperative  HENT:     Head: Normocephalic and atraumatic.  Eyes:     General:        Right eye: No discharge.        Left eye: No discharge.     Conjunctiva/sclera: Conjunctivae normal.  Cardiovascular:     Rate and Rhythm: Normal rate.  Pulmonary:     Effort: Pulmonary effort is normal.  Skin:    General: Skin is warm and dry.     Findings: No erythema or rash.     Comments: Left Ring Finger (4th) Tip of finger with 1-2 cm superficial only skin tear, no actual deeper laceration or puncture wound, mild tender only over raw skin, active bleeding still, no involvement of nailbed or other part  of hand or other finger  Neurological:     Mental Status: He is alert and oriented to person, place, and time.  Psychiatric:        Behavior: Behavior normal.     Comments: Well groomed, good eye contact, normal speech and thoughts    Results for orders placed or performed in visit on 17/51/02  Basic metabolic panel  Result Value Ref Range   Glucose 138 (H) 65 - 99 mg/dL   BUN 27 8 - 27 mg/dL   Creatinine, Ser 1.39 (H) 0.76 - 1.27 mg/dL   GFR calc non Af Amer 45 (L) >59 mL/min/1.73   GFR calc Af Amer 52 (L) >59 mL/min/1.73   BUN/Creatinine Ratio 19 10 - 24   Sodium 140 134 - 144 mmol/L   Potassium 4.7 3.5 - 5.2 mmol/L   Chloride 100 96 - 106 mmol/L   CO2 24 20 - 29 mmol/L   Calcium 9.6 8.6 - 10.2 mg/dL  CBC with Differential/Platelet  Result Value Ref Range   WBC 6.8 3.4 - 10.8 x10E3/uL   RBC 3.97 (L) 4.14 - 5.80 x10E6/uL   Hemoglobin 12.5 (L) 13.0 - 17.7 g/dL   Hematocrit 36.8 (L) 37.5  - 51.0 %   MCV 93 79 - 97 fL   MCH 31.5 26.6 - 33.0 pg   MCHC 34.0 31.5 - 35.7 g/dL   RDW 13.4 11.6 - 15.4 %   Platelets 157 150 - 450 x10E3/uL   Neutrophils 70 Not Estab. %   Lymphs 18 Not Estab. %   Monocytes 9 Not Estab. %   Eos 2 Not Estab. %   Basos 1 Not Estab. %   Neutrophils Absolute 4.8 1.4 - 7.0 x10E3/uL   Lymphocytes Absolute 1.2 0.7 - 3.1 x10E3/uL   Monocytes Absolute 0.6 0.1 - 0.9 x10E3/uL   EOS (ABSOLUTE) 0.2 0.0 - 0.4 x10E3/uL   Basophils Absolute 0.1 0.0 - 0.2 x10E3/uL   Immature Granulocytes 0 Not Estab. %   Immature Grans (Abs) 0.0 0.0 - 0.1 x10E3/uL      Assessment & Plan:   Problem List Items Addressed This Visit    None    Visit Diagnoses    Laceration of left ring finger without foreign body without damage to nail, initial encounter    -  Primary   Relevant Medications   doxycycline (VIBRA-TABS) 100 MG tablet   mupirocin ointment (BACTROBAN) 2 %      Clinically with actively bleeding superficial L ring finger laceration skin tear from dog injury Excessive bleeding due to chronic anticoagulation on Eliquis No obvious deeper tissue or tendon injury, sensation intact and full range of motion No sign of secondary infection Not candidate for suture, due to location and no remaining skin flap or laceration to approximate  Plan - Wound care today, cleansed with sterile saline, applied hydrogen peroxide wash, silver nitrate topical to help slow down bleeding only partially successful, antibacterial ointment and sterile non stick pad cut to size for pressure and used coban self adhere dressing to wrap for pressure dressing - May change 1-2 times a day if need, advised on wound care - Sent empiric Doxycycline for 7 days, allergy to PCN, cover after dog injury - Sent topical Mupirocin ointment BID 7-10 days  Return precautions if not stop bleeding in 24-48 hours, may need to go to hospital or urgent care for other intervention   Meds ordered this encounter    Medications  . doxycycline (VIBRA-TABS) 100 MG tablet  Sig: Take 1 tablet (100 mg total) by mouth 2 (two) times daily. For 7 days. Take with full glass of water, stay upright 30 min after taking.    Dispense:  14 tablet    Refill:  0  . mupirocin ointment (BACTROBAN) 2 %    Sig: Apply 1 application topically 2 (two) times daily. For 7-10 days to finger    Dispense:  22 g    Refill:  0     Follow up plan: Return in about 1 week (around 06/22/2018), or if symptoms worsen or fail to improve, for finger injury.   Nobie Putnam, [facility] 06/15/2018, 10:02 AM

## 2018-06-16 ENCOUNTER — Telehealth: Payer: Self-pay | Admitting: Family Medicine

## 2018-06-16 LAB — CUP PACEART REMOTE DEVICE CHECK
Battery Remaining Longevity: 107 mo
Battery Remaining Percentage: 95.5 %
Battery Voltage: 3.01 V
Brady Statistic AP VP Percent: 71 %
Brady Statistic AP VS Percent: 1 %
Brady Statistic AS VP Percent: 29 %
Brady Statistic AS VS Percent: 1 %
Brady Statistic RA Percent Paced: 71 %
Brady Statistic RV Percent Paced: 99 %
Date Time Interrogation Session: 20200226101346
Implantable Lead Implant Date: 20150818
Implantable Lead Implant Date: 20150818
Implantable Lead Location: 753859
Implantable Lead Location: 753860
Implantable Lead Model: 1948
Implantable Pulse Generator Implant Date: 20150818
Lead Channel Impedance Value: 710 Ohm
Lead Channel Pacing Threshold Amplitude: 0.5 V
Lead Channel Pacing Threshold Amplitude: 0.5 V
Lead Channel Pacing Threshold Pulse Width: 0.5 ms
Lead Channel Pacing Threshold Pulse Width: 0.5 ms
Lead Channel Sensing Intrinsic Amplitude: 10 mV
Lead Channel Sensing Intrinsic Amplitude: 4.9 mV
Lead Channel Setting Pacing Amplitude: 2 V
Lead Channel Setting Pacing Amplitude: 2.5 V
Lead Channel Setting Pacing Pulse Width: 0.5 ms
Lead Channel Setting Sensing Sensitivity: 4 mV
MDC IDC MSMT LEADCHNL RA IMPEDANCE VALUE: 380 Ohm
Pulse Gen Model: 2240
Pulse Gen Serial Number: 7664739

## 2018-06-16 NOTE — Telephone Encounter (Signed)
I think the patient is confused and he picked a Rx for Lexapro from pharmacy, which looks like D/C in 12/19 as per your notes and he was supposed to take Sertraline 25-50 mg daily with food. Confirmed with pharmacy he only had picked up Sertraline in 12/19 and today was Lexapro. The wife is asking which one he needs to be on now. I told her that I would confirm with Dr. Raliegh Ip and will call her back.

## 2018-06-16 NOTE — Telephone Encounter (Signed)
Do not see in his active medication list ?

## 2018-06-16 NOTE — Telephone Encounter (Signed)
Reviewed chart and med list again.  I agree. It sounds like there is confusion on his medications.  At this time, my preference is actually going to be HOLD both medications for now. He is now on Amiodarone for his heart, and this may cause interaction with these medications.  In the future, we can re-discuss Sertraline as an option for him.  He told me in the past that Lexapro did not work at all.  Nobie Putnam, [facility] 06/16/2018, 6:06 PM

## 2018-06-16 NOTE — Telephone Encounter (Signed)
Pt asked if he needs to take the doxycycline [phone]

## 2018-06-16 NOTE — Telephone Encounter (Signed)
Wife advised that Rx was send to pharmacy yesterday.

## 2018-06-16 NOTE — Telephone Encounter (Signed)
On 04/15/18 - he told me that he stopped taking Lexapro (Escitalopram).  We could consider restarting it if his mood was not doing well, I would recommend that he wait to discuss this again in the office before we restart unless he feels he needs it sooner.  Nobie Putnam, DO [facility] 06/16/2018, 2:44 PM

## 2018-06-16 NOTE — Telephone Encounter (Signed)
Pt needs a refill on escitalopram sent to [facility].  His call back [phone]

## 2018-06-17 NOTE — Telephone Encounter (Signed)
As per Dr. Raliegh Ip advised patient's spouse to not take any either Lexapro or Sertraline. She understand very well he was on Lexapro. She will call if patient will complain about mood swings.

## 2018-06-21 ENCOUNTER — Other Ambulatory Visit (INDEPENDENT_AMBULATORY_CARE_PROVIDER_SITE_OTHER): Payer: Medicare Other

## 2018-06-21 DIAGNOSIS — I10 Essential (primary) hypertension: Secondary | ICD-10-CM | POA: Diagnosis not present

## 2018-06-21 DIAGNOSIS — R319 Hematuria, unspecified: Secondary | ICD-10-CM | POA: Diagnosis not present

## 2018-06-21 DIAGNOSIS — I4892 Unspecified atrial flutter: Secondary | ICD-10-CM | POA: Diagnosis not present

## 2018-06-21 NOTE — Telephone Encounter (Signed)
Spoke with patient.  Verified that patient had medication.  Patient states he currently has this medication and does not need a refill.

## 2018-06-22 ENCOUNTER — Telehealth: Payer: Self-pay

## 2018-06-22 LAB — BASIC METABOLIC PANEL WITH GFR
BUN/Creatinine Ratio: 18 (ref 10–24)
BUN: 22 mg/dL (ref 8–27)
CO2: 23 mmol/L (ref 20–29)
Calcium: 9.6 mg/dL (ref 8.6–10.2)
Chloride: 102 mmol/L (ref 96–106)
Creatinine, Ser: 1.23 mg/dL (ref 0.76–1.27)
GFR calc Af Amer: 60 mL/min/1.73
GFR calc non Af Amer: 52 mL/min/1.73 — ABNORMAL LOW
Glucose: 120 mg/dL — ABNORMAL HIGH (ref 65–99)
Potassium: 4.8 mmol/L (ref 3.5–5.2)
Sodium: 142 mmol/L (ref 134–144)

## 2018-06-22 NOTE — Telephone Encounter (Signed)
Spoke to wife Bonnita Nasuti, okay per DPR. Reviewed lab results and POC. She verbalized understanding and had no further questions at this time.   Advised pt to call for any further questions or concerns. F/u as planned.

## 2018-06-22 NOTE — Progress Notes (Signed)
Remote pacemaker transmission.   

## 2018-06-22 NOTE — Telephone Encounter (Signed)
-----   Message from Rise Mu, PA-C sent at 06/22/2018  6:51 AM EST ----- Renal function is improved on lower dose Lasix. Potassium is stable.

## 2018-07-04 ENCOUNTER — Other Ambulatory Visit: Payer: Self-pay | Admitting: Internal Medicine

## 2018-07-05 ENCOUNTER — Other Ambulatory Visit: Payer: Self-pay

## 2018-07-05 ENCOUNTER — Other Ambulatory Visit: Payer: Medicare Other

## 2018-07-05 DIAGNOSIS — E782 Mixed hyperlipidemia: Secondary | ICD-10-CM | POA: Diagnosis not present

## 2018-07-05 DIAGNOSIS — I10 Essential (primary) hypertension: Secondary | ICD-10-CM

## 2018-07-05 DIAGNOSIS — R7303 Prediabetes: Secondary | ICD-10-CM

## 2018-07-05 DIAGNOSIS — E538 Deficiency of other specified B group vitamins: Secondary | ICD-10-CM

## 2018-07-05 DIAGNOSIS — Z0000 Encounter for general adult medical examination without abnormal findings: Secondary | ICD-10-CM

## 2018-07-05 DIAGNOSIS — E559 Vitamin D deficiency, unspecified: Secondary | ICD-10-CM

## 2018-07-05 DIAGNOSIS — N183 Chronic kidney disease, stage 3 unspecified: Secondary | ICD-10-CM

## 2018-07-05 DIAGNOSIS — C859A Non-Hodgkin lymphoma, unspecified, in remission: Secondary | ICD-10-CM

## 2018-07-05 DIAGNOSIS — R351 Nocturia: Secondary | ICD-10-CM

## 2018-07-05 DIAGNOSIS — C8590 Non-Hodgkin lymphoma, unspecified, unspecified site: Secondary | ICD-10-CM

## 2018-07-06 LAB — VITAMIN D 25 HYDROXY (VIT D DEFICIENCY, FRACTURES): VIT D 25 HYDROXY: 53 ng/mL (ref 30–100)

## 2018-07-06 LAB — CUP PACEART INCLINIC DEVICE CHECK
Battery Remaining Longevity: 105 mo
Battery Voltage: 3.01 V
Brady Statistic RA Percent Paced: 8.7 %
Brady Statistic RV Percent Paced: 99.05 %
Date Time Interrogation Session: 20200211143259
Implantable Lead Implant Date: 20150818
Implantable Lead Implant Date: 20150818
Implantable Lead Location: 753859
Implantable Lead Model: 1948
Implantable Pulse Generator Implant Date: 20150818
Lead Channel Impedance Value: 712.5 Ohm
Lead Channel Pacing Threshold Amplitude: 0.5 V
Lead Channel Pacing Threshold Amplitude: 0.5 V
Lead Channel Pacing Threshold Pulse Width: 0.5 ms
Lead Channel Pacing Threshold Pulse Width: 0.5 ms
Lead Channel Sensing Intrinsic Amplitude: 10 mV
Lead Channel Sensing Intrinsic Amplitude: 4.9 mV
Lead Channel Setting Pacing Amplitude: 2 V
Lead Channel Setting Pacing Pulse Width: 0.5 ms
Lead Channel Setting Sensing Sensitivity: 4 mV
MDC IDC LEAD LOCATION: 753860
MDC IDC MSMT LEADCHNL RA IMPEDANCE VALUE: 387.5 Ohm
MDC IDC SET LEADCHNL RV PACING AMPLITUDE: 2.5 V
Pulse Gen Model: 2240
Pulse Gen Serial Number: 7664739

## 2018-07-06 LAB — CBC WITH DIFFERENTIAL/PLATELET
Absolute Monocytes: 485 cells/uL (ref 200–950)
Basophils Absolute: 82 cells/uL (ref 0–200)
Basophils Relative: 1.3 %
Eosinophils Absolute: 290 cells/uL (ref 15–500)
Eosinophils Relative: 4.6 %
HCT: 42.9 % (ref 38.5–50.0)
Hemoglobin: 13.8 g/dL (ref 13.2–17.1)
Lymphs Abs: 1317 cells/uL (ref 850–3900)
MCH: 30.3 pg (ref 27.0–33.0)
MCHC: 32.2 g/dL (ref 32.0–36.0)
MCV: 94.3 fL (ref 80.0–100.0)
MPV: 12 fL (ref 7.5–12.5)
Monocytes Relative: 7.7 %
Neutro Abs: 4127 cells/uL (ref 1500–7800)
Neutrophils Relative %: 65.5 %
Platelets: 168 10*3/uL (ref 140–400)
RBC: 4.55 10*6/uL (ref 4.20–5.80)
RDW: 13.7 % (ref 11.0–15.0)
TOTAL LYMPHOCYTE: 20.9 %
WBC: 6.3 10*3/uL (ref 3.8–10.8)

## 2018-07-06 LAB — HEMOGLOBIN A1C
Hgb A1c MFr Bld: 6 % of total Hgb — ABNORMAL HIGH (ref <5.7)
Mean Plasma Glucose: 126 (calc)
eAG (mmol/L): 7 (calc)

## 2018-07-06 LAB — VITAMIN B12: Vitamin B-12: 734 pg/mL (ref 200–1100)

## 2018-07-06 LAB — COMPLETE METABOLIC PANEL WITH GFR
AG Ratio: 1.8 (calc) (ref 1.0–2.5)
ALT: 33 U/L (ref 9–46)
AST: 38 U/L — ABNORMAL HIGH (ref 10–35)
Albumin: 4.1 g/dL (ref 3.6–5.1)
Alkaline phosphatase (APISO): 78 U/L (ref 35–144)
BUN/Creatinine Ratio: 28 (calc) — ABNORMAL HIGH (ref 6–22)
BUN: 30 mg/dL — ABNORMAL HIGH (ref 7–25)
CO2: 26 mmol/L (ref 20–32)
CREATININE: 1.09 mg/dL (ref 0.70–1.11)
Calcium: 9.8 mg/dL (ref 8.6–10.3)
Chloride: 103 mmol/L (ref 98–110)
GFR, Est African American: 70 mL/min/{1.73_m2} (ref >=60)
GFR, Est Non African American: 60 mL/min/{1.73_m2} (ref >=60)
GLOBULIN: 2.3 g/dL (ref 1.9–3.7)
Glucose, Bld: 98 mg/dL (ref 65–99)
POTASSIUM: 4.5 mmol/L (ref 3.5–5.3)
SODIUM: 139 mmol/L (ref 135–146)
Total Bilirubin: 0.7 mg/dL (ref 0.2–1.2)
Total Protein: 6.4 g/dL (ref 6.1–8.1)

## 2018-07-06 LAB — LIPID PANEL
Cholesterol: 196 mg/dL (ref <200)
HDL: 46 mg/dL (ref >=40)
LDL Cholesterol (Calc): 130 mg/dL (calc) — ABNORMAL HIGH
Non-HDL Cholesterol (Calc): 150 mg/dL (calc) — ABNORMAL HIGH (ref <130)
Total CHOL/HDL Ratio: 4.3 (calc) (ref <5.0)
Triglycerides: 102 mg/dL (ref <150)

## 2018-07-06 LAB — PSA: PSA: 0.3 ng/mL (ref <=4.0)

## 2018-07-11 ENCOUNTER — Telehealth: Payer: Self-pay | Admitting: Cardiovascular Disease

## 2018-07-11 NOTE — Telephone Encounter (Signed)
   Cardiac Questionnaire:    Since your last visit or hospitalization:    1. Have you been having new or worsening chest pain? No    2. Have you been having new or worsening shortness of breath? No 3. Have you been having new or worsening leg swelling, wt gain, or increase in abdominal girth (pants fitting more tightly)? No    4. Have you had any passing out spells? No    *A YES to any of these questions would result in the appointment being kept. *If all the answers to these questions are NO, we should indicate that given the current situation regarding the worldwide coronarvirus pandemic, at the recommendation of the CDC, we are looking to limit gatherings in our waiting area, and thus will reschedule their appointment beyond four weeks from today.   _____________    Per the patient and his wife, he is currently stable from a cardiac perspective. Mrs. Janssen notes that the patient lost about 15 lbs (presumably fluid) and had no appetite. His appetite is returning and he gaining a little weight every day.  I have advised that since he is stable at this time and due to [facility] guidelines regarding the COVID-19 virus, we will plan to cancel the patient's appointment for 3/31 with Christell Faith, PA.  I have asked that they call back at any time with acute weight gain they feel is related to fluid- he is currently without any swelling, or any change in his cardiac status.  Mrs. Shin voices understanding and is agreeable.    Routing to COVID-19 r/s pool.

## 2018-07-11 NOTE — Telephone Encounter (Signed)
Patient wife Clinton Joseph calling  Patient had an ECHO scheduled for 3/25 but is doing well so it has been cancelled at this time Patient has a follow up with R Dunn scheduled for 3/31 Please advise on appointment

## 2018-07-12 ENCOUNTER — Encounter: Payer: Medicare Other | Admitting: Family Medicine

## 2018-07-12 NOTE — Telephone Encounter (Signed)
Attempted to schedule evisit 3/31 Clinton Joseph.  Holding slot at this time.  No ans no vm .

## 2018-07-12 NOTE — Telephone Encounter (Signed)
Patient due 3/31 .  Will attempt to contact to schedule when available.

## 2018-07-13 ENCOUNTER — Other Ambulatory Visit: Payer: Medicare Other

## 2018-07-15 ENCOUNTER — Telehealth: Payer: Self-pay | Admitting: Cardiovascular Disease

## 2018-07-15 NOTE — Telephone Encounter (Signed)
Scheduled with [place] on Monday 3/30

## 2018-07-15 NOTE — Telephone Encounter (Signed)
Virtual Visit Pre-Appointment Phone Call  Steps For Call:  1. Confirm consent - "In the setting of the current Covid19 crisis, you are scheduled for a (phone or video) visit with your provider on (date) at (time).  Just as we do with many in-office visits, in order for you to participate in this visit, we must obtain consent.  If you'd like, I can send this to your mychart (if signed up) or email for you to review.  Otherwise, I can obtain your verbal consent now.  All virtual visits are billed to your insurance company just like a normal visit would be.  By agreeing to a virtual visit, we'd like you to understand that the technology does not allow for your provider to perform an examination, and thus may limit your provider's ability to fully assess your condition.  Finally, though the technology is pretty good, we cannot assure that it will always work on either your or our end, and in the setting of a video visit, we may have to convert it to a phone-only visit.  In either situation, we cannot ensure that we have a secure connection.  Are you willing to proceed?"  2. Give patient instructions for WebEx download to smartphone as below if video visit  3. Advise patient to be prepared with any vital sign or heart rhythm information, their current medicines, and a piece of paper and pen handy for any instructions they may receive the day of their visit  4. Inform patient they will receive a phone call 15 minutes prior to their appointment time (may be from unknown caller ID) so they should be prepared to answer  5. Confirm that appointment type is correct in Epic appointment notes (video vs telephone)    TELEPHONE CALL NOTE  Grayton Lobo has been deemed a candidate for a follow-up tele-health visit to limit community exposure during the Covid-19 pandemic. I spoke with the patient via phone to ensure availability of phone/video source, confirm preferred email & phone number, and  discuss instructions and expectations.  I reminded Konor Noren to be prepared with any vital sign and/or heart rhythm information that could potentially be obtained via home monitoring, at the time of his visit. I reminded Dawon Troop to expect a phone call at the time of his visit if his visit.  Did the patient verbally acknowledge consent to treatment? yes  Clarisse Gouge 07/15/2018 1:12 PM   DOWNLOADING THE [place], go to [employer] and type in WebEx in the search bar. [place] [employer], the blue/green circle. The app is free but as with any other app downloads, their phone may require them to verify saved payment information or Apple password. The patient does NOT have to create an account.  - If Android, ask patient to go to [employer] and type in WebEx in the search bar. [place] [employer], the blue/green circle. The app is free but as with any other app downloads, their phone may require them to verify saved payment information or Android password. The patient does NOT have to create an account.   CONSENT FOR TELE-HEALTH VISIT - PLEASE REVIEW  I hereby voluntarily request, consent and authorize [facility] and its employed or contracted physicians, physician assistants, nurse practitioners or other licensed health care professionals (the Practitioner), to provide me with telemedicine health care services (the Services") as deemed necessary by the treating Practitioner. I  acknowledge and consent to receive the Services by the Practitioner via telemedicine. I understand that the telemedicine visit will involve communicating with the Practitioner through live audiovisual communication technology and the disclosure of certain medical information by electronic transmission. I acknowledge that I have been given the opportunity to request an in-person assessment or other available alternative prior to the  telemedicine visit and am voluntarily participating in the telemedicine visit.  I understand that I have the right to withhold or withdraw my consent to the use of telemedicine in the course of my care at any time, without affecting my right to future care or treatment, and that the Practitioner or I may terminate the telemedicine visit at any time. I understand that I have the right to inspect all information obtained and/or recorded in the course of the telemedicine visit and may receive copies of available information for a reasonable fee.  I understand that some of the potential risks of receiving the Services via telemedicine include:   Delay or interruption in medical evaluation due to technological equipment failure or disruption;  Information transmitted may not be sufficient (e.g. poor resolution of images) to allow for appropriate medical decision making by the Practitioner; and/or   In rare instances, security protocols could fail, causing a breach of personal health information.  Furthermore, I acknowledge that it is my responsibility to provide information about my medical history, conditions and care that is complete and accurate to the best of my ability. I acknowledge that Practitioner's advice, recommendations, and/or decision may be based on factors not within their control, such as incomplete or inaccurate data provided by me or distortions of diagnostic images or specimens that may result from electronic transmissions. I understand that the practice of medicine is not an exact science and that Practitioner makes no warranties or guarantees regarding treatment outcomes. I acknowledge that I will receive a copy of this consent concurrently upon execution via email to the email address I last provided but may also request a printed copy by calling the office of [facility].    I understand that my insurance will be billed for this visit.   I have read or had this consent read to  me.  I understand the contents of this consent, which adequately explains the benefits and risks of the Services being provided via telemedicine.   I have been provided ample opportunity to ask questions regarding this consent and the Services and have had my questions answered to my satisfaction.  I give my informed consent for the services to be provided through the use of telemedicine in my medical care  By participating in this telemedicine visit I agree to the above.

## 2018-07-18 ENCOUNTER — Other Ambulatory Visit: Payer: Self-pay

## 2018-07-18 ENCOUNTER — Encounter: Payer: Self-pay | Admitting: Cardiovascular Disease

## 2018-07-18 ENCOUNTER — Telehealth (INDEPENDENT_AMBULATORY_CARE_PROVIDER_SITE_OTHER): Payer: Medicare Other | Admitting: Cardiovascular Disease

## 2018-07-18 VITALS — Ht 68.0 in | Wt 158.1 lb

## 2018-07-18 DIAGNOSIS — I442 Atrioventricular block, complete: Secondary | ICD-10-CM | POA: Diagnosis not present

## 2018-07-18 DIAGNOSIS — Z950 Presence of cardiac pacemaker: Secondary | ICD-10-CM | POA: Diagnosis not present

## 2018-07-18 DIAGNOSIS — I5031 Acute diastolic (congestive) heart failure: Secondary | ICD-10-CM

## 2018-07-18 DIAGNOSIS — I420 Dilated cardiomyopathy: Secondary | ICD-10-CM | POA: Diagnosis not present

## 2018-07-18 DIAGNOSIS — I483 Typical atrial flutter: Secondary | ICD-10-CM

## 2018-07-18 DIAGNOSIS — I10 Essential (primary) hypertension: Secondary | ICD-10-CM

## 2018-07-18 DIAGNOSIS — I4819 Other persistent atrial fibrillation: Secondary | ICD-10-CM | POA: Insufficient documentation

## 2018-07-18 DIAGNOSIS — I4892 Unspecified atrial flutter: Secondary | ICD-10-CM | POA: Insufficient documentation

## 2018-07-18 DIAGNOSIS — E782 Mixed hyperlipidemia: Secondary | ICD-10-CM

## 2018-07-18 NOTE — Progress Notes (Addendum)
Virtual Visit via Telephone Note   This visit type was conducted due to national recommendations for restrictions regarding the COVID-19 Pandemic (e.g. social distancing) in an effort to limit this patient's exposure and mitigate transmission in our community.  Due to her co-morbid illnesses, this patient is at least at moderate risk for complications without adequate follow up.  This format is felt to be most appropriate for this patient at this time.  The patient did not have access to video technology/had technical difficulties with video requiring transitioning to audio format only (telephone).  All issues noted in this document were discussed and addressed.  No physical exam could be performed with this format.  Please refer to the patient's chart for her  consent to telehealth for [facility].    Date:  07/18/2018   ID:  Clinton Joseph, DOB 04-02-1928, MRN [id number]  Patient Location:  Cl[place] [id number]   Provider location:   [facility], [place] office  PCP:  Olin Hauser, DO  Cardiologist:  Rockey Situ  Chief Complaint:  cardiomyopathy ,    History of Present Illness:    Clinton Joseph is a 85 y.o. male who presents via audio/video conferencing for a telehealth visit today.   The patient does not symptoms concerning for COVID-19 infection (fever, chills, cough, or new SHORTNESS OF BREATH).  Please refer to prior office visit for complete details: Patient has a past medical history of  Please refer to prior office visit for complete details.  Patient has a past medical history of: CAD lymphoma 2006 with chemotherapy x7 cycles at that time. Notes indicating run of VT,sustained with presentation to the emergency room in 2012. Rate was 150 beats per minute lasting more than 3 seconds. He was given IV amiodarone and converted to normal sinus rhythm. Evaluation at that time showing nonobstructive CAD, normal ejection  fraction. hypertension bradycardia, heart block in August 2015 requiring pacemaker Mobitz type II heart block on Eliquis status post briefly successful DCCV in 1/2020status post spontaneous pharmacological conversion in 08/5730 Chronic systolic CHF  hyperlipidemia  Ejection fraction 20% in the setting of atrial fibrillation/flutter He presents for follow-up of his CHF, atrial fibrillation seen on pacer  Seen by PMD 03/1618 Suspectedrecurrent flare of mixed anxiety / depression, prior history of mood disorder, now with worsening anxiety symptoms affecting his function, previously coped better Started onLexapro, caused SOB, stomach problem Stopped the pill Started onSertraline Stopped secondary to nausea   Pacer showing atrial fib,persistent CHADS VASC around 3, age, peripheral disease Atrial fib/flutter on last clinic visit in 03/2018 Started on eliquis 5 BID Seen by EP on 05/03/2018 with a weight of 175 pounds. Lasix increased from 20 up to 40, successful cardioversion May 09, 2018  Echocardiogram May 26, 2018 ejection fraction 20%, global hypokinesis, moderate to severely elevated right heart pressures 56 mmHg -Back in arrhythmia at the time of his echo  Started on carvedilol 6.25 twice daily amiodarone 400 twice a day 7 days then down to 200 mg twice a day Repeat cardioversion June 15, 2018 Improvement in his symptoms when seen in clinic May 31, 2018 Transition from losartan to [place]  On today's discussion, reports that he feels very well Legs are "normal", swelling better Walking, up to driveway, 3 times a day Leg strength slow to recover but improving Eating well  BP 100/55, pulse 60, measured today No orthostasis symptoms  No SOB,  Weight: lost 15 pounds Weight now 158.6, goal  weight Down 17 pounds since 03/2018  Continues on amiodarone  Date Total Chol. LDL HBA1C Glucose Creatine PSA  04/2018    105 1.36   06/2018 196 130 6.0 98  1.09 0.3   The patient denies chest pain, SOB, headache, dizziness or any other related symptoms at this time.  Prior CV studies:   The following studies were reviewed today:  Prior cardiac catheterization aug 2015 showed 40% proximal RCA disease, minimal mid left circumflex disease normal ejection fraction at that time,  Prior echocardiogram showing normal LV systolic function, otherwise normal study prior EKGs and 2012 showed normal sinus rhythm with right bundle branch block, left anterior fascicular block  Recent echocardiogram February 2020  1. The left ventricle has severely reduced systolic function < 38%. The cavity size was mildly dilated. There is no increased left ventricular wall thickness. Left ventricular diastology could not be evaluated due to nondiagnostic images. Left ventrical  global hypokinesis, unable to exclude regional wall motion abnormality.  2. The right ventricle has mildly reduced systolic function. The cavity was mildly enlarged. There is no increase in right ventricular wall thickness. Right ventricular systolic pressure is moderately elevated with an estimated pressure of 56.6 mmHg.  3. Left atrial size was moderately dilated.  4. Pacing wire/catheter visualized in the right ventricle.  5. Right atrial size was mildly dilated.  6. Mitral valve regurgitation is moderate to severe by color flow Doppler.  7. Tricuspid valve regurgitation is moderate-severe.  8. The inferior vena cava was dilated in size with <50% respiratory variability.   Past Medical History:  Diagnosis Date  . Anxiety state, unspecified   . Arthritis    a. Hands.  . Atrial flutter ([place])    a. Dx 03/2018-->Eliquis 5 BID started (CHA2DS2VASc = 5).  . Diffuse large B cell lymphoma ([place])    a. 2007 s/p chemo - followed by Dr. Jeb Levering.  . H/O echocardiogram    a. 11/2013 Echo: EF 55-60%, mildly dil LA, mild to mod Ao sclerosis.  . Hyperlipidemia   . Hypertensive heart disease   . Mobitz  II    a. 11/2013 syncope-->s/p SJM Assurity DR model [id number] (ser # [id number]).  . Non-obstructive CAD    a. 10/2010 Cath: >M nl, LAD/LCX min irregs, RCA 40p, EF 60%.  . Tremor    a. right hand  . Urticaria, unspecified   . Ventricular tachycardia (HCC)    a. 2012->broke with amio, neg w/u.   Past Surgical History:  Procedure Laterality Date  . APPENDECTOMY  1948  . CARDIAC CATHETERIZATION  11/17/2010   [facility]- LAD min irregs, LCX min irregs, RCA 40p, EF 60%.   . CARDIOVERSION N/A 05/09/2018   Procedure: CARDIOVERSION (CATH LAB);  Surgeon: Minna Merritts, MD;  Location: [facility] ORS;  Service: Cardiovascular;  Laterality: N/A;  . CATARACT EXTRACTION, BILATERAL Bilateral ~ 2012  . INSERT / REPLACE / REMOVE PACEMAKER  12/05/2013  . PERMANENT PACEMAKER INSERTION N/A 12/05/2013   Procedure: PERMANENT PACEMAKER INSERTION;  Surgeon: Coralyn Mark, MD;  Location: [facility] CATH LAB;  Service: Cardiovascular;  Laterality: N/A;     Current Meds  Medication Sig  . acetaminophen (TYLENOL) 650 MG CR tablet Take 1,300 mg by mouth daily as needed.   Marland Kitchen amiodarone (PACERONE) 200 MG tablet Take 200 mg by mouth daily.  Marland Kitchen apixaban (ELIQUIS) 5 MG TABS tablet Take 1 tablet (5 mg total) by mouth 2 (two) times daily.  . carvedilol (COREG) 6.25 MG tablet Take 1 tablet (  6.25 mg total) by mouth 2 (two) times daily.  . cetirizine (ZYRTEC) 10 MG tablet Take 10 mg by mouth daily as needed.   . cholecalciferol (VITAMIN D3) 25 MCG (1000 UT) tablet Take 1,000 Units by mouth 2 (two) times daily.  . furosemide (LASIX) 20 MG tablet Take 1 tablet (20 mg) by mouth once every other day  . Multiple Vitamins-Minerals (MULTIVITAMIN WITH MINERALS) tablet Take 1 tablet by mouth daily. Centrum Silver  . mupirocin ointment (BACTROBAN) 2 % Apply 1 application topically 2 (two) times daily. For 7-10 days to finger (Patient taking differently: Apply 1 application topically 2 (two) times daily as needed. For 7-10 days to finger)  . Omega-3 Fatty  Acids (FISH OIL) 1000 MG CAPS Take 1,000-2,000 mg by mouth See admin instructions. Take 2 capsules (2000 mg) by mouth in the morninig & 1 capsule (1000 mg) by mouth night.  . sacubitril-valsartan (ENTRESTO) 24-26 MG Take 1 tablet by mouth 2 (two) times daily.  . vitamin B-12 (CYANOCOBALAMIN) 1000 MCG tablet Take 1,000 mcg by mouth daily.  . [DISCONTINUED] amiodarone (PACERONE) 200 MG tablet Take 1 tablet (200 mg) by mouth two times a day through March 7, then take 1 tablet (200 mg) by mouth once a day starting 3/8 and ongoing. (Patient taking differently: Take 200 mg by mouth daily. )     Allergies:   Keflex [cephalexin]; Penicillins; and Tape   Social History   Tobacco Use  . Smoking status: Never Smoker  . Smokeless tobacco: Never Used  Substance Use Topics  . Alcohol use: Yes    Alcohol/week: 2.0 standard drinks    Types: 2 Glasses of wine per week  . Drug use: No     Current Outpatient Medications on File Prior to Visit  Medication Sig Dispense Refill  . acetaminophen (TYLENOL) 650 MG CR tablet Take 1,300 mg by mouth daily as needed.     Marland Kitchen amiodarone (PACERONE) 200 MG tablet Take 200 mg by mouth daily.    Marland Kitchen apixaban (ELIQUIS) 5 MG TABS tablet Take 1 tablet (5 mg total) by mouth 2 (two) times daily. 60 tablet 6  . carvedilol (COREG) 6.25 MG tablet Take 1 tablet (6.25 mg total) by mouth 2 (two) times daily. 180 tablet 3  . cetirizine (ZYRTEC) 10 MG tablet Take 10 mg by mouth daily as needed.     . cholecalciferol (VITAMIN D3) 25 MCG (1000 UT) tablet Take 1,000 Units by mouth 2 (two) times daily.    . furosemide (LASIX) 20 MG tablet Take 1 tablet (20 mg) by mouth once every other day    . Multiple Vitamins-Minerals (MULTIVITAMIN WITH MINERALS) tablet Take 1 tablet by mouth daily. Centrum Silver    . mupirocin ointment (BACTROBAN) 2 % Apply 1 application topically 2 (two) times daily. For 7-10 days to finger (Patient taking differently: Apply 1 application topically 2 (two) times daily  as needed. For 7-10 days to finger) 22 g 0  . Omega-3 Fatty Acids (FISH OIL) 1000 MG CAPS Take 1,000-2,000 mg by mouth See admin instructions. Take 2 capsules (2000 mg) by mouth in the morninig & 1 capsule (1000 mg) by mouth night.    . sacubitril-valsartan (ENTRESTO) 24-26 MG Take 1 tablet by mouth 2 (two) times daily. 180 tablet 2  . vitamin B-12 (CYANOCOBALAMIN) 1000 MCG tablet Take 1,000 mcg by mouth daily.     No current facility-administered medications on file prior to visit.      Family Hx: The patient's family  history includes Coronary artery disease in his mother; Emphysema in his father.  ROS:   Please see the history of present illness.    Review of Systems  Constitutional: Negative.   Respiratory: Negative.   Cardiovascular: Negative.   Gastrointestinal: Negative.   Musculoskeletal: Negative.   Neurological: Negative.   Psychiatric/Behavioral: Negative.   All other systems reviewed and are negative.    Labs/Other Tests and Data Reviewed:    Recent Labs: 05/31/2018: TSH 2.890 07/05/2018: ALT 33; BUN 30; Creat 1.09; Hemoglobin 13.8; Platelets 168; Potassium 4.5; Sodium 139   Recent Lipid Panel Lab Results  Component Value Date/Time   CHOL 196 07/05/2018 08:25 AM   TRIG 102 07/05/2018 08:25 AM   HDL 46 07/05/2018 08:25 AM   CHOLHDL 4.3 07/05/2018 08:25 AM   LDLCALC 130 (H) 07/05/2018 08:25 AM    Wt Readings from Last 3 Encounters:  07/18/18 158 lb 2 oz (71.7 kg)  06/15/18 168 lb (76.2 kg)  06/14/18 167 lb 12 oz (76.1 kg)     Exam:    Vital Signs:  Ht 5\' 8"  (1.727 m)   Wt 158 lb 2 oz (71.7 kg)   BMI 24.04 kg/m    Well nourished, well developed male in no acute distress.   ASSESSMENT & PLAN:    Chronic systolic CHF Low ejection fraction in the setting of arrhythmia, fibrillation flutter Recommended he continue current medications  Persistent atrial fibrillation By symptoms suspect he is holding normal sinus rhythm Continue Coreg and amiodarone  Tolerating anticoagulation  Atrial flutter, unspecified type ([place]) As above continue current medications  Essential hypertension Blood pressure low but no orthostasis symptoms No changes made They will monitor orthostatic blood pressures at home  Mixed hyperlipidemia Not currently on a statin Will discuss with him in follow-up in the clinic  Cardiac pacemaker in situ Followed by EP   COVID-19 Education: The signs and symptoms of COVID-19 were discussed with the patient and how to seek care for testing (follow up with PCP or arrange E-visit).  The importance of social distancing was discussed today.  Time:   Today, I have spent 25 minutes with the patient with telehealth technology discussing arrhythmia, cardiomyopathy, medication titration, CHF management, low blood pressure.     Medication Adjustments/Labs and Tests Ordered: Current medicines are reviewed at length with the patient today.  Concerns regarding medicines are outlined above.   Tests Ordered: No tests ordered   Medication Changes: No changes made   Disposition: Follow-up in 3 months   Signed, Ida Rogue, MD  07/18/2018 11:39 AM    [place] [facility] [address] [place] [address], [place], [place] [address]

## 2018-07-18 NOTE — Patient Instructions (Addendum)
Call the office for weight <155  Medication Instructions:  No changes  If you need a refill on your cardiac medications before your next appointment, please call your pharmacy.    Lab work: No new labs needed   If you have labs (blood work) drawn today and your tests are completely normal, you will receive your results only by: Marland Kitchen MyChart Message (if you have MyChart) OR . A paper copy in the mail If you have any lab test that is abnormal or we need to change your treatment, we will call you to review the results.   Testing/Procedures: No new testing needed   Follow-Up: At [facility], you and your health needs are our priority.  As part of our continuing mission to provide you with exceptional heart care, we have created designated Provider Care Teams.  These Care Teams include your primary Cardiologist (physician) and Advanced Practice Providers (APPs -  Physician Assistants and Nurse Practitioners) who all work together to provide you with the care you need, when you need it.  . You will need a follow up appointment in 3 months .   Please call our office 2 months in advance to schedule this appointment.    . Providers on your designated Care Team:   . Murray Hodgkins, NP . Christell Faith, PA-C . Marrianne Mood, PA-C  Any Other Special Instructions Will Be Listed Below (If Applicable).  For educational health videos Log in to : [email or web address] Or : [email or web address], password : [email or web address]

## 2018-07-19 ENCOUNTER — Ambulatory Visit: Payer: Medicare Other | Admitting: Physician Assistant

## 2018-07-28 ENCOUNTER — Telehealth: Payer: Self-pay | Admitting: Cardiovascular Disease

## 2018-07-28 MED ORDER — CARVEDILOL 6.25 MG PO TABS: 3.1250 mg | ORAL_TABLET | Freq: Two times a day (BID) | ORAL | 3 refills | Status: DC

## 2018-07-28 NOTE — Telephone Encounter (Signed)
Please have her decrease her dosage of Carvedilol to (1/2 pill) or 3.125mg  twice daily, as this drop may be in the setting of recently starting Entresto. Continue to take BP measurements. Call if this does not improve. Make sure she is taking BP 2 hours after medications.

## 2018-07-28 NOTE — Telephone Encounter (Signed)
Pt c/o BP issue: STAT if pt c/o blurred vision, one-sided weakness or slurred speech  1. What are your last 5 BP readings?  TODAY 90/49  YESTERDAY 104/53 4/7-124/60 4/6-103/54  2. Are you having any other symptoms (ex. Dizziness, headache, blurred vision, passed out)? OTHERWISE FEELS FINE.   3. What is your BP issue? LOW

## 2018-07-28 NOTE — Telephone Encounter (Signed)
Spoke with patient's wife and she verbalized understanding to decrease carvedilol to 3.125 mg (1/2 tablet) by mouth two times a day. They will continue to monitor BP about 2 hours after medications. Med list updated.

## 2018-07-28 NOTE — Telephone Encounter (Signed)
Returned call to patient. He asked me to speak to his wife. She reports BP has been trending down over the past few days despite feeling well, eating well and sleeping well. BP this am 90/49, HR 60.  Last visit was a telephone with Dr. Rockey Situ on 3/30, BP was 100/55, HR 60. No med changes were made at that time. Current meds; carvedilol 6.25 twice daily, amiodarone 200 mg twice a day.  Pt denies dizziness with standing. See thread of BP trend this week.   Routed to provider to assess for any meds changes, if needed.

## 2018-09-13 ENCOUNTER — Ambulatory Visit: Payer: Medicare Other | Admitting: Family Medicine

## 2018-09-14 ENCOUNTER — Telehealth: Payer: Self-pay | Admitting: Cardiovascular Disease

## 2018-09-14 NOTE — Telephone Encounter (Signed)
Please call regarding Entresto and Eliquis. States pt is in the donut hole and has some questions.Marland Kitchen

## 2018-09-14 NOTE — Telephone Encounter (Signed)
I spoke with [name]. She states that [name] is in the donut hole with his eliquis and entresto. This was $400 + for a 3 month supply just for eliquis.   She was asking if there was a coupon that could be used. I advised for Medicare patient's once the 30-day free card is used when initially prescribed we can use those again for the patient.  She is aware I will check with one of our nurses working in the office to see if we have any eliqui 5 mg or entresto 24/26 mg tablets that we can give to him   She is aware we will call her back tomorrow.

## 2018-09-15 ENCOUNTER — Ambulatory Visit (INDEPENDENT_AMBULATORY_CARE_PROVIDER_SITE_OTHER): Payer: Medicare Other | Admitting: *Deleted

## 2018-09-15 DIAGNOSIS — I442 Atrioventricular block, complete: Secondary | ICD-10-CM | POA: Diagnosis not present

## 2018-09-15 LAB — CUP PACEART REMOTE DEVICE CHECK
Battery Remaining Longevity: 108 mo
Battery Remaining Percentage: 95 %
Battery Voltage: 3.01 V
Brady Statistic RA Percent Paced: 85 %
Brady Statistic RV Percent Paced: 100 %
Date Time Interrogation Session: 20200528201138
Implantable Lead Implant Date: 20150818
Implantable Lead Implant Date: 20150818
Implantable Lead Location: 753859
Implantable Lead Location: 753860
Implantable Lead Model: 1948
Implantable Pulse Generator Implant Date: 20150818
Lead Channel Impedance Value: 390 Ohm
Lead Channel Impedance Value: 390 Ohm
Lead Channel Impedance Value: 760 Ohm
Lead Channel Sensing Intrinsic Amplitude: 5 mV
Lead Channel Setting Pacing Amplitude: 2 V
Lead Channel Setting Pacing Amplitude: 2.5 V
Lead Channel Setting Pacing Pulse Width: 0.5 ms
Lead Channel Setting Sensing Sensitivity: 4 mV
Pulse Gen Model: 2240
Pulse Gen Serial Number: 7664739

## 2018-09-15 NOTE — Telephone Encounter (Signed)
Per Iva, RN- she has pulled 1 box each of samples for [name] of entresto/ eliquis.  I have spoken with the patient and he is aware that the samples have been placed at the front desk for him for pick up.  He is aware when coming to obtain these, he will need to come through the [facility] entrance instead of [facility].

## 2018-09-21 ENCOUNTER — Encounter: Payer: Self-pay | Admitting: Cardiology

## 2018-09-21 NOTE — Progress Notes (Signed)
Remote pacemaker transmission.   

## 2018-09-26 ENCOUNTER — Telehealth: Payer: Self-pay | Admitting: Cardiovascular Disease

## 2018-09-26 NOTE — Telephone Encounter (Signed)
Patient spouse calling States patient had just received a new prescription for a 3 month supply for [employer] they have lost the medicine and are unable to find  Would like to know what they can do to request more - for [facility]ill [facility] [place]  Please call to discuss

## 2018-09-26 NOTE — Telephone Encounter (Signed)
Contacted [facility] and spoke with [place]. Clinton Joseph sts that they will be able to request an early refill from the pt insurance. The pt would be have to pay the his out of pocket cost of $416 for a months supply. Clinton Joseph will try to locate a manufacturer coupon if applicable which may bring the cost down.   Spoke with the pt wife. Clinton Joseph sts that they accidentally discarded the pt entresto Rx. Adv the pt wife of my conversation with [facility]. The pt was given entresto last night but he is currently out of the medication. Adv the pt wife to go ahead and contact [facility] so that can refill the medication. Pt wife verbalized understanding and voiced appreciation for the assistance.

## 2018-10-04 ENCOUNTER — Encounter: Payer: Self-pay | Admitting: Family Medicine

## 2018-10-04 ENCOUNTER — Ambulatory Visit (INDEPENDENT_AMBULATORY_CARE_PROVIDER_SITE_OTHER): Payer: Medicare Other | Admitting: Family Medicine

## 2018-10-04 ENCOUNTER — Other Ambulatory Visit: Payer: Self-pay

## 2018-10-04 VITALS — BP 134/88 | HR 62 | Temp 98.0°F | Resp 16 | Ht 68.0 in | Wt 165.5 lb

## 2018-10-04 DIAGNOSIS — R110 Nausea: Secondary | ICD-10-CM | POA: Diagnosis not present

## 2018-10-04 DIAGNOSIS — R413 Other amnesia: Secondary | ICD-10-CM

## 2018-10-04 MED ORDER — ONDANSETRON 4 MG PO TBDP: 4.0000 mg | ORAL_TABLET | Freq: Three times a day (TID) | ORAL | 2 refills | Status: DC | PRN

## 2018-10-04 NOTE — Patient Instructions (Addendum)
Thank you for coming to the office today.  I think your nausea is caused by Amiodarone (Pacerone) - this is very common cause of nausea - Please discuss with Dr Rockey Situ at next visit. - Sent new rx for Zofran pill for as needed if nausea - Try to avoid empty stomach, can keep taking Ensure to help if you is working  For memory loss, I recommend to keep doing activities, games, cards, puzzles, and try to keep staying active with walking daily as much as possible. Physical and mental stimulation can help reduce memory loss.  Memory supplement medications are not safe to mix with blood thinner and heart medicines currently.  If memory loss is worsening or causing significant problems - then we can revisit and consider stronger rx medication but these are only for dementia.  Please schedule a Follow-up Appointment to: Return in about 3 months (around 01/04/2019) for Re-schedule for Annual Physical.  If you have any other questions or concerns, please feel free to call the office or send a message through [facility]. You may also schedule an earlier appointment if necessary.  Additionally, you may be receiving a survey about your experience at our office within a few days to 1 week by e-mail or mail. We value your feedback.  Nobie Putnam, DO [place]

## 2018-10-04 NOTE — Progress Notes (Signed)
Subjective:    Patient ID: Clinton Joseph, male    DOB: March 28, 1928, 85 y.o.   MRN: [id number]  Clinton Joseph is a 85 y.o. male presenting on 10/04/2018 for Nausea and Memory Loss  He was scheduled for Physical back in March 2020, but it was cancelled / rescheduled.  HPI   Nausea, without vomiting Reports episodes of nausea for while now, weeks to months, unsure onset, worse with empty stomach, not having any vomiting. Improved with Ensure. He does not have any nausea medication, has tried zofran in the past. - Taking Amiodarone currently per Cardiology, with known side effect of nausea, thinks may be worse since on this medication - Next apt with Cardiology within 1-2 weeks - Denies abdominal pain, diarrhea, fever chills, vomiting, dizziness  Memory Loss Reports chronic gradual problem of some declining memory. He says his wife wanted him to ask about any supplement or medication for memory loss that would be safe for him. Denies any abnormal behavior, violence, aggressive behavior, agitation, depression, insomnia  Depression screen [facility] 2/9 10/04/2018 06/15/2018 04/04/2018  Decreased Interest 0 0 1  Down, Depressed, Hopeless 0 0 3  PHQ - 2 Score 0 0 4  Altered sleeping 0 - 0  Tired, decreased energy 1 - 0  Change in appetite 0 - 0  Feeling bad or failure about yourself  0 - 3  Trouble concentrating 0 - 0  Moving slowly or fidgety/restless 0 - 0  Suicidal thoughts 0 - 0  PHQ-9 Score 1 - 7  Difficult doing work/chores Not difficult at all - Not difficult at all    Past Medical History:  Diagnosis Date  . Anxiety state, unspecified   . Arthritis    a. Hands.  . Atrial flutter ([place])    a. Dx 03/2018-->Eliquis 5 BID started (CHA2DS2VASc = 5).  . Diffuse large B cell lymphoma ([place])    a. 2007 s/p chemo - followed by Dr. Jeb Levering.  . H/O echocardiogram    a. 11/2013 Echo: EF 55-60%, mildly dil LA, mild to mod Ao sclerosis.  . Hyperlipidemia   . Hypertensive heart  disease   . Mobitz II    a. 11/2013 syncope-->s/p SJM Assurity DR model [id number] (ser # [id number]).  . Non-obstructive CAD    a. 10/2010 Cath: >M nl, LAD/LCX min irregs, RCA 40p, EF 60%.  . Tremor    a. right hand  . Urticaria, unspecified   . Ventricular tachycardia (HCC)    a. 2012->broke with amio, neg w/u.   Past Surgical History:  Procedure Laterality Date  . APPENDECTOMY  1948  . CARDIAC CATHETERIZATION  11/17/2010   [facility]- LAD min irregs, LCX min irregs, RCA 40p, EF 60%.   . CARDIOVERSION N/A 05/09/2018   Procedure: CARDIOVERSION (CATH LAB);  Surgeon: Minna Merritts, MD;  Location: [facility] ORS;  Service: Cardiovascular;  Laterality: N/A;  . CATARACT EXTRACTION, BILATERAL Bilateral ~ 2012  . INSERT / REPLACE / REMOVE PACEMAKER  12/05/2013  . PERMANENT PACEMAKER INSERTION N/A 12/05/2013   Procedure: PERMANENT PACEMAKER INSERTION;  Surgeon: Coralyn Mark, MD;  Location: [place] CATH LAB;  Service: Cardiovascular;  Laterality: N/A;   Social History   Socioeconomic History  . Marital status: Married    Spouse name: Bonnita Nasuti   . Number of children: 1  . Years of education: Not on file  . Highest education level: Some college, no degree  Occupational History  . 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  Financial resource strain: Not hard at all  . Food insecurity    Worry: Never true    Inability: Never true  . Transportation needs    Medical: No    Non-medical: No  Tobacco Use  . Smoking status: Never Smoker  . Smokeless tobacco: Never Used  Substance and Sexual Activity  . Alcohol use: Yes    Alcohol/week: 2.0 standard drinks    Types: 2 Glasses of wine per week  . Drug use: No  . Sexual activity: Yes  Lifestyle  . Physical activity    Days per week: 0 days    Minutes per session: 0 min  . Stress: Not at all  Relationships  . Social Herbalist on phone: Twice a week    Gets together: More than three times a week    Attends religious service:  More than 4 times per year    Active member of club or organization: No    Attends meetings of clubs or organizations: Never    Relationship status: Married  . Intimate partner violence    Fear of current or ex partner: No    Emotionally abused: No    Physically abused: No    Forced sexual activity: No  Other Topics Concern  . Not on file  Social History Narrative  . Not on file   Family History  Problem Relation Age of Onset  . Coronary artery disease Mother   . Emphysema Father    Current Outpatient Medications on File Prior to Visit  Medication Sig  . acetaminophen (TYLENOL) 650 MG CR tablet Take 1,300 mg by mouth daily as needed.   Marland Kitchen amiodarone (PACERONE) 200 MG tablet Take 200 mg by mouth daily.  Marland Kitchen apixaban (ELIQUIS) 5 MG TABS tablet Take 1 tablet (5 mg total) by mouth 2 (two) times daily.  . carvedilol (COREG) 6.25 MG tablet Take 0.5 tablets (3.125 mg total) by mouth 2 (two) times daily.  . cetirizine (ZYRTEC) 10 MG tablet Take 10 mg by mouth daily as needed.   . cholecalciferol (VITAMIN D3) 25 MCG (1000 UT) tablet Take 1,000 Units by mouth 2 (two) times daily.  . furosemide (LASIX) 20 MG tablet Take 1 tablet (20 mg) by mouth once every other day  . Multiple Vitamins-Minerals (MULTIVITAMIN WITH MINERALS) tablet Take 1 tablet by mouth daily. Centrum Silver  . mupirocin ointment (BACTROBAN) 2 % Apply 1 application topically 2 (two) times daily. For 7-10 days to finger (Patient taking differently: Apply 1 application topically 2 (two) times daily as needed. For 7-10 days to finger)  . Omega-3 Fatty Acids (FISH OIL) 1000 MG CAPS Take 1,000-2,000 mg by mouth See admin instructions. Take 2 capsules (2000 mg) by mouth in the morninig & 1 capsule (1000 mg) by mouth night.  . sacubitril-valsartan (ENTRESTO) 24-26 MG Take 1 tablet by mouth 2 (two) times daily.  . vitamin B-12 (CYANOCOBALAMIN) 1000 MCG tablet Take 1,000 mcg by mouth daily.   No current facility-administered medications  on file prior to visit.     Review of Systems Per HPI unless specifically indicated above     Objective:    BP 134/88 (BP Location: Left Arm, Cuff Size: Normal)   Pulse 62   Temp 98 F (36.7 C) (Oral)   Resp 16   Ht 5\' 8"  (1.727 m)   Wt 165 lb 8 oz (75.1 kg)   BMI 25.16 kg/m   Wt Readings from Last 3 Encounters:  10/04/18 165  lb 8 oz (75.1 kg)  07/18/18 158 lb 2 oz (71.7 kg)  06/15/18 168 lb (76.2 kg)    Physical Exam Results for orders placed or performed in visit on 09/15/18  CUP PACEART REMOTE DEVICE CHECK  Result Value Ref Range   Pulse Generator Manufacturer [employer]    Date Time Interrogation Session [id number]    Pulse Gen Model 2240 Assurity    Pulse Gen Serial Number [id number]    Clinic Name [facility] Pulse Generator Type Implantable Pulse Generator    Implantable Pulse Generator Implant Date [id number]    Implantable Lead Manufacturer [place] IsoFlex Optim    Implantable Lead Serial Number [id number]    Implantable Lead Implant Date [id number]    Implantable Lead Location Detail 1 UNKNOWN    Implantable Lead Location [id number]    Implantable Lead Manufacturer [facility]    Implantable Lead Model [id number] Tendril STS    Implantable Lead Serial Number [id number]    Implantable Lead Implant Date [id number]    Implantable Lead Location Detail 1 UNKNOWN    Implantable Lead Location [id number]    Lead Channel Setting Sensing Sensitivity 4.0 mV   Lead Channel Setting Sensing Adaptation Mode Fixed Pacing    Lead Channel Setting Pacing Amplitude 2.0 V   Lead Channel Setting Pacing Pulse Width 0.5 ms   Lead Channel Setting Pacing Amplitude 2.5 V   Lead Channel Impedance Value 390 ohm   Lead Channel Sensing Intrinsic Amplitude 5 mV   Lead Channel Impedance Value 390 ohm   Lead Channel Impedance Value 760 ohm   Battery Remaining Longevity 108 mo   Battery Remaining Percentage 95 %   Battery Voltage 3.01 V   Brady Statistic RA Percent  Paced 85 %   Brady Statistic RV Percent Paced 100 %      Assessment & Plan:   Problem List Items Addressed This Visit    None    Visit Diagnoses    Nausea    -  Primary Suspected nausea secondary to medication side effect on Amiodarone Advised him to discuss with Cardiology at next apt coming up Try to avoid empty stomach, seems to make symptoms worse, can use Ensure supplement if need Rx Zofran ODT PRN use, may not be needed in future    Relevant Medications   ondansetron (ZOFRAN ODT) 4 MG disintegrating tablet   Memory loss   Clinically seems mild and current functional problem with forgetfulness and memory loss without behavior disturbances. - Reassurance given, no proven effective supplement at this time, other than standard multivitamin and regular medication. My caution would be with his blood thinner and other rx medications if took other herbal supplements for memory - Does not seem to meet criteria for stronger dementia medication without that diagnosis now, in future can reconsider MMSE testing and evaluate      Meds ordered this encounter  Medications  . ondansetron (ZOFRAN ODT) 4 MG disintegrating tablet    Sig: Take 1 tablet (4 mg total) by mouth every 8 (eight) hours as needed for nausea or vomiting.    Dispense:  30 tablet    Refill:  2     Follow up plan: Return in about 3 months (around 01/04/2019) for Re-schedule for Annual Physical.  Nobie Putnam, DO [facility] 10/04/2018, 9:32 AM

## 2018-10-13 ENCOUNTER — Telehealth: Payer: Self-pay | Admitting: Cardiovascular Disease

## 2018-10-13 NOTE — Telephone Encounter (Signed)

## 2018-10-14 ENCOUNTER — Ambulatory Visit (INDEPENDENT_AMBULATORY_CARE_PROVIDER_SITE_OTHER): Payer: Medicare Other

## 2018-10-14 ENCOUNTER — Other Ambulatory Visit: Payer: Self-pay

## 2018-10-14 DIAGNOSIS — I4819 Other persistent atrial fibrillation: Secondary | ICD-10-CM

## 2018-10-17 ENCOUNTER — Telehealth: Payer: Medicare Other | Admitting: Cardiovascular Disease

## 2018-10-23 NOTE — Progress Notes (Signed)
Virtual Visit via Telephone Note   This visit type was conducted due to national recommendations for restrictions regarding the COVID-19 Pandemic (e.g. social distancing) in an effort to limit this patient's exposure and mitigate transmission in our community.  Due to her co-morbid illnesses, this patient is at least at moderate risk for complications without adequate follow up.  This format is felt to be most appropriate for this patient at this time.  The patient did not have access to video technology/had technical difficulties with video requiring transitioning to audio format only (telephone).  All issues noted in this document were discussed and addressed.  No physical exam could be performed with this format.  Please refer to the patient's chart for her  consent to telehealth for [facility].    Date:  10/24/2018   ID:  Clinton Joseph, DOB Dec 08, 1927, MRN [id number]  Patient Location:  [place] [address]   Provider location:   [facility], [place] office  PCP:  Olin Hauser, DO  Cardiologist:  Rockey Situ  Chief Complaint:  cardiomyopathy    History of Present Illness:    Clinton Joseph is a 85 y.o. male who presents via audio/video conferencing for a telehealth visit today.   The patient does not symptoms concerning for COVID-19 infection (fever, chills, cough, or new SHORTNESS OF BREATH).  Please refer to prior office visit for complete details: Patient has a past medical history of  Please refer to prior office visit for complete details.  Patient has a past medical history of: CAD lymphoma 2006 with chemotherapy x7 cycles at that time. Notes indicating run of VT,sustained with presentation to the emergency room in 2012. Rate was 150 bpm.   given IV amiodarone, converted to NSR nonobstructive CAD, normal ejection fraction. hypertension bradycardia, heart block in August 2015 requiring pacemaker Mobitz type II heart  block on Eliquis status post briefly successful DCCV in 1/2020status post spontaneous pharmacological conversion in 07/2704 Chronic systolic CHF  hyperlipidemia  Ejection fraction 20% in the setting of atrial fibrillation/flutter 05/2018 successful cardioversion May 09, 2018 Repeat cardioversion June 15, 2018, on amiodarone He presents for follow-up of his CHF, atrial fibrillation seen on pacer  On his last clinic visit March 2020, Legs are "normal", swelling better Walking, up to driveway, 3 times a day Leg strength  improving Uses stationary bike, walks outside  Echocardiogram October 14, 2018 ejection fraction 45 to 50%  No chest pain, no SOB, no leg edema Weight stable, weights daily BP stable, but low  No orthostasis symptoms   Seen by PMD 03/1618 Suspectedrecurrent flare of mixed anxiety / depression, prior history of mood disorder, now with worsening anxiety symptoms affecting his function Started onLexapro, caused SOB, stomach problem Stopped the pill Started onSertraline Stopped secondary to nausea  Pacer showing atrial fib,persistent CHADS VASC around 3, age, peripheral disease Atrial fib/flutter on last clinic visit in 03/2018 Started on eliquis 5 BID Seen by EP on 05/03/2018 ,  weight of 175 pounds. Lasix increased from 20 up to 40, successful cardioversion May 09, 2018  Echocardiogram May 26, 2018 ejection fraction 20%, global hypokinesis, moderate to severely elevated right heart pressures 56 mmHg   Prior CV studies:   The following studies were reviewed today:  Prior cardiac catheterization aug 2015 showed 40% proximal RCA disease, minimal mid left circumflex disease normal ejection fraction at that time,  Prior echocardiogram showing normal LV systolic function, otherwise normal study prior EKGs and 2012 showed  normal sinus rhythm with right bundle branch block, left anterior fascicular block  Recent echocardiogram February 2020  1. The  left ventricle has severely reduced systolic function < 09%. The cavity size was mildly dilated. There is no increased left ventricular wall thickness. Left ventricular diastology could not be evaluated due to nondiagnostic images. Left ventrical  global hypokinesis, unable to exclude regional wall motion abnormality.  2. The right ventricle has mildly reduced systolic function. The cavity was mildly enlarged. There is no increase in right ventricular wall thickness. Right ventricular systolic pressure is moderately elevated with an estimated pressure of 56.6 mmHg.  3. Left atrial size was moderately dilated.  4. Pacing wire/catheter visualized in the right ventricle.  5. Right atrial size was mildly dilated.  6. Mitral valve regurgitation is moderate to severe by color flow Doppler.  7. Tricuspid valve regurgitation is moderate-severe.  8. The inferior vena cava was dilated in size with <50% respiratory variability.   Past Medical History:  Diagnosis Date  . Anxiety state, unspecified   . Arthritis    a. Hands.  . Atrial flutter ([place])    a. Dx 03/2018-->Eliquis 5 BID started (CHA2DS2VASc = 5).  . Diffuse large B cell lymphoma ([place])    a. 2007 s/p chemo - followed by Dr. Jeb Levering.  . H/O echocardiogram    a. 11/2013 Echo: EF 55-60%, mildly dil LA, mild to mod Ao sclerosis.  . Hyperlipidemia   . Hypertensive heart disease   . Mobitz II    a. 11/2013 syncope-->s/p SJM Assurity DR model [id number] (ser # [id number]).  . Non-obstructive CAD    a. 10/2010 Cath: >M nl, LAD/LCX min irregs, RCA 40p, EF 60%.  . Tremor    a. right hand  . Urticaria, unspecified   . Ventricular tachycardia (HCC)    a. 2012->broke with amio, neg w/u.   Past Surgical History:  Procedure Laterality Date  . APPENDECTOMY  1948  . CARDIAC CATHETERIZATION  11/17/2010   [facility]- LAD min irregs, LCX min irregs, RCA 40p, EF 60%.   . CARDIOVERSION N/A 05/09/2018   Procedure: CARDIOVERSION (CATH LAB);  Surgeon: Minna Merritts,  MD;  Location: [facility] ORS;  Service: Cardiovascular;  Laterality: N/A;  . CATARACT EXTRACTION, BILATERAL Bilateral ~ 2012  . INSERT / REPLACE / REMOVE PACEMAKER  12/05/2013  . PERMANENT PACEMAKER INSERTION N/A 12/05/2013   Procedure: PERMANENT PACEMAKER INSERTION;  Surgeon: Coralyn Mark, MD;  Location: [place] CATH LAB;  Service: Cardiovascular;  Laterality: N/A;     No outpatient medications have been marked as taking for the 10/24/18 encounter (Appointment) with Minna Merritts, MD.     Allergies:   Keflex [cephalexin], Penicillins, and Tape   Social History   Tobacco Use  . Smoking status: Never Smoker  . Smokeless tobacco: Never Used  Substance Use Topics  . Alcohol use: Yes    Alcohol/week: 2.0 standard drinks    Types: 2 Glasses of wine per week  . Drug use: No     Current Outpatient Medications on File Prior to Visit  Medication Sig Dispense Refill  . acetaminophen (TYLENOL) 650 MG CR tablet Take 1,300 mg by mouth daily as needed.     Marland Kitchen amiodarone (PACERONE) 200 MG tablet Take 200 mg by mouth daily.    Marland Kitchen apixaban (ELIQUIS) 5 MG TABS tablet Take 1 tablet (5 mg total) by mouth 2 (two) times daily. 60 tablet 6  . carvedilol (COREG) 6.25 MG tablet Take 0.5 tablets (3.125 mg total)  by mouth 2 (two) times daily. 180 tablet 3  . cetirizine (ZYRTEC) 10 MG tablet Take 10 mg by mouth daily as needed.     . cholecalciferol (VITAMIN D3) 25 MCG (1000 UT) tablet Take 1,000 Units by mouth 2 (two) times daily.    . furosemide (LASIX) 20 MG tablet Take 1 tablet (20 mg) by mouth once every other day    . Multiple Vitamins-Minerals (MULTIVITAMIN WITH MINERALS) tablet Take 1 tablet by mouth daily. Centrum Silver    . mupirocin ointment (BACTROBAN) 2 % Apply 1 application topically 2 (two) times daily. For 7-10 days to finger (Patient taking differently: Apply 1 application topically 2 (two) times daily as needed. For 7-10 days to finger) 22 g 0  . Omega-3 Fatty Acids (FISH OIL) 1000 MG CAPS Take  1,000-2,000 mg by mouth See admin instructions. Take 2 capsules (2000 mg) by mouth in the morninig & 1 capsule (1000 mg) by mouth night.    . ondansetron (ZOFRAN ODT) 4 MG disintegrating tablet Take 1 tablet (4 mg total) by mouth every 8 (eight) hours as needed for nausea or vomiting. 30 tablet 2  . sacubitril-valsartan (ENTRESTO) 24-26 MG Take 1 tablet by mouth 2 (two) times daily. 180 tablet 2  . vitamin B-12 (CYANOCOBALAMIN) 1000 MCG tablet Take 1,000 mcg by mouth daily.     No current facility-administered medications on file prior to visit.      Family Hx: The patient's family history includes Coronary artery disease in his mother; Emphysema in his father.  ROS:   Please see the history of present illness.    Review of Systems  Constitutional: Negative.   Respiratory: Negative.   Cardiovascular: Negative.   Gastrointestinal: Negative.   Musculoskeletal: Negative.        Leg weakness  Neurological: Negative.   Psychiatric/Behavioral: Negative.   All other systems reviewed and are negative.    Labs/Other Tests and Data Reviewed:    Recent Labs: 05/31/2018: TSH 2.890 07/05/2018: ALT 33; BUN 30; Creat 1.09; Hemoglobin 13.8; Platelets 168; Potassium 4.5; Sodium 139   Recent Lipid Panel Lab Results  Component Value Date/Time   CHOL 196 07/05/2018 08:25 AM   TRIG 102 07/05/2018 08:25 AM   HDL 46 07/05/2018 08:25 AM   CHOLHDL 4.3 07/05/2018 08:25 AM   LDLCALC 130 (H) 07/05/2018 08:25 AM    Wt Readings from Last 3 Encounters:  10/04/18 165 lb 8 oz (75.1 kg)  07/18/18 158 lb 2 oz (71.7 kg)  06/15/18 168 lb (76.2 kg)     Exam:    Vital Signs:   Weight 165.8 BP: 115/52,  Pulse 60 resp 16  Well nourished, well developed male in no acute distress.   ASSESSMENT & PLAN:    Chronic systolic CHF Low ejection fraction in the setting of arrhythmia, fibrillation flutter Recommended he continue current medications Dramatic improvement in ejection fraction performed this  past month up to 45 to 50% Asymptomatic  Persistent atrial fibrillation  suspect he is holding normal sinus rhythm Continue Coreg and amiodarone Tolerating anticoagulation Ejection fraction does better by maintaining normal sinus rhythm  Atrial flutter, unspecified type ([place]) Prior cardioversions early 2020 We will continue amiodarone and anticoagulation  Essential hypertension Blood pressure low but no orthostasis symptoms Talk to patient's wife, she will check orthostatics at home He does not drink much fluid, if blood pressure does drop will need to decrease Lasix dosing, increase his fluid intake  Mixed hyperlipidemia Not currently on a statin We will  discussed with him in follow-up  Cardiac pacemaker in situ Followed by EP   COVID-19 Education: The signs and symptoms of COVID-19 were discussed with the patient and how to seek care for testing (follow up with PCP or arrange E-visit).  The importance of social distancing was discussed today.  Time:   Today, I have spent 25 minutes with the patient with telehealth technology discussing arrhythmia, cardiomyopathy, medication titration, CHF management, low blood pressure.     Medication Adjustments/Labs and Tests Ordered: Current medicines are reviewed at length with the patient today.  Concerns regarding medicines are outlined above.   Tests Ordered: No tests ordered   Medication Changes: No changes made   Disposition: Follow-up in 6 months   Signed, Ida Rogue, MD  10/24/2018 11:13 AM    [place] Office [address] [place] [address], [place], [place] [address]

## 2018-10-24 ENCOUNTER — Telehealth (INDEPENDENT_AMBULATORY_CARE_PROVIDER_SITE_OTHER): Payer: Medicare Other | Admitting: Cardiovascular Disease

## 2018-10-24 ENCOUNTER — Other Ambulatory Visit: Payer: Self-pay

## 2018-10-24 DIAGNOSIS — I4892 Unspecified atrial flutter: Secondary | ICD-10-CM | POA: Diagnosis not present

## 2018-10-24 DIAGNOSIS — Z950 Presence of cardiac pacemaker: Secondary | ICD-10-CM | POA: Diagnosis not present

## 2018-10-24 DIAGNOSIS — I5031 Acute diastolic (congestive) heart failure: Secondary | ICD-10-CM | POA: Diagnosis not present

## 2018-10-24 DIAGNOSIS — I442 Atrioventricular block, complete: Secondary | ICD-10-CM

## 2018-10-24 DIAGNOSIS — E782 Mixed hyperlipidemia: Secondary | ICD-10-CM

## 2018-10-24 DIAGNOSIS — I10 Essential (primary) hypertension: Secondary | ICD-10-CM

## 2018-10-24 DIAGNOSIS — I420 Dilated cardiomyopathy: Secondary | ICD-10-CM

## 2018-10-24 DIAGNOSIS — I4819 Other persistent atrial fibrillation: Secondary | ICD-10-CM

## 2018-10-24 NOTE — Patient Instructions (Signed)

## 2018-11-14 ENCOUNTER — Telehealth: Payer: Self-pay | Admitting: Cardiovascular Disease

## 2018-11-14 NOTE — Telephone Encounter (Signed)
Spoke with wife, ok per DPR. This morning, patient felt weak in his legs.  Initial BP was 88/50, 90/50 (sitting), standing went up to 100/59, Pulse is always 60-62. Denied having any lighteheadedness or shortness of breath. States he is feeling fine now.  Had her take his BP while on the phone, 136/58, HR 60. No complaints at this time. 7/6 115/52, 60 7/7 132/61, 60 7/11 141/55, 60 7/20 103/57, 60  Readings are sporadic. She says they usually take them about 2 hours after BP meds in the morning, though not everyday. I asked if he drink water and stays hydrated.  She admitted he does not drink as mush as he should. He forgets and she has to remind him and she sometimes forgets to herself.  He had not had much to drink the past several days.  Advised to have him drink plenty of water and maybe a Gatorade during the day as well. Advised them to continue to monitor BP/HR over the next few days and call us back with the readings on Thursday. She verbalized understanding.

## 2018-11-14 NOTE — Telephone Encounter (Signed)
Patient advised to call in if he had BP issues and a medication change would be considered.  Pt c/o BP issue: STAT if pt c/o blurred vision, one-sided weakness or slurred speech  1. What are your last 5 BP readings? 88/50, 90/50 both sitting. 100-59 standing this morning  2. Are you having any other symptoms (ex. Dizziness, headache, blurred vision, passed out)? Weak in legs this am, fine now  3. What is your BP issue? Low BP readings this morning  Please advise

## 2018-11-21 ENCOUNTER — Telehealth: Payer: Self-pay | Admitting: Cardiovascular Disease

## 2018-11-21 NOTE — Telephone Encounter (Signed)
Patients wife calling in with BP readings and weights   7/27 100/59    Weight  165.2 7/28 126/55     Weight 166.6 7/29 113/54  Weight  165.4 7/30 124/56  Weight  164.4 7/31 106/54  Weight  166 8/1 104/54  Weight  167.2  8/2 unable  8/3 188/54  Weight  166.6

## 2018-11-21 NOTE — Telephone Encounter (Signed)
Wife calling in to give update on blood pressures. Pt said he has been feeling good and has not had any lower readings since last week.   These reading are to give Dr. Rockey Situ an update as an FYI.

## 2018-11-25 NOTE — Telephone Encounter (Signed)
Would watch for now, thx Continue current meds

## 2018-12-01 ENCOUNTER — Other Ambulatory Visit: Payer: Self-pay | Admitting: Physician Assistant

## 2018-12-05 ENCOUNTER — Other Ambulatory Visit: Payer: Self-pay | Admitting: Cardiovascular Disease

## 2018-12-05 MED ORDER — CARVEDILOL 3.125 MG PO TABS: 3.1250 mg | ORAL_TABLET | Freq: Two times a day (BID) | ORAL | 1 refills | Status: DC

## 2018-12-05 NOTE — Telephone Encounter (Signed)
Please review for refill. Thank you! 

## 2018-12-05 NOTE — Telephone Encounter (Signed)
Requested Prescriptions   Signed Prescriptions Disp Refills  . carvedilol (COREG) 3.125 MG tablet 180 tablet 1    Sig: Take 1 tablet (3.125 mg total) by mouth 2 (two) times daily.    Authorizing Provider: Minna Merritts    Ordering User: Raelene Bott, BRANDY L

## 2018-12-05 NOTE — Telephone Encounter (Signed)
Pt's age 85, wt 75.1 kg, SCr 1.09, CrCl 49.76, last ov w/ TG 10/24/18.

## 2018-12-05 NOTE — Telephone Encounter (Signed)
°*  STAT* If patient is at the pharmacy, call can be transferred to refill team.   1. Which medications need to be refilled? (please list name of each medication and dose if known) carvedilol - has to cut 6.25 MG in half - would like to know if they can get 3.125 MG filled   2. Which pharmacy/location (including street and city if local pharmacy) is medication to be sent to?  [facility] [place]   3. Do they need a 30 day or 90 day supply? 90 day

## 2018-12-15 ENCOUNTER — Ambulatory Visit (INDEPENDENT_AMBULATORY_CARE_PROVIDER_SITE_OTHER): Payer: Medicare Other | Admitting: *Deleted

## 2018-12-15 DIAGNOSIS — I442 Atrioventricular block, complete: Secondary | ICD-10-CM

## 2018-12-15 DIAGNOSIS — I5031 Acute diastolic (congestive) heart failure: Secondary | ICD-10-CM

## 2018-12-15 LAB — CUP PACEART REMOTE DEVICE CHECK
Battery Remaining Longevity: 109 mo
Battery Remaining Percentage: 95.5 %
Battery Voltage: 2.99 V
Brady Statistic AP VP Percent: 76 %
Brady Statistic AP VS Percent: 1 %
Brady Statistic AS VP Percent: 24 %
Brady Statistic AS VS Percent: 1 %
Brady Statistic RA Percent Paced: 76 %
Brady Statistic RV Percent Paced: 99 %
Date Time Interrogation Session: 20200826060012
Implantable Lead Implant Date: 20150818
Implantable Lead Implant Date: 20150818
Implantable Lead Location: 753859
Implantable Lead Location: 753860
Implantable Lead Model: 1948
Implantable Pulse Generator Implant Date: 20150818
Lead Channel Impedance Value: 390 Ohm
Lead Channel Impedance Value: 760 Ohm
Lead Channel Sensing Intrinsic Amplitude: 4.2 mV
Lead Channel Setting Pacing Amplitude: 2 V
Lead Channel Setting Pacing Amplitude: 2.5 V
Lead Channel Setting Pacing Pulse Width: 0.5 ms
Lead Channel Setting Sensing Sensitivity: 4 mV
Pulse Gen Model: 2240
Pulse Gen Serial Number: 7664739

## 2018-12-20 NOTE — Progress Notes (Signed)
Remote pacemaker transmission.   

## 2019-01-10 ENCOUNTER — Ambulatory Visit (INDEPENDENT_AMBULATORY_CARE_PROVIDER_SITE_OTHER): Payer: Medicare Other | Admitting: Family Medicine

## 2019-01-10 ENCOUNTER — Other Ambulatory Visit: Payer: Self-pay

## 2019-01-10 ENCOUNTER — Encounter: Payer: Self-pay | Admitting: Family Medicine

## 2019-01-10 VITALS — BP 147/45 | HR 59 | Temp 98.1°F | Resp 14 | Ht 68.0 in | Wt 173.6 lb

## 2019-01-10 DIAGNOSIS — R29898 Other symptoms and signs involving the musculoskeletal system: Secondary | ICD-10-CM | POA: Diagnosis not present

## 2019-01-10 DIAGNOSIS — R2681 Unsteadiness on feet: Secondary | ICD-10-CM | POA: Diagnosis not present

## 2019-01-10 DIAGNOSIS — M150 Primary generalized (osteo)arthritis: Secondary | ICD-10-CM | POA: Diagnosis not present

## 2019-01-10 DIAGNOSIS — M159 Polyosteoarthritis, unspecified: Secondary | ICD-10-CM

## 2019-01-10 NOTE — Patient Instructions (Addendum)
Thank you for coming to the office today.  Referral to [facility] Physical Therapy - they will contact you with an appointment to proceed. Schedule when you are ready. To help with leg strength, pain and weakness.  Return for Flu shot when ready  Recommend to start taking Tylenol Extra Strength 500mg  tabs - take 1 to 2 tabs per dose (max 1000mg ) every 6-8 hours for pain (take regularly, don't skip a dose for next 7 days), max 24 hour daily dose is 6 tablets or 3000mg . In the future you can repeat the same everyday Tylenol course for 1-2 weeks at a time.    Please schedule a Follow-up Appointment to: Return in about 6 weeks (around 02/21/2019), or if symptoms worsen or fail to improve, for 6 week follow-up weakness / PT, then 6 months for Annual Physical.  If you have any other questions or concerns, please feel free to call the office or send a message through [place]. You may also schedule an earlier appointment if necessary.  Additionally, you may be receiving a survey about your experience at our office within a few days to 1 week by e-mail or mail. We value your feedback.  Nobie Putnam, DO [place]

## 2019-01-10 NOTE — Progress Notes (Signed)
Subjective:    Patient ID: Clinton Joseph, male    DOB: [birth year], 85 y.o.   MRN: [id number]  Clinton Joseph is a 85 y.o. male presenting on 01/10/2019 for Leg Pain (need referral for PT)  Today he changed mind, instead of annual physical he plans to discuss more acute medical concern and will re-schedule physical.  HPI   Bilateral Leg Pain / Osteoarthritis / Unsteady Gait Reports new concern over past few months. He describes difficulty with ambulation due to some weakness and difficulty coordinating balance. Admits unsteady - Taking Tylenol-Arthritis strength 650mg  x 2 at night to help with leg pain, can be worse at night Denies any injury or fall or sprain, numbness tingling weakness, joint swelling or redness   Health Maintenance: He declines flu vaccine today, will return.  Depression screen [facility] 2/9 01/10/2019 10/04/2018 06/15/2018  Decreased Interest 0 0 0  Down, Depressed, Hopeless 0 0 0  PHQ - 2 Score 0 0 0  Altered sleeping 0 0 -  Tired, decreased energy 0 1 -  Change in appetite 0 0 -  Feeling bad or failure about yourself  0 0 -  Trouble concentrating 0 0 -  Moving slowly or fidgety/restless 0 0 -  Suicidal thoughts 0 0 -  PHQ-9 Score 0 1 -  Difficult doing work/chores Not difficult at all Not difficult at all -    Past Medical History:  Diagnosis Date   Anxiety state, unspecified    Arthritis    a. Hands.   Atrial flutter ([place])    a. Dx 03/2018-->Eliquis 5 BID started (CHA2DS2VASc = 5).   Diffuse large B cell lymphoma ([place])    a. 2007 s/p chemo - followed by Dr. Jeb Levering.   H/O echocardiogram    a. 11/2013 Echo: EF 55-60%, mildly dil LA, mild to mod Ao sclerosis.   Hyperlipidemia    Hypertensive heart disease    Mobitz II    a. 11/2013 syncope-->s/p SJM Assurity DR model [id number] (ser # [id number]).   Non-obstructive CAD    a. 10/2010 Cath: >M nl, LAD/LCX min irregs, RCA 40p, EF 60%.   Tremor    a. right hand   Urticaria, unspecified     Ventricular tachycardia (HCC)    a. 2012->broke with amio, neg w/u.   Past Surgical History:  Procedure Laterality Date   APPENDECTOMY  1948   CARDIAC CATHETERIZATION  11/17/2010   [facility]- LAD min irregs, LCX min irregs, RCA 40p, EF 60%.    CARDIOVERSION N/A 05/09/2018   Procedure: CARDIOVERSION (CATH LAB);  Surgeon: Minna Merritts, MD;  Location: [facility] ORS;  Service: Cardiovascular;  Laterality: N/A;   CATARACT EXTRACTION, BILATERAL Bilateral ~ 2012   INSERT / REPLACE / REMOVE PACEMAKER  12/05/2013   PERMANENT PACEMAKER INSERTION N/A 12/05/2013   Procedure: PERMANENT PACEMAKER INSERTION;  Surgeon: Coralyn Mark, MD;  Location: [facility] CATH LAB;  Service: Cardiovascular;  Laterality: N/A;   Social History   Socioeconomic History   Marital status: Married    Spouse name: Bonnita Nasuti    Number of children: 1   Years of education: Not on file   Highest education level: Some college, no degree  Occupational History   Occupation: Optometrist    Comment: retired  Scientist, product/process development strain: Not hard at [employer]    Worry: Never true    Inability: Never true   Transportation needs    Medical: No    Non-medical:  No  Tobacco Use   Smoking status: Never Smoker   Smokeless tobacco: Never Used  Substance and Sexual Activity   Alcohol use: Yes    Alcohol/week: 2.0 standard drinks    Types: 2 Glasses of wine per week   Drug use: No   Sexual activity: Yes  Lifestyle   Physical activity    Days per week: 0 days    Minutes per session: 0 min   Stress: Not at all  Relationships   Social connections    Talks on phone: Twice a week    Gets together: More than three times a week    Attends religious service: More than 4 times per year    Active member of club or organization: No    Attends meetings of clubs or organizations: Never    Relationship status: Married   Intimate partner violence    Fear of current or ex partner: No    Emotionally  abused: No    Physically abused: No    Forced sexual activity: No  Other Topics Concern   Not on file  Social History Narrative   Not on file   Family History  Problem Relation Age of Onset   Coronary artery disease Mother    Emphysema Father    Current Outpatient Medications on File Prior to Visit  Medication Sig   acetaminophen (TYLENOL) 650 MG CR tablet Take 1,300 mg by mouth daily as needed.    amiodarone (PACERONE) 200 MG tablet Take 200 mg by mouth daily.   carvedilol (COREG) 3.125 MG tablet Take 1 tablet (3.125 mg total) by mouth 2 (two) times daily.   cetirizine (ZYRTEC) 10 MG tablet Take 10 mg by mouth daily as needed.    cholecalciferol (VITAMIN D3) 25 MCG (1000 UT) tablet Take 1,000 Units by mouth 2 (two) times daily.   ELIQUIS 5 MG TABS tablet TAKE 1 TABLET BY MOUTH TWICE A DAY   ENTRESTO 24-26 MG TAKE 1 TABLET BY MOUTH TWICE A DAY   furosemide (LASIX) 20 MG tablet Take 1 tablet (20 mg) by mouth once every other day   Multiple Vitamins-Minerals (MULTIVITAMIN WITH MINERALS) tablet Take 1 tablet by mouth daily. Centrum Silver   mupirocin ointment (BACTROBAN) 2 % Apply 1 application topically 2 (two) times daily. For 7-10 days to finger (Patient taking differently: Apply 1 application topically 2 (two) times daily as needed. For 7-10 days to finger)   Omega-3 Fatty Acids (FISH OIL) 1000 MG CAPS Take 1,000-2,000 mg by mouth See admin instructions. Take 2 capsules (2000 mg) by mouth in the morninig & 1 capsule (1000 mg) by mouth night.   ondansetron (ZOFRAN ODT) 4 MG disintegrating tablet Take 1 tablet (4 mg total) by mouth every 8 (eight) hours as needed for nausea or vomiting.   vitamin B-12 (CYANOCOBALAMIN) 1000 MCG tablet Take 1,000 mcg by mouth daily.   No current facility-administered medications on file prior to visit.     Review of Systems Per HPI unless specifically indicated above  \   Objective:    BP (!) 147/45 (BP Location: Left Arm, Patient  Position: Sitting, Cuff Size: Normal)    Pulse (!) 59    Temp 98.1 F (36.7 C) (Oral)    Resp 14    Ht 5\' 8"  (1.727 m)    Wt 173 lb 9.6 oz (78.7 kg)    SpO2 99%    BMI 26.40 kg/m   Wt Readings from Last 3 Encounters:  01/10/19 173  lb 9.6 oz (78.7 kg)  10/04/18 165 lb 8 oz (75.1 kg)  07/18/18 158 lb 2 oz (71.7 kg)    Physical Exam Vitals signs and nursing note reviewed.  Constitutional:      General: He is not in acute distress.    Appearance: He is well-developed. He is not diaphoretic.     Comments: Well-appearing, comfortable, cooperative  HENT:     Head: Normocephalic and atraumatic.  Eyes:     General:        Right eye: No discharge.        Left eye: No discharge.     Conjunctiva/sclera: Conjunctivae normal.  Cardiovascular:     Rate and Rhythm: Normal rate.  Pulmonary:     Effort: Pulmonary effort is normal.  Musculoskeletal:     Comments: Bilateral lower extremity - Hip flex / knee flex extend - symmetrical with mostly intact strength 4/5. Mostly preserved range of motion.  Knees non tender, no effusion  Skin:    General: Skin is warm and dry.     Findings: No erythema or rash.  Neurological:     Mental Status: He is alert and oriented to person, place, and time.  Psychiatric:        Behavior: Behavior normal.     Comments: Well groomed, good eye contact, normal speech and thoughts    Results for orders placed or performed in visit on 12/15/18  CUP PACEART REMOTE DEVICE CHECK  Result Value Ref Range   Date Time Interrogation Session [id number]    Pulse Generator Manufacturer [employer]    Pulse Gen Model 2240 Assurity    Pulse Gen Serial Number [id number]    Clinic Name [facility] Pulse Generator Type Implantable Pulse Generator    Implantable Pulse Generator Implant Date [id number]    Implantable Lead Manufacturer [place] IsoFlex Optim    Implantable Lead Serial Number [id number]    Implantable Lead Implant Date  [id number]    Implantable Lead Location Detail 1 UNKNOWN    Implantable Lead Location [id number]    Implantable Lead Manufacturer [facility]    Implantable Lead Model [id number] Tendril STS    Implantable Lead Serial Number [id number]    Implantable Lead Implant Date [id number]    Implantable Lead Location Detail 1 UNKNOWN    Implantable Lead Location [id number]    Lead Channel Setting Sensing Sensitivity 4.0 mV   Lead Channel Setting Sensing Adaptation Mode Fixed Pacing    Lead Channel Setting Pacing Amplitude 2.0 V   Lead Channel Setting Pacing Pulse Width 0.5 ms   Lead Channel Setting Pacing Amplitude 2.5 V   Lead Channel Status     Lead Channel Impedance Value 390 ohm   Lead Channel Sensing Intrinsic Amplitude 4.2 mV   Lead Channel Status     Lead Channel Impedance Value 760 ohm   Battery Status MOS    Battery Remaining Longevity 109 mo   Battery Remaining Percentage 95.5 %   Battery Voltage 2.99 V   Brady Statistic RA Percent Paced 76.0 %   Brady Statistic RV Percent Paced 99.0 %   Brady Statistic AP VP Percent 76.0 %   Brady Statistic AS VP Percent 24.0 %   Brady Statistic AP VS Percent 1.0 %   Brady Statistic AS VS Percent 1.0 %      Assessment & Plan:   Problem List Items Addressed This Visit    None  Visit Diagnoses    Bilateral leg weakness    -  Primary   Relevant Orders   Ambulatory referral to Physical Therapy   Primary osteoarthritis involving multiple joints       Relevant Orders   Ambulatory referral to Physical Therapy   Unsteady gait       Relevant Orders   Ambulatory referral to Physical Therapy      Clinically with constellation of symptoms with weakness in lower extremity, causing or contributing to unsteady gait.  Order [facility] PT ambulatory, to be scheduled Continue Tylenol - can increase dosing as advised Follow up  No orders of the defined types were placed in this encounter.  Orders Placed This Encounter  Procedures   Ambulatory referral to  Physical Therapy    Referral Priority:   Routine    Referral Type:   Physical Medicine    Referral Reason:   Specialty Services Required    Requested Specialty:   Physical Therapy    Number of Visits Requested:   1    Follow up plan: Return in about 6 weeks (around 02/21/2019), or if symptoms worsen or fail to improve, for 6 week follow-up weakness / PT, then 6 months for Annual Physical.  Nobie Putnam, DO [facility] 01/10/2019, 1:49 PM

## 2019-01-24 ENCOUNTER — Ambulatory Visit: Payer: Medicare Other

## 2019-02-01 ENCOUNTER — Other Ambulatory Visit: Payer: Self-pay

## 2019-02-01 ENCOUNTER — Ambulatory Visit: Payer: Medicare Other | Attending: Family Medicine

## 2019-02-01 DIAGNOSIS — M25561 Pain in right knee: Secondary | ICD-10-CM | POA: Diagnosis not present

## 2019-02-01 DIAGNOSIS — M25562 Pain in left knee: Secondary | ICD-10-CM | POA: Diagnosis not present

## 2019-02-01 DIAGNOSIS — R262 Difficulty in walking, not elsewhere classified: Secondary | ICD-10-CM | POA: Insufficient documentation

## 2019-02-01 DIAGNOSIS — R2681 Unsteadiness on feet: Secondary | ICD-10-CM

## 2019-02-01 DIAGNOSIS — M6281 Muscle weakness (generalized): Secondary | ICD-10-CM | POA: Diagnosis not present

## 2019-02-01 NOTE — Therapy (Signed)
[facility] [address] [place], [address] Phone: [phone]   Fax:  [phone]  Physical Therapy Evaluation  Patient Details  Name: Clinton Joseph MRN: [id number] Date of Birth: 1928-02-22 Referring Provider (PT): Nobie Putnam, DO   Encounter Date: 02/01/2019  PT End of Session - 02/01/19 1559    Visit Number  1    Number of Visits  17    Date for PT Re-Evaluation  03/30/19    Authorization Type  1    Authorization Time Period  of 10 Medicare    PT Start Time  1559    PT Stop Time  1659    PT Time Calculation (min)  60 min    Equipment Utilized During Treatment  Gait belt    Activity Tolerance  Patient tolerated treatment well    Behavior During Therapy  [facility] for tasks assessed/performed       Past Medical History:  Diagnosis Date  . Anxiety state, unspecified   . Arthritis    a. Hands.  . Atrial flutter ([place])    a. Dx 03/2018-->Eliquis 5 BID started (CHA2DS2VASc = 5).  . Diffuse large B cell lymphoma ([place])    a. 2007 s/p chemo - followed by Dr. Jeb Levering.  . H/O echocardiogram    a. 11/2013 Echo: EF 55-60%, mildly dil LA, mild to mod Ao sclerosis.  . Hyperlipidemia   . Hypertensive heart disease   . Mobitz II    a. 11/2013 syncope-->s/p SJM Assurity DR model [id number] (ser # [id number]).  . Non-obstructive CAD    a. 10/2010 Cath: >M nl, LAD/LCX min irregs, RCA 40p, EF 60%.  . Tremor    a. right hand  . Urticaria, unspecified   . Ventricular tachycardia (HCC)    a. 2012->broke with amio, neg w/u.    Past Surgical History:  Procedure Laterality Date  . APPENDECTOMY  1948  . CARDIAC CATHETERIZATION  11/17/2010   [facility]- LAD min irregs, LCX min irregs, RCA 40p, EF 60%.   . CARDIOVERSION N/A 05/09/2018   Procedure: CARDIOVERSION (CATH LAB);  Surgeon: Minna Merritts, MD;  Location: [facility] ORS;  Service: Cardiovascular;  Laterality: N/A;  . CATARACT EXTRACTION, BILATERAL Bilateral ~  2012  . INSERT / REPLACE / REMOVE PACEMAKER  12/05/2013  . PERMANENT PACEMAKER INSERTION N/A 12/05/2013   Procedure: PERMANENT PACEMAKER INSERTION;  Surgeon: Coralyn Mark, MD;  Location: [place] CATH LAB;  Service: Cardiovascular;  Laterality: N/A;    There were no vitals filed for this visit.   Subjective Assessment - 02/01/19 1612    Subjective  No pain or discomfort currently or in the past 2 months.    Pertinent History  B leg weakness, unsteady gait. Pt has been having difficulty walking for over 2 months ago. Pt currently ambulating with SPC since then. Gradual onset. Pt states having problems with his heart a while ago. Ever since he started having problems with his heart, pt starting having difficulty walking but started using his cane 2 months ago.  Both legs feel equally weak. Pt feels pulling at times at his R and L lateral knee (around lateral hamstrings). Using a heating pad helps. Currently has a pacemaker as well.    Patient Stated Goals  Walk without the cane.    Currently in Pain?  No/denies         [facility] PT Assessment - 02/01/19 1601      Assessment   Medical Diagnosis  Bilateral leg weakness, primary osteoarthritis involving multiple joints, unsteady gait    Referring Provider (PT)  Nobie Putnam, DO    Onset Date/Surgical Date  11/19/18   Symptoms began about 2 months ago; no specific date given   Prior Therapy  no known PT for current condition      Precautions   Precaution Comments  Fall risk; pacemaker      Restrictions   Other Position/Activity Restrictions  pacemaker      Balance Screen   Has the patient fallen in the past 6 months  No    Has the patient had a decrease in activity level because of a fear of falling?   No    Is the patient reluctant to leave their home because of a fear of falling?   No      Home Environment   Additional Comments  Pt in a 3 story home (seldom goes to 3rd floor) with wife. No steps front door. 2 steps at the [place].  5 steps at the garage B rails. 7 steps to get to 2nd floor, B rails. 7 steps to go to 3rd floor, B rail       Prior Function   Vocation  Retired    [employer]  PLOF: less difficulty and unsteadiness with gait.       Posture/Postural Control   Posture Comments  decreased bilateral hip extension, decreased lumbar extension, bilaterally protracted shoulders, kyphosis      AROM   Lumbar Flexion  WFL    Lumbar Extension  limited    Lumbar - Right Side Bend  limited    Lumbar - Left Side Bend  limited    Lumbar - Right Rotation  WFL    Lumbar - Left Rotation  [facility]      Strength   Right Hip Flexion  4/5    Right Hip Extension  3+/5   seated manually resisted   Right Hip ABduction  4-/5   seated manually resisted clamshell isometric   Left Hip Flexion  4/5    Left Hip Extension  3+/5   seated manually resisted   Left Hip ABduction  4-/5   seated manually resisted clamshell isometric   Right Knee Flexion  4+/5    Right Knee Extension  4+/5    Left Knee Flexion  4+/5    Left Knee Extension  4+/5      Palpation   Palpation comment  no TTP knee joint.       Ambulation/Gait   Gait Comments  ambulates wiht SPC on R. Backward lean, increased base of support, decreased bilateral hip extension, decreased step length/decreased stride length.       Berg Balance Test   Sit to Stand  Able to stand  independently using hands    Standing Unsupported  Able to stand safely 2 minutes    Sitting with Back Unsupported but Feet Supported on Floor or Stool  Able to sit safely and securely 2 minutes    Stand to Sit  Controls descent by using hands    Transfers  Able to transfer safely, definite need of hands    Standing Unsupported with Eyes Closed  Able to stand 10 seconds safely    Standing Unsupported with Feet Together  Able to place feet together independently and stand for 1 minute with supervision   B lateral knee pulling/weakness/reproduced weakness   From Standing, Reach  Forward with Outstretched Arm  Can reach  forward >5 cm safely (2")    From Standing Position, Pick up Object from [place] to pick up shoe safely and easily    From Standing Position, Turn to Look Behind Over each Shoulder  Looks behind from both sides and weight shifts well    Turn 360 Degrees  Able to turn 360 degrees safely but slowly    Standing Unsupported, Alternately Place Feet on Step/Stool  Able to complete >2 steps/needs minimal assist   B lateral knee pain,    Standing Unsupported, One Foot in Front  Needs help to step but can hold 15 seconds    Standing on One Leg  Unable to try or needs assist to prevent fall    Total Score  38                Objective measurements completed on examination: See above findings.   (no pain) 0,1,2,3,4,5,6,7,8,9,10 (worst pain)    No latex band allergies  Blood pressure controlled per pt.   PACEMAKER  Blood pressure, L arm sitting, mechanically taken, normal cuff: 164/54, HR 60  (-) calf squeeze, no abnormal warmth, swelling, redness B LE. Pt states having a sedentary lifestyle  Pain at lateral hamstrings tendon bilaterally per pt.    Standing with feet together: Pt states feeling bilateral lateral knee joint discomfort which makes his LE feel weak and unsteady. Reproduced the weakness he feels with his difficulty with balance during gait.      Bilateral lateral knee 4/10  Pain currently, 6/10 B lateral knee pain at most for the past 6 months   Pt states feeling tired for exercises after eval.   Try isometric hamstrings loading at 40% effort next visit if appropriate.       Patient is an 85 year old male who came to physical therapy secondary to unsteady gait. He also presents with bilateral lateral knee pain along the lateral hamstrings tendon, poor posture, altered gait pattern, bilateral hip weakness, decreased balance based on Berg Balance test score, decreased activity tolerance, and difficulty performing  functional tasks such as gait. Pt will benefit from skilled physical therapy services to address the aforementioned deficits.      PT Education - 02/01/19 1836    Education Details  plan of care    Person(s) Educated  Patient    Methods  Explanation    Comprehension  Verbalized understanding       PT Short Term Goals - 02/01/19 1812      PT SHORT TERM GOAL #1   Title  Pt will be independent with his HEP to decrease B knee pain, improve balance and decrease difficulty with gait.    Time  3    Period  Weeks    Status  New    Target Date  02/23/19        PT Long Term Goals - 02/01/19 1813      PT LONG TERM GOAL #1   Title  Patient will have a decrease in B knee pain to 3/10 or less at worst to promote ability to amblate more steadily and decrease need for AD.    Baseline  6/10 bilateral lateral knee pain at most for the past 6 months (02/01/2019)    Time  8    Period  Weeks    Status  New    Target Date  03/30/19      PT LONG TERM GOAL #2   Title  Patient will improve bilateral hip  abduction and extension strength by at least 1/2 MMT grade to promote ability to ambulate with less difficulty and more balance.    Baseline  Hip extension: 3+/5 R and L, hip abduction: 4-/5 R and L (02/01/2019)    Time  8    Period  Weeks    Status  New    Target Date  03/30/19      PT LONG TERM GOAL #3   Title  Patient will improve his Berg Balance test score to 46/56 or more as a demonstration of improved balance and decreased need for AD.    Baseline  38/56 (02/01/2019)    Time  8    Period  Weeks    Status  New    Target Date  03/30/19      PT LONG TERM GOAL #4   Title  Pt will be able to ambulate at least 100 ft independently and not LOB to promote return to prior level of function    Baseline  Currently ambulating with SPC (02/01/2019)    Time  8    Period  Weeks    Status  New    Target Date  03/30/19             Plan - 02/01/19 1804    Clinical Impression Statement   Patient is an 85 year old male who came to physical therapy secondary to unsteady gait. He also presents with bilateral lateral knee pain along the lateral hamstrings tendon, poor posture, altered gait pattern, bilateral hip weakness, decreased balance based on Berg Balance test score, decreased activity tolerance, and difficulty performing functional tasks such as gait. Pt will benefit from skilled physical therapy services to address the aforementioned deficits.    Personal Factors and Comorbidities  Age;Comorbidity 3+;Fitness    Comorbidities  Pacemaker, ventricular tachycardia, sedentary lifestyle (per pt reports), CAD, HTN, lymphoma, arthritis, atrial flutter.    Examination-Activity Limitations  Stairs;Patent attorney for Others;Carry    Stability/Clinical Decision Making  Evolving/Moderate complexity    Clinical Decision Making  Moderate   balance seems to be worsening based on subjective reports   Rehab Potential  Fair    PT Frequency  2x / week    PT Duration  8 weeks    PT Treatment/Interventions  Aquatic Therapy;Gait training;Functional mobility training;Therapeutic activities;Therapeutic exercise;Balance training;Neuromuscular re-education;Patient/family education;Manual techniques;Dry needling    PT Next Visit Plan  hip strengthening, balance, manual techniques PRN    Consulted and Agree with Plan of Care  Patient       Patient will benefit from skilled therapeutic intervention in order to improve the following deficits and impairments:  Pain, Postural dysfunction, Improper body mechanics, Difficulty walking, Decreased strength, Decreased activity tolerance  Visit Diagnosis: Unsteadiness on feet - Plan: PT plan of care cert/re-cert  Right knee pain, unspecified chronicity - Plan: PT plan of care cert/re-cert  Left knee pain, unspecified chronicity - Plan: PT plan of care cert/re-cert  Muscle weakness (generalized) - Plan: PT plan of care cert/re-cert  Difficulty in  walking, not elsewhere classified - Plan: PT plan of care cert/re-cert     Problem List Patient Active Problem List   Diagnosis Date Noted  . Acute diastolic heart failure ([place]) 07/18/2018  . Persistent atrial fibrillation ([place]) 07/18/2018  . Atrial flutter ([place]) 07/18/2018  . Mild episode of recurrent major depressive disorder ([place]) 04/04/2018  . Complete heart block ([place]) 06/28/2017  . Hyperlipidemia 08/18/2016  . CKD (chronic kidney disease), stage III 08/18/2016  .  Allergic rhinitis due to allergen 04/30/2016  . Pre-diabetes 10/14/2015  . Hypertensive heart disease   . Mobitz II   . Cardiac pacemaker in situ 10/16/2014  . Tachycardia 10/16/2014  . Second degree Mobitz II AV block 12/05/2013  . Syncope 12/04/2013  . History of ventricular tachycardia 08/30/2013  . Abnormal EKG 08/30/2013  . Dizziness 08/30/2013  . Essential hypertension 08/30/2013  . Anxiety 08/30/2013  . Lymphoma in remission ([place]) 08/30/2013  . Coronary artery disease of native artery of native heart with stable angina pectoris ([place]) 02/24/2011  . Bundle branch block, right and left anterior fascicular 02/24/2011     Joneen Boers PT, DPT   02/01/2019, 6:42 PM  [facility] [address] [place], [address] Phone: [phone]   Fax:  [phone]  Name: Clinton Joseph MRN: [id number] Date of Birth: Nov 15, 1927

## 2019-02-07 ENCOUNTER — Ambulatory Visit: Payer: Medicare Other

## 2019-02-07 ENCOUNTER — Other Ambulatory Visit: Payer: Self-pay

## 2019-02-07 DIAGNOSIS — M25562 Pain in left knee: Secondary | ICD-10-CM

## 2019-02-07 DIAGNOSIS — M6281 Muscle weakness (generalized): Secondary | ICD-10-CM | POA: Diagnosis not present

## 2019-02-07 DIAGNOSIS — R2681 Unsteadiness on feet: Secondary | ICD-10-CM

## 2019-02-07 DIAGNOSIS — M25561 Pain in right knee: Secondary | ICD-10-CM | POA: Diagnosis not present

## 2019-02-07 DIAGNOSIS — R262 Difficulty in walking, not elsewhere classified: Secondary | ICD-10-CM

## 2019-02-07 NOTE — Patient Instructions (Signed)
Seated hip extension isometrics   Sitting on a chair,    Squeeze your rear end muscles together and press your left foot onto the floor.     Hold for 5 seconds    Repeat 10 times     Then repeat for your right foot    Hold for 5 seconds   Repeat 10 times      Perform 3 sets daily for each side.

## 2019-02-07 NOTE — Therapy (Signed)
Clinton [facility] [address] [place], [address] Phone: [phone]   Fax:  [phone]  Physical Therapy Treatment  Patient Details  Name: Clinton Joseph MRN: [id number] Date of Birth: 05-26-1927 Referring Provider (PT): Nobie Putnam, DO   Encounter Date: 02/07/2019  PT End of Session - 02/07/19 1350    Visit Number  2    Number of Visits  17    Date for PT Re-Evaluation  03/30/19    Authorization Type  2    Authorization Time Period  of 10 Medicare    PT Start Time  [id number]    PT Stop Time  1436    PT Time Calculation (min)  48 min    Equipment Utilized During Treatment  --    Activity Tolerance  Patient tolerated treatment well    Behavior During Therapy  [facility] for tasks assessed/performed       Past Medical History:  Diagnosis Date  . Anxiety state, unspecified   . Arthritis    a. Hands.  . Atrial flutter ([place])    a. Dx 03/2018-->Eliquis 5 BID started (CHA2DS2VASc = 5).  . Diffuse large B cell lymphoma ([place])    a. 2007 s/p chemo - followed by Dr. Jeb Levering.  . H/O echocardiogram    a. 11/2013 Echo: EF 55-60%, mildly dil LA, mild to mod Ao sclerosis.  . Hyperlipidemia   . Hypertensive heart disease   . Mobitz II    a. 11/2013 syncope-->s/p SJM Assurity DR model [id number] (ser # [id number]).  . Non-obstructive CAD    a. 10/2010 Cath: >M nl, LAD/LCX min irregs, RCA 40p, EF 60%.  . Tremor    a. right hand  . Urticaria, unspecified   . Ventricular tachycardia (HCC)    a. 2012->broke with amio, neg w/u.    Past Surgical History:  Procedure Laterality Date  . APPENDECTOMY  1948  . CARDIAC CATHETERIZATION  11/17/2010   [facility]- LAD min irregs, LCX min irregs, RCA 40p, EF 60%.   . CARDIOVERSION N/A 05/09/2018   Procedure: CARDIOVERSION (CATH LAB);  Surgeon: Minna Merritts, MD;  Location: [facility] ORS;  Service: Cardiovascular;  Laterality: N/A;  . CATARACT EXTRACTION, BILATERAL Bilateral ~ 2012  .  INSERT / REPLACE / REMOVE PACEMAKER  12/05/2013  . PERMANENT PACEMAKER INSERTION N/A 12/05/2013   Procedure: PERMANENT PACEMAKER INSERTION;  Surgeon: Coralyn Mark, MD;  Location: [facility] CATH LAB;  Service: Cardiovascular;  Laterality: N/A;    There were no vitals filed for this visit.  Subjective Assessment - 02/07/19 1350    Subjective  Might have had B lateral knee pain when walking from waiting room to treatment room but does not know how much.  Knees bothers him at night.    Pertinent History  B leg weakness, unsteady gait. Pt has been having difficulty walking for over 2 months ago. Pt currently ambulating with SPC since then. Gradual onset. Pt states having problems with his heart a while ago. Ever since he started having problems with his heart, pt starting having difficulty walking but started using his cane 2 months ago.  Both legs feel equally weak. Pt feels pulling at times at his R and L lateral knee (around lateral hamstrings). Using a heating pad helps. Currently has a pacemaker as well.    Patient Stated Goals  Walk without the cane.    Currently in Pain?  Yes    Pain Score  --   No  pain level provided.                              PT Education - 02/07/19 1357    Education Details  ther-ex    Person(s) Educated  Patient    Methods  Explanation;Demonstration;Tactile cues;Verbal cues    Comprehension  Returned demonstration;Verbalized understanding      Objectives  Therapeutic exercise Seated hamstrings isometrics targeting lateral hamstrings 40% effort x 1 minute with 1 minute rest breaks  R 5x  L 5x Seated knee flexion targeting medial hamstrings   R 5x3  L 5x3  Seated clamshell isometrics, manually resisted by PT, hips less than 90 degrees flexion   10x5 seconds for 2 sets   Seated hip extension isometrics   R 10x 5 seconds   L 10x 5 seconds    Weakness observed   Improved exercise technique, movement at target joints, use of target  muscles after mod verbal, visual, tactile cues.      Manual therapy  Seated STM B lateral hamstrings, IT band, vastus lateralis Seated myofascial release B lateral knees.    Response to treatment No B knee pain with gait after session. Improved balance reported by pt with decreased B knee pain    Clinical impression Worked on isometric hamstrings activation to promote proper tension to bilateral lateral hamstrings tendons to promote healing, medial hamstrings, glute med, and glute max strengthening, and decreasing bilateral lateral hamstrings, vastus lateralis, and IT band tension to promote better mechanics at his knee joints. No bilateral knee pain with gait reported by pt after session with improved balance reported by pt with decreased knee pain. Pt will benefit from continued skilled physical therapy services to decrease pain, improve strength, balance, and function.      PT Short Term Goals - 02/01/19 1812      PT SHORT TERM GOAL #1   Title  Pt will be independent with his HEP to decrease B knee pain, improve balance and decrease difficulty with gait.    Time  3    Period  Weeks    Status  New    Target Date  02/23/19        PT Long Term Goals - 02/01/19 1813      PT LONG TERM GOAL #1   Title  Patient will have a decrease in B knee pain to 3/10 or less at worst to promote ability to amblate more steadily and decrease need for AD.    Baseline  6/10 bilateral lateral knee pain at most for the past 6 months (02/01/2019)    Time  8    Period  Weeks    Status  New    Target Date  03/30/19      PT LONG TERM GOAL #2   Title  Patient will improve bilateral hip abduction and extension strength by at least 1/2 MMT grade to promote ability to ambulate with less difficulty and more balance.    Baseline  Hip extension: 3+/5 R and L, hip abduction: 4-/5 R and L (02/01/2019)    Time  8    Period  Weeks    Status  New    Target Date  03/30/19      PT LONG TERM GOAL #3   Title   Patient will improve his Berg Balance test score to 46/56 or more as a demonstration of improved balance and decreased need for AD.  Baseline  38/56 (02/01/2019)    Time  8    Period  Weeks    Status  New    Target Date  03/30/19      PT LONG TERM GOAL #4   Title  Pt will be able to ambulate at least 100 ft independently and not LOB to promote return to prior level of function    Baseline  Currently ambulating with SPC (02/01/2019)    Time  8    Period  Weeks    Status  New    Target Date  03/30/19            Plan - 02/07/19 1358    Clinical Impression Statement  Worked on isometric hamstrings activation to promote proper tension to bilateral lateral hamstrings tendons to promote healing, medial hamstrings, glute med, and glute max strengthening, and decreasing bilateral lateral hamstrings, vastus lateralis, and IT band tension to promote better mechanics at his knee joints. No bilateral knee pain with gait reported by pt after session with improved balance reported by pt with decreased knee pain. Pt will benefit from continued skilled physical therapy services to decrease pain, improve strength, balance, and function.    Personal Factors and Comorbidities  Age;Comorbidity 3+;Fitness    Comorbidities  Pacemaker, ventricular tachycardia, sedentary lifestyle (per pt reports), CAD, HTN, lymphoma, arthritis, atrial flutter.    Examination-Activity Limitations  Stairs;Patent attorney for Others;Carry    Stability/Clinical Decision Making  Evolving/Moderate complexity    Rehab Potential  Fair    PT Frequency  2x / week    PT Duration  8 weeks    PT Treatment/Interventions  Aquatic Therapy;Gait training;Functional mobility training;Therapeutic activities;Therapeutic exercise;Balance training;Neuromuscular re-education;Patient/family education;Manual techniques;Dry needling    PT Next Visit Plan  hip strengthening, balance, manual techniques PRN    Consulted and Agree with Plan of  Care  Patient       Patient will benefit from skilled therapeutic intervention in order to improve the following deficits and impairments:  Pain, Postural dysfunction, Improper body mechanics, Difficulty walking, Decreased strength, Decreased activity tolerance  Visit Diagnosis: Right knee pain, unspecified chronicity  Left knee pain, unspecified chronicity  Muscle weakness (generalized)  Difficulty in walking, not elsewhere classified  Unsteadiness on feet     Problem List Patient Active Problem List   Diagnosis Date Noted  . Acute diastolic heart failure ([place]) 07/18/2018  . Persistent atrial fibrillation ([place]) 07/18/2018  . Atrial flutter ([place]) 07/18/2018  . Mild episode of recurrent major depressive disorder ([place]) 04/04/2018  . Complete heart block ([place]) 06/28/2017  . Hyperlipidemia 08/18/2016  . CKD (chronic kidney disease), stage III 08/18/2016  . Allergic rhinitis due to allergen 04/30/2016  . Pre-diabetes 10/14/2015  . Hypertensive heart disease   . Mobitz II   . Cardiac pacemaker in situ 10/16/2014  . Tachycardia 10/16/2014  . Second degree Mobitz II AV block 12/05/2013  . Syncope 12/04/2013  . History of ventricular tachycardia 08/30/2013  . Abnormal EKG 08/30/2013  . Dizziness 08/30/2013  . Essential hypertension 08/30/2013  . Anxiety 08/30/2013  . Lymphoma in remission ([place]) 08/30/2013  . Coronary artery disease of native artery of native heart with stable angina pectoris ([place]) 02/24/2011  . Bundle branch block, right and left anterior fascicular 02/24/2011    Joneen Boers PT, DPT   02/07/2019, 3:01 PM  [facility] [address] [place], [address] Phone: [phone]   Fax:  [phone]  Name: Broadus John  Argusta Volek MRN: [id number] Date of Birth: 1927/06/17

## 2019-02-09 ENCOUNTER — Other Ambulatory Visit: Payer: Self-pay

## 2019-02-09 ENCOUNTER — Ambulatory Visit: Payer: Medicare Other

## 2019-02-09 DIAGNOSIS — M6281 Muscle weakness (generalized): Secondary | ICD-10-CM

## 2019-02-09 DIAGNOSIS — M25562 Pain in left knee: Secondary | ICD-10-CM

## 2019-02-09 DIAGNOSIS — R2681 Unsteadiness on feet: Secondary | ICD-10-CM | POA: Diagnosis not present

## 2019-02-09 DIAGNOSIS — M25561 Pain in right knee: Secondary | ICD-10-CM

## 2019-02-09 DIAGNOSIS — R262 Difficulty in walking, not elsewhere classified: Secondary | ICD-10-CM

## 2019-02-09 NOTE — Patient Instructions (Signed)
Seated hamstrings flexion isometrics at 40% effort    Sitting on your chair   Dig your right heel against the floor activating the muscles behind your thigh at 40 % effort for 1 minute   Rest, then switch to your left heel digging onto the floor to activate the muscles behind your thigh at 40% effort for 1 minute.     Perform 5 times per set.     Do 3 sets per day. Each set to be performed a few hours apart.

## 2019-02-09 NOTE — Therapy (Signed)
[facility] [address], [place], [address] Phone: [phone]   Fax:  [phone]  Physical Therapy Treatment  Patient Details  Name: Clinton Joseph MRN: [id number] Date of Birth: 1928-01-10 Referring Provider (PT): Nobie Putnam, DO   Encounter Date: 02/09/2019  PT End of Session - 02/09/19 1350    Visit Number  3    Number of Visits  17    Date for PT Re-Evaluation  03/30/19    Authorization Type  3    Authorization Time Period  of 10 Medicare    PT Start Time  1350    PT Stop Time  1433    PT Time Calculation (min)  43 min    Activity Tolerance  Patient tolerated treatment well    Behavior During Therapy  [facility] for tasks assessed/performed       Past Medical History:  Diagnosis Date  . Anxiety state, unspecified   . Arthritis    a. Hands.  . Atrial flutter ([place])    a. Dx 03/2018-->Eliquis 5 BID started (CHA2DS2VASc = 5).  . Diffuse large B cell lymphoma ([place])    a. 2007 s/p chemo - followed by Dr. Jeb Levering.  . H/O echocardiogram    a. 11/2013 Echo: EF 55-60%, mildly dil LA, mild to mod Ao sclerosis.  . Hyperlipidemia   . Hypertensive heart disease   . Mobitz II    a. 11/2013 syncope-->s/p SJM Assurity DR model V7204091 (ser # [id number]).  . Non-obstructive CAD    a. 10/2010 Cath: >M nl, LAD/LCX min irregs, RCA 40p, EF 60%.  . Tremor    a. right hand  . Urticaria, unspecified   . Ventricular tachycardia (HCC)    a. 2012->broke with amio, neg w/u.    Past Surgical History:  Procedure Laterality Date  . APPENDECTOMY  1948  . CARDIAC CATHETERIZATION  11/17/2010   [facility]- LAD min irregs, LCX min irregs, RCA 40p, EF 60%.   . CARDIOVERSION N/A 05/09/2018   Procedure: CARDIOVERSION (CATH LAB);  Surgeon: Minna Merritts, MD;  Location: [facility] ORS;  Service: Cardiovascular;  Laterality: N/A;  . CATARACT EXTRACTION, BILATERAL Bilateral ~ 2012  . INSERT / REPLACE / REMOVE PACEMAKER  12/05/2013   . PERMANENT PACEMAKER INSERTION N/A 12/05/2013   Procedure: PERMANENT PACEMAKER INSERTION;  Surgeon: Coralyn Mark, MD;  Location: [facility] CATH LAB;  Service: Cardiovascular;  Laterality: N/A;    There were no vitals filed for this visit.  Subjective Assessment - 02/09/19 1352    Subjective  Knees are ok. 7/10 B lateral knee pain when walking from waiting room to treatment room (about 50 ft). Knees feel better since PT started.    Pertinent History  B leg weakness, unsteady gait. Pt has been having difficulty walking for over 2 months ago. Pt currently ambulating with SPC since then. Gradual onset. Pt states having problems with his heart a while ago. Ever since he started having problems with his heart, pt starting having difficulty walking but started using his cane 2 months ago.  Both legs feel equally weak. Pt feels pulling at times at his R and L lateral knee (around lateral hamstrings). Using a heating pad helps. Currently has a pacemaker as well.    Patient Stated Goals  Walk without the cane.    Currently in Pain?  Yes    Pain Score  7  PT Education - 02/09/19 1405    Education Details  ther-ex    Person(s) Educated  Patient    Methods  Explanation;Demonstration;Tactile cues;Verbal cues    Comprehension  Returned demonstration;Verbalized understanding        Objectives  PACEMAKER  Therapeutic exercise Seated hamstrings isometrics targeting lateral hamstrings 40% effort x 1 minute with 1 minute rest breaks             R 5x                L 5x  Seated knee flexion targeting medial hamstrings              R 5x3             L 5x3  Seated clamshell isometrics, manually resisted by PT, hips less than 90 degrees flexion              10x5 seconds for 2 sets  Seated manually resisted hip extension   R 5x  Exercise stopped secondary to pt feeling nauseous  Blood pressure L arm sitting, mechanically taken, normal cuff:    143/54, HR 59      Improved exercise technique, movement at target joints, use of target muscles after mod verbal, visual, tactile cues.      Manual therapy  Seated STM B lateral hamstrings, IT band, vastus lateralis Seated myofascial release B lateral knees.    Response to treatment Decreased B knee pain to 4/10 after session. Feels better reported by pt.   Clinical impression Pt improvig knee comfort level with gait based on subjective reports. Continued working on isometric lateral hamstring muscle contraction to promote proper stress to promote healing to the tendons, improving medial hamstring, and glute muscle strengthening as well as decreasing lateral soft tissue tension to his knees to promote better mechanics at his knees when performing closed chain tasks. Continued working on decreasing bilateral knee pain to promote ability to weight bear more comfortably on his LEs to improve balance. Decreased B knee pain reported by patient after session. Pt will benefit from continued skilled physical therapy services to decrease pain, improve strength, and ability to ambulate more safely and with less difficulty.     PT Short Term Goals - 02/01/19 1812      PT SHORT TERM GOAL #1   Title  Pt will be independent with his HEP to decrease B knee pain, improve balance and decrease difficulty with gait.    Time  3    Period  Weeks    Status  New    Target Date  02/23/19        PT Long Term Goals - 02/01/19 1813      PT LONG TERM GOAL #1   Title  Patient will have a decrease in B knee pain to 3/10 or less at worst to promote ability to amblate more steadily and decrease need for AD.    Baseline  6/10 bilateral lateral knee pain at most for the past 6 months (02/01/2019)    Time  8    Period  Weeks    Status  New    Target Date  03/30/19      PT LONG TERM GOAL #2   Title  Patient will improve bilateral hip abduction and extension strength by at least 1/2 MMT grade  to promote ability to ambulate with less difficulty and more balance.    Baseline  Hip extension: 3+/5 R and L, hip abduction: 4-/5  R and L (02/01/2019)    Time  8    Period  Weeks    Status  New    Target Date  03/30/19      PT LONG TERM GOAL #3   Title  Patient will improve his Berg Balance test score to 46/56 or more as a demonstration of improved balance and decreased need for AD.    Baseline  38/56 (02/01/2019)    Time  8    Period  Weeks    Status  New    Target Date  03/30/19      PT LONG TERM GOAL #4   Title  Pt will be able to ambulate at least 100 ft independently and not LOB to promote return to prior level of function    Baseline  Currently ambulating with SPC (02/01/2019)    Time  8    Period  Weeks    Status  New    Target Date  03/30/19            Plan - 02/09/19 1405    Clinical Impression Statement  Pt improvig knee comfort level with gait based on subjective reports. Continued working on isometric lateral hamstring muscle contraction to promote proper stress to promote healing to the tendons, improving medial hamstring, and glute muscle strengthening as well as decreasing lateral soft tissue tension to his knees to promote better mechanics at his knees when performing closed chain tasks. Continued working on decreasing bilateral knee pain to promote ability to weight bear more comfortably on his LEs to improve balance. Decreased B knee pain reported by patient after session. Pt will benefit from continued skilled physical therapy services to decrease pain, improve strength, and ability to ambulate more safely and with less difficulty.    Personal Factors and Comorbidities  Age;Comorbidity 3+;Fitness    Comorbidities  Pacemaker, ventricular tachycardia, sedentary lifestyle (per pt reports), CAD, HTN, lymphoma, arthritis, atrial flutter.    Examination-Activity Limitations  Stairs;Patent attorney for Others;Carry    Stability/Clinical Decision Making   Evolving/Moderate complexity    Rehab Potential  Fair    PT Frequency  2x / week    PT Duration  8 weeks    PT Treatment/Interventions  Aquatic Therapy;Gait training;Functional mobility training;Therapeutic activities;Therapeutic exercise;Balance training;Neuromuscular re-education;Patient/family education;Manual techniques;Dry needling    PT Next Visit Plan  hip strengthening, balance, manual techniques PRN    Consulted and Agree with Plan of Care  Patient       Patient will benefit from skilled therapeutic intervention in order to improve the following deficits and impairments:  Pain, Postural dysfunction, Improper body mechanics, Difficulty walking, Decreased strength, Decreased activity tolerance  Visit Diagnosis: Right knee pain, unspecified chronicity  Left knee pain, unspecified chronicity  Muscle weakness (generalized)  Difficulty in walking, not elsewhere classified  Unsteadiness on feet     Problem List Patient Active Problem List   Diagnosis Date Noted  . Acute diastolic heart failure ([place]) 07/18/2018  . Persistent atrial fibrillation ([place]) 07/18/2018  . Atrial flutter ([place]) 07/18/2018  . Mild episode of recurrent major depressive disorder ([place]) 04/04/2018  . Complete heart block ([place]) 06/28/2017  . Hyperlipidemia 08/18/2016  . CKD (chronic kidney disease), stage III 08/18/2016  . Allergic rhinitis due to allergen 04/30/2016  . Pre-diabetes 10/14/2015  . Hypertensive heart disease   . Mobitz II   . Cardiac pacemaker in situ 10/16/2014  . Tachycardia 10/16/2014  . Second degree Mobitz II AV block 12/05/2013  . Syncope 12/04/2013  .  History of ventricular tachycardia 08/30/2013  . Abnormal EKG 08/30/2013  . Dizziness 08/30/2013  . Essential hypertension 08/30/2013  . Anxiety 08/30/2013  . Lymphoma in remission ([place]) 08/30/2013  . Coronary artery disease of native artery of native heart with stable angina pectoris ([place]) 02/24/2011  . Bundle branch block,  right and left anterior fascicular 02/24/2011    Joneen Boers PT, DPT   02/09/2019, 5:59 PM  [facility] [address] [place], [address] Phone: [phone]   Fax:  [phone]  Name: Clinton Joseph MRN: [id number] Date of Birth: 02-08-1928

## 2019-02-14 ENCOUNTER — Other Ambulatory Visit: Payer: Self-pay

## 2019-02-14 ENCOUNTER — Ambulatory Visit: Payer: Medicare Other

## 2019-02-14 DIAGNOSIS — R262 Difficulty in walking, not elsewhere classified: Secondary | ICD-10-CM

## 2019-02-14 DIAGNOSIS — M25562 Pain in left knee: Secondary | ICD-10-CM | POA: Diagnosis not present

## 2019-02-14 DIAGNOSIS — M6281 Muscle weakness (generalized): Secondary | ICD-10-CM | POA: Diagnosis not present

## 2019-02-14 DIAGNOSIS — M25561 Pain in right knee: Secondary | ICD-10-CM

## 2019-02-14 DIAGNOSIS — R2681 Unsteadiness on feet: Secondary | ICD-10-CM | POA: Diagnosis not present

## 2019-02-14 NOTE — Therapy (Signed)
[facility] [address] [place], [address] Phone: [phone]   Fax:  [phone]  Physical Therapy Treatment  Patient Details  Name: Clinton Joseph MRN: [id number] Date of Birth: 07-05-1927 Referring Provider (PT): Nobie Putnam, DO   Encounter Date: 02/14/2019  PT End of Session - 02/14/19 1353    Visit Number  4    Number of Visits  17    Date for PT Re-Evaluation  03/30/19    Authorization Type  4    Authorization Time Period  of 10 Medicare    PT Start Time  1353   pt arrived late   PT Stop Time  1427    PT Time Calculation (min)  34 min    Activity Tolerance  Patient tolerated treatment well    Behavior During Therapy  [facility] for tasks assessed/performed       Past Medical History:  Diagnosis Date  . Anxiety state, unspecified   . Arthritis    a. Hands.  . Atrial flutter ([place])    a. Dx 03/2018-->Eliquis 5 BID started (CHA2DS2VASc = 5).  . Diffuse large B cell lymphoma ([place])    a. 2007 s/p chemo - followed by Dr. Jeb Levering.  . H/O echocardiogram    a. 11/2013 Echo: EF 55-60%, mildly dil LA, mild to mod Ao sclerosis.  . Hyperlipidemia   . Hypertensive heart disease   . Mobitz II    a. 11/2013 syncope-->s/p SJM Assurity DR model [id number] (ser # [id number]).  . Non-obstructive CAD    a. 10/2010 Cath: >M nl, LAD/LCX min irregs, RCA 40p, EF 60%.  . Tremor    a. right hand  . Urticaria, unspecified   . Ventricular tachycardia (HCC)    a. 2012->broke with amio, neg w/u.    Past Surgical History:  Procedure Laterality Date  . APPENDECTOMY  1948  . CARDIAC CATHETERIZATION  11/17/2010   [facility]- LAD min irregs, LCX min irregs, RCA 40p, EF 60%.   . CARDIOVERSION N/A 05/09/2018   Procedure: CARDIOVERSION (CATH LAB);  Surgeon: Minna Merritts, MD;  Location: [facility] ORS;  Service: Cardiovascular;  Laterality: N/A;  . CATARACT EXTRACTION, BILATERAL Bilateral ~ 2012  . INSERT / REPLACE / REMOVE  PACEMAKER  12/05/2013  . PERMANENT PACEMAKER INSERTION N/A 12/05/2013   Procedure: PERMANENT PACEMAKER INSERTION;  Surgeon: Coralyn Mark, MD;  Location: [place] CATH LAB;  Service: Cardiovascular;  Laterality: N/A;    There were no vitals filed for this visit.  Subjective Assessment - 02/14/19 1354    Subjective  Doing fine with his knees. Knees are doing better. No B knee pain with gait.    Pertinent History  B leg weakness, unsteady gait. Pt has been having difficulty walking for over 2 months ago. Pt currently ambulating with SPC since then. Gradual onset. Pt states having problems with his heart a while ago. Ever since he started having problems with his heart, pt starting having difficulty walking but started using his cane 2 months ago.  Both legs feel equally weak. Pt feels pulling at times at his R and L lateral knee (around lateral hamstrings). Using a heating pad helps. Currently has a pacemaker as well.    Patient Stated Goals  Walk without the cane.    Currently in Pain?  No/denies    Pain Score  0-No pain  PT Education - 02/14/19 1403    Education Details  ther-ex    Person(s) Educated  Patient    Methods  Explanation;Demonstration;Tactile cues;Verbal cues    Comprehension  Returned demonstration;Verbalized understanding        Objectives  PACEMAKER    Pt observed to ambulate into clinic without using his SPC. No pain in B knees 02/14/2019    Therapeutic exercise  Forward step up onto Air Ex pad with B UE assist   R 10x2  L 10x2  Lateral step up onto Air Ex pad with B UE assist   R 10x2  L 10x2  Standing hip abduction with B UE assist   R 10x2  L 10x2  Standing hip extension with B UE assist   R 10x2  L 10x2  Improved exercise technique, movement at target joints, use of target muscles after mod verbal, visual, tactile cues.   Manual therapy   Seated STM B lateral hamstrings, IT band, vastus  lateralis Seated myofascial release B lateral knees.    Response to treatment Pt tolerated session well without aggravation of symptoms   Clinical impression Pt wanted to end session early secondary to being hot (air conditioning not working). No B knee pain with gait after session. Pt improving with B knee level of comfort and improved balance based on subjective reports and clinical observations. Continued working on glute med and max strengthening as well as decreasing lateral knee soft tissue tightness to promote better mechanics at his knee. Pt will benefit from continued skilled physical therapy services to decrease pain, improve balance, strength, and function.      PT Short Term Goals - 02/01/19 1812      PT SHORT TERM GOAL #1   Title  Pt will be independent with his HEP to decrease B knee pain, improve balance and decrease difficulty with gait.    Time  3    Period  Weeks    Status  New    Target Date  02/23/19        PT Long Term Goals - 02/01/19 1813      PT LONG TERM GOAL #1   Title  Patient will have a decrease in B knee pain to 3/10 or less at worst to promote ability to amblate more steadily and decrease need for AD.    Baseline  6/10 bilateral lateral knee pain at most for the past 6 months (02/01/2019)    Time  8    Period  Weeks    Status  New    Target Date  03/30/19      PT LONG TERM GOAL #2   Title  Patient will improve bilateral hip abduction and extension strength by at least 1/2 MMT grade to promote ability to ambulate with less difficulty and more balance.    Baseline  Hip extension: 3+/5 R and L, hip abduction: 4-/5 R and L (02/01/2019)    Time  8    Period  Weeks    Status  New    Target Date  03/30/19      PT LONG TERM GOAL #3   Title  Patient will improve his Berg Balance test score to 46/56 or more as a demonstration of improved balance and decreased need for AD.    Baseline  38/56 (02/01/2019)    Time  8    Period  Weeks    Status   New    Target Date  03/30/19  PT LONG TERM GOAL #4   Title  Pt will be able to ambulate at least 100 ft independently and not LOB to promote return to prior level of function    Baseline  Currently ambulating with SPC (02/01/2019)    Time  8    Period  Weeks    Status  New    Target Date  03/30/19            Plan - 02/14/19 1640    Clinical Impression Statement  Pt wanted to end session early secondary to being hot (air conditioning not working). No B knee pain with gait after session. Pt improving with B knee level of comfort and improved balance based on subjective reports and clinical observations. Continued working on glute med and max strengthening as well as decreasing lateral knee soft tissue tightness to promote better mechanics at his knee. Pt will benefit from continued skilled physical therapy services to decrease pain, improve balance, strength, and function.    Personal Factors and Comorbidities  Age;Comorbidity 3+;Fitness    Comorbidities  Pacemaker, ventricular tachycardia, sedentary lifestyle (per pt reports), CAD, HTN, lymphoma, arthritis, atrial flutter.    Examination-Activity Limitations  Stairs;Patent attorney for Others;Carry    Stability/Clinical Decision Making  Evolving/Moderate complexity    Rehab Potential  Fair    PT Frequency  2x / week    PT Duration  8 weeks    PT Treatment/Interventions  Aquatic Therapy;Gait training;Functional mobility training;Therapeutic activities;Therapeutic exercise;Balance training;Neuromuscular re-education;Patient/family education;Manual techniques;Dry needling    PT Next Visit Plan  hip strengthening, balance, manual techniques PRN    Consulted and Agree with Plan of Care  Patient       Patient will benefit from skilled therapeutic intervention in order to improve the following deficits and impairments:  Pain, Postural dysfunction, Improper body mechanics, Difficulty walking, Decreased strength, Decreased activity  tolerance  Visit Diagnosis: Right knee pain, unspecified chronicity  Left knee pain, unspecified chronicity  Muscle weakness (generalized)  Difficulty in walking, not elsewhere classified  Unsteadiness on feet     Problem List Patient Active Problem List   Diagnosis Date Noted  . Acute diastolic heart failure ([place]) 07/18/2018  . Persistent atrial fibrillation ([place]) 07/18/2018  . Atrial flutter ([place]) 07/18/2018  . Mild episode of recurrent major depressive disorder ([place]) 04/04/2018  . Complete heart block ([place]) 06/28/2017  . Hyperlipidemia 08/18/2016  . CKD (chronic kidney disease), stage III 08/18/2016  . Allergic rhinitis due to allergen 04/30/2016  . Pre-diabetes 10/14/2015  . Hypertensive heart disease   . Mobitz II   . Cardiac pacemaker in situ 10/16/2014  . Tachycardia 10/16/2014  . Second degree Mobitz II AV block 12/05/2013  . Syncope 12/04/2013  . History of ventricular tachycardia 08/30/2013  . Abnormal EKG 08/30/2013  . Dizziness 08/30/2013  . Essential hypertension 08/30/2013  . Anxiety 08/30/2013  . Lymphoma in remission ([place]) 08/30/2013  . Coronary artery disease of native artery of native heart with stable angina pectoris ([place]) 02/24/2011  . Bundle branch block, right and left anterior fascicular 02/24/2011    Joneen Boers PT, DPT   02/14/2019, 4:49 PM  [facility] [address], [place], [address] Phone: [phone]   Fax:  [phone]  Name: Clinton Joseph MRN: [id number] Date of Birth: February 11, 1928

## 2019-02-16 ENCOUNTER — Ambulatory Visit: Payer: Medicare Other

## 2019-02-17 ENCOUNTER — Other Ambulatory Visit: Payer: Self-pay

## 2019-02-17 MED ORDER — CARVEDILOL 3.125 MG PO TABS: 3.1250 mg | ORAL_TABLET | Freq: Two times a day (BID) | ORAL | 0 refills | Status: DC

## 2019-02-17 NOTE — Telephone Encounter (Signed)
Requested Prescriptions   Signed Prescriptions Disp Refills  . carvedilol (COREG) 3.125 MG tablet 180 tablet 0    Sig: Take 1 tablet (3.125 mg total) by mouth 2 (two) times daily.    Authorizing Provider: GOLLAN, TIMOTHY J    Ordering User: NEWCOMER MCCLAIN, Courteney Alderete L    

## 2019-02-21 ENCOUNTER — Other Ambulatory Visit: Payer: Self-pay

## 2019-02-21 ENCOUNTER — Ambulatory Visit: Payer: Medicare Other | Attending: Family Medicine

## 2019-02-21 DIAGNOSIS — M25562 Pain in left knee: Secondary | ICD-10-CM | POA: Insufficient documentation

## 2019-02-21 DIAGNOSIS — R262 Difficulty in walking, not elsewhere classified: Secondary | ICD-10-CM | POA: Diagnosis not present

## 2019-02-21 DIAGNOSIS — M6281 Muscle weakness (generalized): Secondary | ICD-10-CM | POA: Insufficient documentation

## 2019-02-21 DIAGNOSIS — R2681 Unsteadiness on feet: Secondary | ICD-10-CM | POA: Insufficient documentation

## 2019-02-21 DIAGNOSIS — M25561 Pain in right knee: Secondary | ICD-10-CM | POA: Diagnosis not present

## 2019-02-21 NOTE — Patient Instructions (Signed)
Access Code: [email or web address]  URL: [email or web address]  Date: 02/21/2019  Prepared by: Joneen Boers   Exercises Standing Hip Abduction with Counter Support - 10 reps - 3 sets - 1x daily - 7x weekly Standing Hip Extension with Counter Support - 10 reps - 3 sets - 1x daily - 7x weekly

## 2019-02-21 NOTE — Therapy (Signed)
[facility] [address], [place], [address] Phone: [phone]   Fax:  [phone]  Physical Therapy Treatment  Patient Details  Name: Clinton Joseph MRN: [id number] Date of Birth: Jul 05, 1927 Referring Provider (PT): Nobie Putnam, DO   Encounter Date: 02/21/2019  PT End of Session - 02/21/19 1350    Visit Number  5    Number of Visits  17    Date for PT Re-Evaluation  03/30/19    Authorization Type  5    Authorization Time Period  of 10 Medicare    PT Start Time  1350    PT Stop Time  1431    PT Time Calculation (min)  41 min    Activity Tolerance  Patient tolerated treatment well    Behavior During Therapy  [facility] for tasks assessed/performed       Past Medical History:  Diagnosis Date  . Anxiety state, unspecified   . Arthritis    a. Hands.  . Atrial flutter ([place])    a. Dx 03/2018-->Eliquis 5 BID started (CHA2DS2VASc = 5).  . Diffuse large B cell lymphoma ([place])    a. 2007 s/p chemo - followed by Dr. Jeb Levering.  . H/O echocardiogram    a. 11/2013 Echo: EF 55-60%, mildly dil LA, mild to mod Ao sclerosis.  . Hyperlipidemia   . Hypertensive heart disease   . Mobitz II    a. 11/2013 syncope-->s/p SJM Assurity DR model V7204091 (ser # [id number]).  . Non-obstructive CAD    a. 10/2010 Cath: >M nl, LAD/LCX min irregs, RCA 40p, EF 60%.  . Tremor    a. right hand  . Urticaria, unspecified   . Ventricular tachycardia (HCC)    a. 2012->broke with amio, neg w/u.    Past Surgical History:  Procedure Laterality Date  . APPENDECTOMY  1948  . CARDIAC CATHETERIZATION  11/17/2010   [facility]- LAD min irregs, LCX min irregs, RCA 40p, EF 60%.   . CARDIOVERSION N/A 05/09/2018   Procedure: CARDIOVERSION (CATH LAB);  Surgeon: Minna Merritts, MD;  Location: [facility] ORS;  Service: Cardiovascular;  Laterality: N/A;  . CATARACT EXTRACTION, BILATERAL Bilateral ~ 2012  . INSERT / REPLACE / REMOVE PACEMAKER  12/05/2013   . PERMANENT PACEMAKER INSERTION N/A 12/05/2013   Procedure: PERMANENT PACEMAKER INSERTION;  Surgeon: Coralyn Mark, MD;  Location: [facility] CATH LAB;  Service: Cardiovascular;  Laterality: N/A;    There were no vitals filed for this visit.  Subjective Assessment - 02/21/19 1352    Subjective  knees are doing fine. Still has the bilateral knee pain. Still sore but not all the time. Does not have to use the heating pad at night anymore and thats progress. No pain currently and when walking from the car to the treatment chair.    Pertinent History  B leg weakness, unsteady gait. Pt has been having difficulty walking for over 2 months ago. Pt currently ambulating with SPC since then. Gradual onset. Pt states having problems with his heart a while ago. Ever since he started having problems with his heart, pt starting having difficulty walking but started using his cane 2 months ago.  Both legs feel equally weak. Pt feels pulling at times at his R and L lateral knee (around lateral hamstrings). Using a heating pad helps. Currently has a pacemaker as well.    Patient Stated Goals  Walk without the cane.    Currently in Pain?  No/denies  Pain Score  0-No pain                               PT Education - 02/21/19 1411    Education Details  ther-ex, HEP    Person(s) Educated  Patient    Methods  Explanation;Demonstration;Tactile cues;Verbal cues;Handout    Comprehension  Returned demonstration;Verbalized understanding        Objectives  PACEMAKER    Pt observed to ambulate into clinic without using his SPC. No pain in B knees 02/14/2019  [place] Access Code: [id number]  Therapeutic exercise   Standing hip abduction with B UE assist              R 10x3             L 10x3  Standing hip extension with B UE assist              R 10x2             L 10x2 Seated hamstrings flexion isometrics at 40% effort 1 min with 1 min rest  R 5x  L  5x  alternating  Standing knee flexion with B UE assist  R 10x2  L 10x2  Reviewed HEP. Pt demonstrated and verbalized understanding.     Improved exercise technique, movement at target joints, use of target muscles after mod verbal, visual, tactile cues.     Manual therapy Seated STM B lateral hamstrings, IT band, vastus lateralis   Response to treatment Pt tolerated session well without aggravation of symptoms   Clinical impression Pt making progress with decreasing bilateral lateral knee pain based on subjective reports. Continued working on promoting proper stress the lateral hamstrings tendons, as well as improving glute med and max strength and decreasing lateral soft tissue tension to promote better mechanics at his knees and decrease pain with gait and therefore increase balance and decrease fall risk. Pt continues to be able to ambulate without use of his SPC (pt observed just carrying the [facility]). Pt making progress with PT towards goals. Pt will benefit from continued skilled physical therapy services to decrease pain, improve strength, balance and function.      PT Short Term Goals - 02/01/19 1812      PT SHORT TERM GOAL #1   Title  Pt will be independent with his HEP to decrease B knee pain, improve balance and decrease difficulty with gait.    Time  3    Period  Weeks    Status  New    Target Date  02/23/19        PT Long Term Goals - 02/01/19 1813      PT LONG TERM GOAL #1   Title  Patient will have a decrease in B knee pain to 3/10 or less at worst to promote ability to amblate more steadily and decrease need for AD.    Baseline  6/10 bilateral lateral knee pain at most for the past 6 months (02/01/2019)    Time  8    Period  Weeks    Status  New    Target Date  03/30/19      PT LONG TERM GOAL #2   Title  Patient will improve bilateral hip abduction and extension strength by at least 1/2 MMT grade to promote ability to ambulate with less difficulty  and more balance.    Baseline  Hip extension: 3+/5 R and L,  hip abduction: 4-/5 R and L (02/01/2019)    Time  8    Period  Weeks    Status  New    Target Date  03/30/19      PT LONG TERM GOAL #3   Title  Patient will improve his Berg Balance test score to 46/56 or more as a demonstration of improved balance and decreased need for AD.    Baseline  38/56 (02/01/2019)    Time  8    Period  Weeks    Status  New    Target Date  03/30/19      PT LONG TERM GOAL #4   Title  Pt will be able to ambulate at least 100 ft independently and not LOB to promote return to prior level of function    Baseline  Currently ambulating with SPC (02/01/2019)    Time  8    Period  Weeks    Status  New    Target Date  03/30/19            Plan - 02/21/19 1439    Clinical Impression Statement  Pt making progress with decreasing bilateral lateral knee pain based on subjective reports. Continued working on promoting proper stress the lateral hamstrings tendons, as well as improving glute med and max strength and decreasing lateral soft tissue tension to promote better mechanics at his knees and decrease pain with gait and therefore increase balance and decrease fall risk. Pt continues to be able to ambulate without use of his SPC (pt observed just carrying the [facility]). Pt making progress with PT towards goals. Pt will benefit from continued skilled physical therapy services to decrease pain, improve strength, balance and function.    Personal Factors and Comorbidities  Age;Comorbidity 3+;Fitness    Comorbidities  Pacemaker, ventricular tachycardia, sedentary lifestyle (per pt reports), CAD, HTN, lymphoma, arthritis, atrial flutter.    Examination-Activity Limitations  Stairs;Patent attorney for Others;Carry    Stability/Clinical Decision Making  Evolving/Moderate complexity    Rehab Potential  Fair    PT Frequency  2x / week    PT Duration  8 weeks    PT Treatment/Interventions  Aquatic Therapy;Gait  training;Functional mobility training;Therapeutic activities;Therapeutic exercise;Balance training;Neuromuscular re-education;Patient/family education;Manual techniques;Dry needling    PT Next Visit Plan  hip strengthening, balance, manual techniques PRN    Consulted and Agree with Plan of Care  Patient       Patient will benefit from skilled therapeutic intervention in order to improve the following deficits and impairments:  Pain, Postural dysfunction, Improper body mechanics, Difficulty walking, Decreased strength, Decreased activity tolerance  Visit Diagnosis: Right knee pain, unspecified chronicity  Left knee pain, unspecified chronicity  Muscle weakness (generalized)  Difficulty in walking, not elsewhere classified  Unsteadiness on feet     Problem List Patient Active Problem List   Diagnosis Date Noted  . Acute diastolic heart failure ([place]) 07/18/2018  . Persistent atrial fibrillation ([place]) 07/18/2018  . Atrial flutter ([place]) 07/18/2018  . Mild episode of recurrent major depressive disorder ([place]) 04/04/2018  . Complete heart block ([place]) 06/28/2017  . Hyperlipidemia 08/18/2016  . CKD (chronic kidney disease), stage III 08/18/2016  . Allergic rhinitis due to allergen 04/30/2016  . Pre-diabetes 10/14/2015  . Hypertensive heart disease   . Mobitz II   . Cardiac pacemaker in situ 10/16/2014  . Tachycardia 10/16/2014  . Second degree Mobitz II AV block 12/05/2013  . Syncope 12/04/2013  . History of ventricular tachycardia 08/30/2013  . Abnormal  EKG 08/30/2013  . Dizziness 08/30/2013  . Essential hypertension 08/30/2013  . Anxiety 08/30/2013  . Lymphoma in remission ([place]) 08/30/2013  . Coronary artery disease of native artery of native heart with stable angina pectoris ([place]) 02/24/2011  . Bundle branch block, right and left anterior fascicular 02/24/2011   Joneen Boers PT, DPT   02/21/2019, 2:47 PM  [facility] [address] [place], [address] Phone: [phone]   Fax:  [phone]  Name: Usama Boshers MRN: [id number] Date of Birth: 1928/01/21

## 2019-02-23 ENCOUNTER — Ambulatory Visit: Payer: Medicare Other

## 2019-02-28 ENCOUNTER — Other Ambulatory Visit: Payer: Self-pay

## 2019-02-28 ENCOUNTER — Ambulatory Visit (INDEPENDENT_AMBULATORY_CARE_PROVIDER_SITE_OTHER): Payer: Medicare Other

## 2019-02-28 ENCOUNTER — Ambulatory Visit: Payer: Medicare Other

## 2019-02-28 VITALS — Resp 16 | Ht 68.0 in | Wt 170.4 lb

## 2019-02-28 DIAGNOSIS — M25561 Pain in right knee: Secondary | ICD-10-CM

## 2019-02-28 DIAGNOSIS — M6281 Muscle weakness (generalized): Secondary | ICD-10-CM | POA: Diagnosis not present

## 2019-02-28 DIAGNOSIS — Z0000 Encounter for general adult medical examination without abnormal findings: Secondary | ICD-10-CM

## 2019-02-28 DIAGNOSIS — R262 Difficulty in walking, not elsewhere classified: Secondary | ICD-10-CM

## 2019-02-28 DIAGNOSIS — M25562 Pain in left knee: Secondary | ICD-10-CM | POA: Diagnosis not present

## 2019-02-28 DIAGNOSIS — R2681 Unsteadiness on feet: Secondary | ICD-10-CM | POA: Diagnosis not present

## 2019-02-28 NOTE — Progress Notes (Signed)
Subjective:   Clinton Joseph is a 85 y.o. male who presents for Medicare Annual/Subsequent preventive examination.  Review of Systems:   Cardiac Risk Factors include: advanced age (>33men, >65 women);dyslipidemia;hypertension     Objective:    Vitals: Resp 16   Ht 5\' 8"  (1.727 m)   Wt 170 lb 6.4 oz (77.3 kg)   BMI 25.91 kg/m   Body mass index is 25.91 kg/m.  Advanced Directives 02/28/2019 02/01/2019 05/09/2018 01/18/2018 01/04/2018 01/12/2017 12/05/2013  Does Patient Have a Medical Advance Directive? Yes Yes Yes Yes No;Yes Yes Yes  Type of Advance Directive Living will;Healthcare Power of Attorney;Out of facility DNR (pink MOST or yellow form) Living will;Healthcare Power of [place];Out of facility DNR (pink MOST or yellow form) [place];Living will [place];Living will Living will [place];Living will [place];Living will  Does patient want to make changes to medical advance directive? - - - - - - Yes - information given  Copy of [place] in Chart? No - copy requested - Yes - validated most recent copy scanned in chart (See row information) No - copy requested - No - copy requested No - copy requested  Would patient like information on creating a medical advance directive? - - - - No - Patient declined - -    Tobacco Social History   Tobacco Use  Smoking Status Never Smoker  Smokeless Tobacco Never Used     Counseling given: Not Answered   Clinical Intake:  Pre-visit preparation completed: Yes  Pain : No/denies pain     Nutritional Status: BMI 25 -29 Overweight Nutritional Risks: None Diabetes: No  How often do you need to have someone help you when you read instructions, pamphlets, or other written materials from your doctor or pharmacy?: 1 - Never  Interpreter Needed?: No  Information entered by :: Tiffany Hill,LPN  Past Medical History:  Diagnosis Date   . Anxiety state, unspecified   . Arthritis    a. Hands.  . Atrial flutter ([place])    a. Dx 03/2018-->Eliquis 5 BID started (CHA2DS2VASc = 5).  . Diffuse large B cell lymphoma ([place])    a. 2007 s/p chemo - followed by Dr. Jeb Levering.  . H/O echocardiogram    a. 11/2013 Echo: EF 55-60%, mildly dil LA, mild to mod Ao sclerosis.  . Hyperlipidemia   . Hypertensive heart disease   . Mobitz II    a. 11/2013 syncope-->s/p SJM Assurity DR model [id number] (ser # [id number]).  . Non-obstructive CAD    a. 10/2010 Cath: >M nl, LAD/LCX min irregs, RCA 40p, EF 60%.  . Tremor    a. right hand  . Urticaria, unspecified   . Ventricular tachycardia (HCC)    a. 2012->broke with amio, neg w/u.   Past Surgical History:  Procedure Laterality Date  . APPENDECTOMY  1948  . CARDIAC CATHETERIZATION  11/17/2010   [facility]- LAD min irregs, LCX min irregs, RCA 40p, EF 60%.   . CARDIOVERSION N/A 05/09/2018   Procedure: CARDIOVERSION (CATH LAB);  Surgeon: Minna Merritts, MD;  Location: [facility] ORS;  Service: Cardiovascular;  Laterality: N/A;  . CATARACT EXTRACTION, BILATERAL Bilateral ~ 2012  . INSERT / REPLACE / REMOVE PACEMAKER  12/05/2013  . PERMANENT PACEMAKER INSERTION N/A 12/05/2013   Procedure: PERMANENT PACEMAKER INSERTION;  Surgeon: Coralyn Mark, MD;  Location: [facility] CATH LAB;  Service: Cardiovascular;  Laterality: N/A;   Family History  Problem Relation Age  of Onset  . Coronary artery disease Mother   . Emphysema Father    Social History   Socioeconomic History  . Marital status: Married    Spouse name: Bonnita Nasuti   . Number of children: 1  . Years of education: Not on file  . Highest education level: Some college, no degree  Occupational History  . 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 Financial resource strain: Not hard at all  . Food insecurity    Worry: Never true    Inability: Never true  . Transportation needs    Medical: No    Non-medical: No  Tobacco Use  . Smoking status: Never  Smoker  . Smokeless tobacco: Never Used  Substance and Sexual Activity  . Alcohol use: Yes    Alcohol/week: 2.0 standard drinks    Types: 2 Glasses of wine per week  . Drug use: No  . Sexual activity: Yes  Lifestyle  . Physical activity    Days per week: 0 days    Minutes per session: 0 min  . Stress: Not at all  Relationships  . Social Herbalist on phone: Twice a week    Gets together: More than three times a week    Attends religious service: More than 4 times per year    Active member of club or organization: No    Attends meetings of clubs or organizations: Never    Relationship status: Married  Other Topics Concern  . Not on file  Social History Narrative  . Not on file    Outpatient Encounter Medications as of 02/28/2019  Medication Sig  . acetaminophen (TYLENOL) 650 MG CR tablet Take 1,300 mg by mouth daily as needed.   Marland Kitchen amiodarone (PACERONE) 200 MG tablet Take 200 mg by mouth daily.  . carvedilol (COREG) 3.125 MG tablet Take 1 tablet (3.125 mg total) by mouth 2 (two) times daily.  . cetirizine (ZYRTEC) 10 MG tablet Take 10 mg by mouth daily as needed.   . cholecalciferol (VITAMIN D3) 25 MCG (1000 UT) tablet Take 1,000 Units by mouth 2 (two) times daily.  Marland Kitchen ELIQUIS 5 MG TABS tablet TAKE 1 TABLET BY MOUTH TWICE A DAY  . ENTRESTO 24-26 MG TAKE 1 TABLET BY MOUTH TWICE A DAY  . furosemide (LASIX) 20 MG tablet Take 1 tablet (20 mg) by mouth once every other day  . Multiple Vitamins-Minerals (MULTIVITAMIN WITH MINERALS) tablet Take 1 tablet by mouth daily. Centrum Silver  . Omega-3 Fatty Acids (FISH OIL) 1000 MG CAPS Take 1,000-2,000 mg by mouth See admin instructions. Take 2 capsules (2000 mg) by mouth in the morninig & 1 capsule (1000 mg) by mouth night.  . ondansetron (ZOFRAN ODT) 4 MG disintegrating tablet Take 1 tablet (4 mg total) by mouth every 8 (eight) hours as needed for nausea or vomiting.  . vitamin B-12 (CYANOCOBALAMIN) 1000 MCG tablet Take 1,000 mcg  by mouth daily.  . [DISCONTINUED] mupirocin ointment (BACTROBAN) 2 % Apply 1 application topically 2 (two) times daily. For 7-10 days to finger (Patient not taking: Reported on 02/28/2019)   No facility-administered encounter medications on file as of 02/28/2019.     Activities of Daily Living In your present state of health, do you have any difficulty performing the following activities: 02/28/2019  Hearing? Y  Comment hearing aids  Vision? N  Comment eyeglasses. goes to dr.woodard  Difficulty concentrating or making decisions? N  Walking or climbing stairs?  N  Dressing or bathing? N  Doing errands, shopping? N  Preparing Food and eating ? N  Using the Toilet? N  In the past six months, have you accidently leaked urine? N  Do you have problems with loss of bowel control? N  Managing your Medications? N  Managing your Finances? N  Housekeeping or managing your Housekeeping? N  Some recent data might be hidden    Patient Care Team: Olin Hauser, DO as PCP - General (Family Medicine) Minna Merritts, MD as Consulting Physician (Cardiology)   Assessment:   This is a routine wellness examination for Clinton Joseph.  Exercise Activities and Dietary recommendations Current Exercise Habits: Structured exercise class, Type of exercise: strength training/weights(PT twice a week and exercises at home), Time (Minutes): 30, Frequency (Times/Week): 7, Weekly Exercise (Minutes/Week): 210, Intensity: Mild, Exercise limited by: None identified  Goals    . Increase water intake     Recommend drinking at least 6-8 glasses of water a day        Fall Risk: Fall Risk  02/28/2019 01/10/2019 06/15/2018 06/13/2018 02/23/2018  Falls in the past year? 0 0 0 0 0  Number falls in past yr: 0 - - 0 -  Injury with Fall? 0 - - 0 -  Follow up - Falls evaluation completed Falls evaluation completed - -    FALL RISK PREVENTION PERTAINING TO THE HOME:  Any stairs in or around the home? Yes  If so,  are there any without handrails? No   Home free of loose throw rugs in walkways, pet beds, electrical cords, etc? Yes  Adequate lighting in your home to reduce risk of falls? Yes   ASSISTIVE DEVICES UTILIZED TO PREVENT FALLS:  Life alert? Yes  Use of a cane, walker or w/c? Yes  cane as needed Grab bars in the bathroom? No  Shower chair or bench in shower? No  Elevated toilet seat or a handicapped toilet? No   TIMED UP AND GO:  Unable to perform   Depression Screen PHQ 2/9 Scores 02/28/2019 01/10/2019 10/04/2018 06/15/2018  PHQ - 2 Score 0 0 0 0  PHQ- 9 Score - 0 1 -    Cognitive Function     6CIT Screen 01/18/2018 01/12/2017  What Year? 0 points 0 points  What month? 0 points 0 points  What time? 0 points 0 points  Count back from 20 0 points 0 points  Months in reverse 0 points 0 points  Repeat phrase 2 points 2 points  Total Score 2 2    Immunization History  Administered Date(s) Administered  . Influenza, High Dose Seasonal PF 03/11/2015, 01/29/2016, 01/12/2017, 01/25/2018, 02/14/2019  . Influenza-Unspecified 01/18/2014  . Pneumococcal Conjugate-13 12/18/2013  . Pneumococcal-Unspecified 12/20/2011  . Tdap 11/20/2013, 01/04/2018    Qualifies for Shingles Vaccine? Yes  Zostavax completed n/a. Due for Shingrix. Education has been provided regarding the importance of this vaccine. Pt has been advised to call insurance company to determine out of pocket expense. Advised may also receive vaccine at local pharmacy or Health Dept. Verbalized acceptance and understanding.  Tdap:  Up to date   Flu Vaccine: up to date   Pneumococcal Vaccine: up to date   Screening Tests Health Maintenance  Topic Date Due  . TETANUS/TDAP  01/05/2028  . INFLUENZA VACCINE  Completed  . PNA vac Low Risk Adult  Completed   Cancer Screenings:  Colorectal Screening: no longer required   Lung Cancer Screening: (Low Dose CT Chest recommended  if Age 33-80 years, 48 pack-year currently smoking  OR have quit w/in 15years.) does not qualify.     Additional Screening:  Hepatitis C Screening: does not qualify  Vision Screening: Recommended annual ophthalmology exams for early detection of glaucoma and other disorders of the eye. Is the patient up to date with their annual eye exam?  Yes  Who is the provider or what is the name of the office in which the pt attends annual eye exams? Dr.Woodard   Dental Screening: Recommended annual dental exams for proper oral hygiene  Community Resource Referral:  CRR required this visit?  No        Plan:  I have personally reviewed and addressed the Medicare Annual Wellness questionnaire and have noted the following in the patient's chart:  A. Medical and social history B. Use of alcohol, tobacco or illicit drugs  C. Current medications and supplements D. Functional ability and status E.  Nutritional status F.  Physical activity G. Advance directives H. List of other physicians I.  Hospitalizations, surgeries, and ER visits in previous 12 months J.  [place] such as hearing and vision if needed, cognitive and depression L. Referrals and appointments   In addition, I have reviewed and discussed with patient certain preventive protocols, quality metrics, and best practice recommendations. A written personalized care plan for preventive services as well as general preventive health recommendations were provided to patient.   Signed,   Bevelyn Ngo, LPN  [id number] Nurse Health Advisor   Nurse Notes:  None

## 2019-02-28 NOTE — Patient Instructions (Signed)
Clinton Joseph , Thank you for taking time to come for your Medicare Wellness Visit. I appreciate your ongoing commitment to your health goals. Please review the following plan we discussed and let me know if I can assist you in the future.   Screening recommendations/referrals: Colonoscopy: no longer required Recommended yearly ophthalmology/optometry visit for glaucoma screening and checkup Recommended yearly dental visit for hygiene and checkup  Vaccinations: Influenza vaccine: up to date Pneumococcal vaccine: up to date Tdap vaccine: up to date Shingles vaccine: shingrix eligible     Advanced directives: Please bring a copy of your health care power of attorney and living will to the office at your convenience.  Conditions/risks identified: fall prevention discussed  Next appointment: follow up in one year for your annual wellness visit   Preventive Care 65 Years and Older, Male Preventive care refers to lifestyle choices and visits with your health care provider that can promote health and wellness. What does preventive care include?  A yearly physical exam. This is also called an annual well check.  Dental exams once or twice a year.  Routine eye exams. Ask your health care provider how often you should have your eyes checked.  Personal lifestyle choices, including:  Daily care of your teeth and gums.  Regular physical activity.  Eating a healthy diet.  Avoiding tobacco and drug use.  Limiting alcohol use.  Practicing safe sex.  Taking low doses of aspirin every day.  Taking vitamin and mineral supplements as recommended by your health care provider. What happens during an annual well check? The services and screenings done by your health care provider during your annual well check will depend on your age, overall health, lifestyle risk factors, and family history of disease. Counseling  Your health care provider may ask you questions about your:  Alcohol use.   Tobacco use.  Drug use.  Emotional well-being.  Home and relationship well-being.  Sexual activity.  Eating habits.  History of falls.  Memory and ability to understand (cognition).  Work and work Statistician. Screening  You may have the following tests or measurements:  Height, weight, and BMI.  Blood pressure.  Lipid and cholesterol levels. These may be checked every 5 years, or more frequently if you are over 63 years old.  Skin check.  Lung cancer screening. You may have this screening every year starting at age 42 if you have a 30-pack-year history of smoking and currently smoke or have quit within the past 15 years.  Fecal occult blood test (FOBT) of the stool. You may have this test every year starting at age 69.  Flexible sigmoidoscopy or colonoscopy. You may have a sigmoidoscopy every 5 years or a colonoscopy every 10 years starting at age 21.  Prostate cancer screening. Recommendations will vary depending on your family history and other risks.  Hepatitis C blood test.  Hepatitis B blood test.  Sexually transmitted disease (STD) testing.  Diabetes screening. This is done by checking your blood sugar (glucose) after you have not eaten for a while (fasting). You may have this done every 1-3 years.  Abdominal aortic aneurysm (AAA) screening. You may need this if you are a current or former smoker.  Osteoporosis. You may be screened starting at age 26 if you are at high risk. Talk with your health care provider about your test results, treatment options, and if necessary, the need for more tests. Vaccines  Your health care provider may recommend certain vaccines, such as:  Influenza vaccine.  This is recommended every year.  Tetanus, diphtheria, and acellular pertussis (Tdap, Td) vaccine. You may need a Td booster every 10 years.  Zoster vaccine. You may need this after age 68.  Pneumococcal 13-valent conjugate (PCV13) vaccine. One dose is recommended  after age 76.  Pneumococcal polysaccharide (PPSV23) vaccine. One dose is recommended after age 20. Talk to your health care provider about which screenings and vaccines you need and how often you need them. This information is not intended to replace advice given to you by your health care provider. Make sure you discuss any questions you have with your health care provider. Document Released: 05/03/2015 Document Revised: 12/25/2015 Document Reviewed: 02/05/2015 Elsevier Interactive Patient Education  2017 [place] Prevention in the Home Falls can cause injuries. They can happen to people of all ages. There are many things you can do to make your home safe and to help prevent falls. What can I do on the outside of my home?  Regularly fix the edges of walkways and driveways and fix any cracks.  Remove anything that might make you trip as you walk through a door, such as a raised step or threshold.  Trim any bushes or trees on the path to your home.  Use bright outdoor lighting.  Clear any walking paths of anything that might make someone trip, such as rocks or tools.  Regularly check to see if handrails are loose or broken. Make sure that both sides of any steps have handrails.  Any raised decks and porches should have guardrails on the edges.  Have any leaves, snow, or ice cleared regularly.  Use sand or salt on walking paths during winter.  Clean up any spills in your garage right away. This includes oil or grease spills. What can I do in the bathroom?  Use night lights.  Install grab bars by the toilet and in the tub and shower. Do not use towel bars as grab bars.  Use non-skid mats or decals in the tub or shower.  If you need to sit down in the shower, use a plastic, non-slip stool.  Keep the floor dry. Clean up any water that spills on the floor as soon as it happens.  Remove soap buildup in the tub or shower regularly.  Attach bath mats securely with  double-sided non-slip rug tape.  Do not have throw rugs and other things on the floor that can make you trip. What can I do in the bedroom?  Use night lights.  Make sure that you have a light by your bed that is easy to reach.  Do not use any sheets or blankets that are too big for your bed. They should not hang down onto the floor.  Have a firm chair that has side arms. You can use this for support while you get dressed.  Do not have throw rugs and other things on the floor that can make you trip. What can I do in the kitchen?  Clean up any spills right away.  Avoid walking on wet floors.  Keep items that you use a lot in easy-to-reach places.  If you need to reach something above you, use a strong step stool that has a grab bar.  Keep electrical cords out of the way.  Do not use floor polish or wax that makes floors slippery. If you must use wax, use non-skid floor wax.  Do not have throw rugs and other things on the floor that can make  you trip. What can I do with my stairs?  Do not leave any items on the stairs.  Make sure that there are handrails on both sides of the stairs and use them. Fix handrails that are broken or loose. Make sure that handrails are as long as the stairways.  Check any carpeting to make sure that it is firmly attached to the stairs. Fix any carpet that is loose or worn.  Avoid having throw rugs at the top or bottom of the stairs. If you do have throw rugs, attach them to the floor with carpet tape.  Make sure that you have a light switch at the top of the stairs and the bottom of the stairs. If you do not have them, ask someone to add them for you. What else can I do to help prevent falls?  Wear shoes that:  Do not have high heels.  Have rubber bottoms.  Are comfortable and fit you well.  Are closed at the toe. Do not wear sandals.  If you use a stepladder:  Make sure that it is fully opened. Do not climb a closed stepladder.  Make  sure that both sides of the stepladder are locked into place.  Ask someone to hold it for you, if possible.  Clearly mark and make sure that you can see:  Any grab bars or handrails.  First and last steps.  Where the edge of each step is.  Use tools that help you move around (mobility aids) if they are needed. These include:  Canes.  Walkers.  Scooters.  Crutches.  Turn on the lights when you go into a dark area. Replace any light bulbs as soon as they burn out.  Set up your furniture so you have a clear path. Avoid moving your furniture around.  If any of your floors are uneven, fix them.  If there are any pets around you, be aware of where they are.  Review your medicines with your doctor. Some medicines can make you feel dizzy. This can increase your chance of falling. Ask your doctor what other things that you can do to help prevent falls. This information is not intended to replace advice given to you by your health care provider. Make sure you discuss any questions you have with your health care provider. Document Released: 01/31/2009 Document Revised: 09/12/2015 Document Reviewed: 05/11/2014 Elsevier Interactive Patient Education  2017 [employer].

## 2019-02-28 NOTE — Therapy (Signed)
[facility] [address], [place], [address] Phone: [phone]   Fax:  [phone]  Physical Therapy Treatment  Patient Details  Name: Clinton Joseph MRN: [id number] Date of Birth: September 24, 1927 Referring Provider (PT): Nobie Putnam, DO   Encounter Date: 02/28/2019  PT End of Session - 02/28/19 1348    Visit Number  6    Number of Visits  17    Date for PT Re-Evaluation  03/30/19    Authorization Type  6    Authorization Time Period  of 10 Medicare    PT Start Time  2774    PT Stop Time  1429    PT Time Calculation (min)  41 min    Activity Tolerance  Patient tolerated treatment well    Behavior During Therapy  [facility] [address] for tasks assessed/performed       Past Medical History:  Diagnosis Date  . Anxiety state, unspecified   . Arthritis    a. Hands.  . Atrial flutter ([place])    a. Dx 03/2018-->Eliquis 5 BID started (CHA2DS2VASc = 5).  . Diffuse large B cell lymphoma ([place])    a. 2007 s/p chemo - followed by Dr. Jeb Levering.  . H/O echocardiogram    a. 11/2013 Echo: EF 55-60%, mildly dil LA, mild to mod Ao sclerosis.  . Hyperlipidemia   . Hypertensive heart disease   . Mobitz II    a. 11/2013 syncope-->s/p SJM Assurity DR model V7204091 (ser # [id number]).  . Non-obstructive CAD    a. 10/2010 Cath: >M nl, LAD/LCX min irregs, RCA 40p, EF 60%.  . Tremor    a. right hand  . Urticaria, unspecified   . Ventricular tachycardia (HCC)    a. 2012->broke with amio, neg w/u.    Past Surgical History:  Procedure Laterality Date  . APPENDECTOMY  1948  . CARDIAC CATHETERIZATION  11/17/2010   [facility]- LAD min irregs, LCX min irregs, RCA 40p, EF 60%.   . CARDIOVERSION N/A 05/09/2018   Procedure: CARDIOVERSION (CATH LAB);  Surgeon: Minna Merritts, MD;  Location: [facility] ORS;  Service: Cardiovascular;  Laterality: N/A;  . CATARACT EXTRACTION, BILATERAL Bilateral ~ 2012  . INSERT / REPLACE / REMOVE PACEMAKER  12/05/2013   . PERMANENT PACEMAKER INSERTION N/A 12/05/2013   Procedure: PERMANENT PACEMAKER INSERTION;  Surgeon: Coralyn Mark, MD;  Location: [facility] CATH LAB;  Service: Cardiovascular;  Laterality: N/A;    There were no vitals filed for this visit.  Subjective Assessment - 02/28/19 1349    Subjective  Both knees are doing good. 3/10 B knee pain at most for the past 7 days. Pt states that he currently carries his SPC instead of using it for safety. Uses it outside.    Pertinent History  B leg weakness, unsteady gait. Pt has been having difficulty walking for over 2 months ago. Pt currently ambulating with SPC since then. Gradual onset. Pt states having problems with his heart a while ago. Ever since he started having problems with his heart, pt starting having difficulty walking but started using his cane 2 months ago.  Both legs feel equally weak. Pt feels pulling at times at his R and L lateral knee (around lateral hamstrings). Using a heating pad helps. Currently has a pacemaker as well.    Patient Stated Goals  Walk without the cane.    Currently in Pain?  No/denies    Pain Score  0-No pain  [facility] PT Assessment - 02/28/19 1357      Berg Balance Test   Sit to Stand  Able to stand  independently using hands    Standing Unsupported  Able to stand safely 2 minutes    Sitting with Back Unsupported but Feet Supported on Floor or Stool  Able to sit safely and securely 2 minutes    Stand to Sit  Controls descent by using hands    Transfers  Able to transfer safely, definite need of hands    Standing Unsupported with Eyes Closed  Able to stand 10 seconds safely    Standing Unsupported with Feet Together  Able to place feet together independently and stand 1 minute safely   B lateral knee discomfort   From Standing, Reach Forward with Outstretched Arm  Can reach forward >5 cm safely (2")    From Standing Position, Pick up Object from Floor  Able to pick up shoe safely and easily    From Standing  Position, Turn to Look Behind Over each Shoulder  Looks behind from both sides and weight shifts well    Turn 360 Degrees  Able to turn 360 degrees safely but slowly    Standing Unsupported, Alternately Place Feet on Step/Stool  Able to complete >2 steps/needs minimal assist    Standing Unsupported, One Foot in Front  Able to take small step independently and hold 30 seconds    Standing on One Leg  Unable to try or needs assist to prevent fall    Total Score  40                           PT Education - 02/28/19 1941    Education Details  ther-ex, progress/current status with PT towards goals    Person(s) Educated  Patient    Methods  Explanation;Demonstration;Tactile cues;Verbal cues    Comprehension  Returned demonstration;Verbalized understanding       Objectives  PACEMAKER    Pt observed to ambulate into clinic without using his SPC. No pain in B knees 02/14/2019  [place] Access Code: [email or web address]   Last scheduled appointment: 03/14/2019    Therapeutic exercise  Gait x 164 ft without AD, no LOB, steady  Directed patient with sit <> stand throughout session Stand pivot transfer chair <> low mat table Static standing shoulder width apart, then with eyes closed, then with eyes open feet together, tandem stance with feet shoulder width apart Picking up an object (1 lb dumbbell ) from the floor,  Turning 360 degrees to the R and to the L 1x Looking behind to the R and to the L,  Standing forward reach,   Standing alternate toe taps 4 x each LE onto first regular step  Reviewed progress with PT towards goals   Improved exercise technique, movement at target joints, use of target muscles after mod verbal, visual, tactile cues.     Manual therapy  Seated STM B lateral hamstrings, IT band, vastus lateralis   Response to treatment Pt tolerated session well without aggravation of symptoms   Clinical impression Pt demonstrates  overall improved B knee pain, as well as Berg Balance score since initial evaluation. Pt also better able to ambulate wihtout use of AD based on subjective reports and clinical observation. Pt making very good progress with PT towards goals. Pt will benefit from continued skilled physical therapy services to decrease B knee pain, improve strength, balance, and function.  PT Short Term Goals - 02/28/19 1946      PT SHORT TERM GOAL #1   Title  Pt will be independent with his HEP to decrease B knee pain, improve balance and decrease difficulty with gait.    Time  3    Period  Weeks    Status  Achieved    Target Date  02/23/19        PT Long Term Goals - 02/28/19 1943      PT LONG TERM GOAL #1   Title  Patient will have a decrease in B knee pain to 3/10 or less at worst to promote ability to amblate more steadily and decrease need for AD.    Baseline  6/10 bilateral lateral knee pain at most for the past 6 months (02/01/2019); 3/10 B knee pain at most for the past 7 days (02/28/2019)    Time  8    Period  Weeks    Status  Achieved    Target Date  03/30/19      PT LONG TERM GOAL #2   Title  Patient will improve bilateral hip abduction and extension strength by at least 1/2 MMT grade to promote ability to ambulate with less difficulty and more balance.    Baseline  Hip extension: 3+/5 R and L, hip abduction: 4-/5 R and L (02/01/2019)    Time  8    Period  Weeks    Status  On-going    Target Date  03/30/19      PT LONG TERM GOAL #3   Title  Patient will improve his Berg Balance test score to 46/56 or more as a demonstration of improved balance and decreased need for AD.    Baseline  38/56 (02/01/2019); 40/56 (02/28/2019)    Time  8    Period  Weeks    Status  Partially Met    Target Date  03/30/19      PT LONG TERM GOAL #4   Title  Pt will be able to ambulate at least 100 ft independently and not LOB to promote return to prior level of function    Baseline  Currently  ambulating with SPC (02/01/2019); 164 ft no AD, no LOB, even surface (02/28/2019)    Time  8    Period  Weeks    Status  Achieved    Target Date  03/30/19            Plan - 02/28/19 1942    Clinical Impression Statement  Pt demonstrates overall improved B knee pain, as well as Berg Balance score since initial evaluation. Pt also better able to ambulate wihtout use of AD based on subjective reports and clinical observation. Pt making very good progress with PT towards goals. Pt will benefit from continued skilled physical therapy services to decrease B knee pain, improve strength, balance, and function.    Personal Factors and Comorbidities  Age;Comorbidity 3+;Fitness    Comorbidities  Pacemaker, ventricular tachycardia, sedentary lifestyle (per pt reports), CAD, HTN, lymphoma, arthritis, atrial flutter.    Examination-Activity Limitations  Stairs;Patent attorney for Others;Carry    Stability/Clinical Decision Making  Evolving/Moderate complexity    Rehab Potential  Fair    PT Frequency  2x / week    PT Duration  8 weeks    PT Treatment/Interventions  Aquatic Therapy;Gait training;Functional mobility training;Therapeutic activities;Therapeutic exercise;Balance training;Neuromuscular re-education;Patient/family education;Manual techniques;Dry needling    PT Next Visit Plan  hip strengthening, balance, manual techniques PRN  Consulted and Agree with Plan of Care  Patient       Patient will benefit from skilled therapeutic intervention in order to improve the following deficits and impairments:  Pain, Postural dysfunction, Improper body mechanics, Difficulty walking, Decreased strength, Decreased activity tolerance  Visit Diagnosis: Right knee pain, unspecified chronicity  Left knee pain, unspecified chronicity  Muscle weakness (generalized)  Difficulty in walking, not elsewhere classified  Unsteadiness on feet     Problem List Patient Active Problem List    Diagnosis Date Noted  . Acute diastolic heart failure ([place]) 07/18/2018  . Persistent atrial fibrillation ([place]) 07/18/2018  . Atrial flutter ([place]) 07/18/2018  . Mild episode of recurrent major depressive disorder ([place]) 04/04/2018  . Complete heart block ([place]) 06/28/2017  . Hyperlipidemia 08/18/2016  . CKD (chronic kidney disease), stage III 08/18/2016  . Allergic rhinitis due to allergen 04/30/2016  . Pre-diabetes 10/14/2015  . Hypertensive heart disease   . Mobitz II   . Cardiac pacemaker in situ 10/16/2014  . Tachycardia 10/16/2014  . Second degree Mobitz II AV block 12/05/2013  . Syncope 12/04/2013  . History of ventricular tachycardia 08/30/2013  . Abnormal EKG 08/30/2013  . Dizziness 08/30/2013  . Essential hypertension 08/30/2013  . Anxiety 08/30/2013  . Lymphoma in remission ([place]) 08/30/2013  . Coronary artery disease of native artery of native heart with stable angina pectoris ([place]) 02/24/2011  . Bundle branch block, right and left anterior fascicular 02/24/2011    Joneen Boers PT, DPT   02/28/2019, 7:48 PM  [facility] [address] [place], [address] Phone: [phone]   Fax:  [phone]  Name: Clinton Joseph MRN: [id number] Date of Birth: 1928-01-26

## 2019-03-02 ENCOUNTER — Ambulatory Visit: Payer: Medicare Other

## 2019-03-02 ENCOUNTER — Other Ambulatory Visit: Payer: Self-pay

## 2019-03-02 DIAGNOSIS — M25561 Pain in right knee: Secondary | ICD-10-CM

## 2019-03-02 DIAGNOSIS — R2681 Unsteadiness on feet: Secondary | ICD-10-CM

## 2019-03-02 DIAGNOSIS — R262 Difficulty in walking, not elsewhere classified: Secondary | ICD-10-CM

## 2019-03-02 DIAGNOSIS — M25562 Pain in left knee: Secondary | ICD-10-CM

## 2019-03-02 DIAGNOSIS — M6281 Muscle weakness (generalized): Secondary | ICD-10-CM

## 2019-03-02 NOTE — Therapy (Signed)
[facility] [address], [place], [address] Phone: [phone]   Fax:  [phone]  Physical Therapy Treatment  Patient Details  Name: Clinton Joseph MRN: [id number] Date of Birth: 1927-06-01 Referring Provider (PT): Nobie Putnam, DO   Encounter Date: 03/02/2019  PT End of Session - 03/02/19 1334    Visit Number  7    Number of Visits  17    Date for PT Re-Evaluation  03/30/19    Authorization Type  7    Authorization Time Period  of 10 Medicare    PT Start Time  7681    PT Stop Time  1416    PT Time Calculation (min)  42 min    Activity Tolerance  Patient tolerated treatment well    Behavior During Therapy  [facility] for tasks assessed/performed       Past Medical History:  Diagnosis Date  . Anxiety state, unspecified   . Arthritis    a. Hands.  . Atrial flutter ([place])    a. Dx 03/2018-->Eliquis 5 BID started (CHA2DS2VASc = 5).  . Diffuse large B cell lymphoma ([place])    a. 2007 s/p chemo - followed by Dr. Jeb Levering.  . H/O echocardiogram    a. 11/2013 Echo: EF 55-60%, mildly dil LA, mild to mod Ao sclerosis.  . Hyperlipidemia   . Hypertensive heart disease   . Mobitz II    a. 11/2013 syncope-->s/p SJM Assurity DR model [id number] (ser # [id number]).  . Non-obstructive CAD    a. 10/2010 Cath: >M nl, LAD/LCX min irregs, RCA 40p, EF 60%.  . Tremor    a. right hand  . Urticaria, unspecified   . Ventricular tachycardia (HCC)    a. 2012->broke with amio, neg w/u.    Past Surgical History:  Procedure Laterality Date  . APPENDECTOMY  1948  . CARDIAC CATHETERIZATION  11/17/2010   [facility]- LAD min irregs, LCX min irregs, RCA 40p, EF 60%.   . CARDIOVERSION N/A 05/09/2018   Procedure: CARDIOVERSION (CATH LAB);  Surgeon: Minna Merritts, MD;  Location: [facility] ORS;  Service: Cardiovascular;  Laterality: N/A;  . CATARACT EXTRACTION, BILATERAL Bilateral ~ 2012  . INSERT / REPLACE / REMOVE PACEMAKER  12/05/2013   . PERMANENT PACEMAKER INSERTION N/A 12/05/2013   Procedure: PERMANENT PACEMAKER INSERTION;  Surgeon: Coralyn Mark, MD;  Location: [facility] CATH LAB;  Service: Cardiovascular;  Laterality: N/A;    There were no vitals filed for this visit.  Subjective Assessment - 03/02/19 1336    Subjective  Both knees are doing fine. Bent over to pick up something from the floor yesterday in his bedroom and fell. Pt also hit his head in the front. Does not feel like he broke anything. No pain anywhere. No headaches, or blurred vision, no hip or back pain. Pt states leaning forward too much. Pt did not black out, did not get dizzy. Pt states that he also feels like he can stop PT after next visit and continue with his HEP.    Pertinent History  B leg weakness, unsteady gait. Pt has been having difficulty walking for over 2 months ago. Pt currently ambulating with SPC since then. Gradual onset. Pt states having problems with his heart a while ago. Ever since he started having problems with his heart, pt starting having difficulty walking but started using his cane 2 months ago.  Both legs feel equally weak. Pt feels pulling at times at his R and  L lateral knee (around lateral hamstrings). Using a heating pad helps. Currently has a pacemaker as well.    Patient Stated Goals  Walk without the cane.    Currently in Pain?  No/denies    Pain Score  0-No pain                               PT Education - 03/02/19 1342    Education Details  ther-ex    Person(s) Educated  Patient    Methods  Explanation;Demonstration;Tactile cues;Verbal cues    Comprehension  Returned demonstration;Verbalized understanding       Objectives  PACEMAKER    Pt observed to ambulate into clinic without using his SPC. No pain in B knees 02/14/2019  Meebridge Access Code: [email or web address]  Therapeutic exercise  Standing knee flexion with B UE assist to promote concentric hamstring contraction to promote proper  stress to lateral hamstrings tendons for healing.   R 10x3  L 10x3  side stepping 29 ft to the R and 29 ft to the L without UE assist    Four square step exercise 2x CGA. Max cues for increasing step length especially when stepping to the side and back.   Standing and picking up 1 lb weigh from the floor, emphasis on maintaining center of gravity between base of support (feet)  2x2. Cues to also get closer to object so pt does not have to reach too far forward.    Pt able to pick up object from the floor independently without LOB, just needing cues to get closer to the object prior to picking it up so as to not lean too far forward. Pt was recommended to hold onto something sturdy at home when picking up an item from the floor at home or to move closer to the object prior to squatting down to pick it up so he does not tip over forward if there is nothing to hold on to. Pt verbalized understanding.    Improved exercise technique, movement at target joints, use of target muscles after mod verbal, visual, tactile cues.    Pt states that he feels like he can graduate PT after next session and continue with his exercises at home. Pt to call back for more appointments if he feels like he needs to.    Manual therapy Seated STM B lateral hamstrings, IT band, vastus lateralis Seated STM medial knees   Response to treatment Pt tolerated session well without aggravation of symptoms   Clinical impression Pt able to pick up 1 lb weight from the floor 4 times total independently and without LOB, needing cues to get closer to the object so as to not lean too far forward when doing so. Continued working on providing proper stress to bilateral lateral hamstrings to promote proper healing, as well as glute med strengthening to promote single leg balance. Also worked on stepping in different directions to promote dynamic balance. No complain of pain and no LOB throughout session. Pt will benefit  from continued skilled physical therapy services to decrease bilateral knee pain, improve LE strength and function.     PT Short Term Goals - 02/28/19 1946      PT SHORT TERM GOAL #1   Title  Pt will be independent with his HEP to decrease B knee pain, improve balance and decrease difficulty with gait.    Time  3    Period  Weeks  Status  Achieved    Target Date  02/23/19        PT Long Term Goals - 02/28/19 1943      PT LONG TERM GOAL #1   Title  Patient will have a decrease in B knee pain to 3/10 or less at worst to promote ability to amblate more steadily and decrease need for AD.    Baseline  6/10 bilateral lateral knee pain at most for the past 6 months (02/01/2019); 3/10 B knee pain at most for the past 7 days (02/28/2019)    Time  8    Period  Weeks    Status  Achieved    Target Date  03/30/19      PT LONG TERM GOAL #2   Title  Patient will improve bilateral hip abduction and extension strength by at least 1/2 MMT grade to promote ability to ambulate with less difficulty and more balance.    Baseline  Hip extension: 3+/5 R and L, hip abduction: 4-/5 R and L (02/01/2019)    Time  8    Period  Weeks    Status  On-going    Target Date  03/30/19      PT LONG TERM GOAL #3   Title  Patient will improve his Berg Balance test score to 46/56 or more as a demonstration of improved balance and decreased need for AD.    Baseline  38/56 (02/01/2019); 40/56 (02/28/2019)    Time  8    Period  Weeks    Status  Partially Met    Target Date  03/30/19      PT LONG TERM GOAL #4   Title  Pt will be able to ambulate at least 100 ft independently and not LOB to promote return to prior level of function    Baseline  Currently ambulating with SPC (02/01/2019); 164 ft no AD, no LOB, even surface (02/28/2019)    Time  8    Period  Weeks    Status  Achieved    Target Date  03/30/19            Plan - 03/02/19 1343    Clinical Impression Statement  Pt able to pick up 1 lb weight  from the floor 4 times total independently and without LOB, needing cues to get closer to the object so as to not lean too far forward when doing so. Continued working on providing proper stress to bilateral lateral hamstrings to promote proper healing, as well as glute med strengthening to promote single leg balance. Also worked on stepping in different directions to promote dynamic balance. No complain of pain and no LOB throughout session. Pt will benefit from continued skilled physical therapy services to decrease bilateral knee pain, improve LE strength and function.    Personal Factors and Comorbidities  Age;Comorbidity 3+;Fitness    Comorbidities  Pacemaker, ventricular tachycardia, sedentary lifestyle (per pt reports), CAD, HTN, lymphoma, arthritis, atrial flutter.    Examination-Activity Limitations  Stairs;Patent attorney for Others;Carry    Stability/Clinical Decision Making  Evolving/Moderate complexity    Rehab Potential  Fair    PT Frequency  2x / week    PT Duration  8 weeks    PT Treatment/Interventions  Aquatic Therapy;Gait training;Functional mobility training;Therapeutic activities;Therapeutic exercise;Balance training;Neuromuscular re-education;Patient/family education;Manual techniques;Dry needling    PT Next Visit Plan  hip strengthening, balance, manual techniques PRN    Consulted and Agree with Plan of Care  Patient  Patient will benefit from skilled therapeutic intervention in order to improve the following deficits and impairments:  Pain, Postural dysfunction, Improper body mechanics, Difficulty walking, Decreased strength, Decreased activity tolerance  Visit Diagnosis: Right knee pain, unspecified chronicity  Left knee pain, unspecified chronicity  Muscle weakness (generalized)  Difficulty in walking, not elsewhere classified  Unsteadiness on feet     Problem List Patient Active Problem List   Diagnosis Date Noted  . Acute diastolic heart  failure ([place]) 07/18/2018  . Persistent atrial fibrillation ([place]) 07/18/2018  . Atrial flutter ([place]) 07/18/2018  . Mild episode of recurrent major depressive disorder ([place]) 04/04/2018  . Complete heart block ([place]) 06/28/2017  . Hyperlipidemia 08/18/2016  . CKD (chronic kidney disease), stage III 08/18/2016  . Allergic rhinitis due to allergen 04/30/2016  . Pre-diabetes 10/14/2015  . Hypertensive heart disease   . Mobitz II   . Cardiac pacemaker in situ 10/16/2014  . Tachycardia 10/16/2014  . Second degree Mobitz II AV block 12/05/2013  . Syncope 12/04/2013  . History of ventricular tachycardia 08/30/2013  . Abnormal EKG 08/30/2013  . Dizziness 08/30/2013  . Essential hypertension 08/30/2013  . Anxiety 08/30/2013  . Lymphoma in remission ([place]) 08/30/2013  . Coronary artery disease of native artery of native heart with stable angina pectoris ([place]) 02/24/2011  . Bundle branch block, right and left anterior fascicular 02/24/2011     Joneen Boers PT, DPT   03/02/2019, 2:41 PM  [facility] [address] [place], [address] Phone: [phone]   Fax:  [phone]  Name: Clinton Joseph MRN: [id number] Date of Birth: 07-Dec-1927

## 2019-03-07 ENCOUNTER — Other Ambulatory Visit: Payer: Self-pay

## 2019-03-07 ENCOUNTER — Ambulatory Visit: Payer: Medicare Other

## 2019-03-07 DIAGNOSIS — M6281 Muscle weakness (generalized): Secondary | ICD-10-CM | POA: Diagnosis not present

## 2019-03-07 DIAGNOSIS — R2681 Unsteadiness on feet: Secondary | ICD-10-CM | POA: Diagnosis not present

## 2019-03-07 DIAGNOSIS — M25561 Pain in right knee: Secondary | ICD-10-CM

## 2019-03-07 DIAGNOSIS — M25562 Pain in left knee: Secondary | ICD-10-CM | POA: Diagnosis not present

## 2019-03-07 DIAGNOSIS — R262 Difficulty in walking, not elsewhere classified: Secondary | ICD-10-CM

## 2019-03-07 NOTE — Therapy (Signed)
[facility] [address], [place], [address] Phone: [phone]   Fax:  [phone]  Physical Therapy Treatment  Patient Details  Name: Clinton Joseph  MRN: [id number] Date of Birth: Nov 16, 1927 Referring Provider (PT): Nobie Putnam, DO   Encounter Date: 03/07/2019  PT End of Session - 03/07/19 1352    Visit Number  8    Number of Visits  17    Date for PT Re-Evaluation  04/06/19    Authorization Type  8    Authorization Time Period  of 10 Medicare    PT Start Time  1352    PT Stop Time  1433    PT Time Calculation (min)  41 min    Activity Tolerance  Patient tolerated treatment well    Behavior During Therapy  [facility] for tasks assessed/performed       Past Medical History:  Diagnosis Date  . Anxiety state, unspecified   . Arthritis    a. Hands.  . Atrial flutter ([place])    a. Dx 03/2018-->Eliquis 5 BID started (CHA2DS2VASc = 5).  . Diffuse large B cell lymphoma ([place])    a. 2007 s/p chemo - followed by Dr. Jeb Levering.  . H/O echocardiogram    a. 11/2013 Echo: EF 55-60%, mildly dil LA, mild to mod Ao sclerosis.  . Hyperlipidemia   . Hypertensive heart disease   . Mobitz II    a. 11/2013 syncope-->s/p SJM Assurity DR model [id number] (ser # [id number]).  . Non-obstructive CAD    a. 10/2010 Cath: >M nl, LAD/LCX min irregs, RCA 40p, EF 60%.  . Tremor    a. right hand  . Urticaria, unspecified   . Ventricular tachycardia (HCC)    a. 2012->broke with amio, neg w/u.    Past Surgical History:  Procedure Laterality Date  . APPENDECTOMY  1948  . CARDIAC CATHETERIZATION  11/17/2010   [facility]- LAD min irregs, LCX min irregs, RCA 40p, EF 60%.   . CARDIOVERSION N/A 05/09/2018   Procedure: CARDIOVERSION (CATH LAB);  Surgeon: Minna Merritts, MD;  Location: [facility] ORS;  Service: Cardiovascular;  Laterality: N/A;  . CATARACT EXTRACTION, BILATERAL Bilateral ~ 2012  . INSERT / REPLACE / REMOVE PACEMAKER   12/05/2013  . PERMANENT PACEMAKER INSERTION N/A 12/05/2013   Procedure: PERMANENT PACEMAKER INSERTION;  Surgeon: Coralyn Mark, MD;  Location: [facility] CATH LAB;  Service: Cardiovascular;  Laterality: N/A;    There were no vitals filed for this visit.  Subjective Assessment - 03/07/19 1354    Subjective  Knees are good. No pain currently. Maybe 4/10 at most for the past 7 days. No falls since last time. Feels good with the exercises and feels like it helps him with his knee pain and balance. Wants to try doing the HEP on his own instead of going to the clinic even though he fell last week. Wants to be able to come back to PT if he needs it.    Pertinent History  B leg weakness, unsteady gait. Pt has been having difficulty walking for over 2 months ago. Pt currently ambulating with SPC since then. Gradual onset. Pt states having problems with his heart a while ago. Ever since he started having problems with his heart, pt starting having difficulty walking but started using his cane 2 months ago.  Both legs feel equally weak. Pt feels pulling at times at his R and L lateral knee (around lateral hamstrings). Using a heating pad  helps. Currently has a pacemaker as well.    Patient Stated Goals  Walk without the cane.    Currently in Pain?  No/denies    Pain Score  0-No pain         [facility] PT Assessment - 03/07/19 1357      Strength   Right Hip Extension  4/5   seated manually resisted   Right Hip ABduction  5/5   seated manually resisted clamshell isometric   Left Hip Extension  4-/5   seated manually resisted   Left Hip ABduction  5/5   seated manually resisted clamshell isometric     Berg Balance Test   Sit to Stand  Able to stand without using hands and stabilize independently    Standing Unsupported  Able to stand safely 2 minutes    Sitting with Back Unsupported but Feet Supported on Floor or Stool  Able to sit safely and securely 2 minutes    Stand to Sit  Sits safely with minimal use of  hands    Transfers  Able to transfer safely, minor use of hands    Standing Unsupported with Eyes Closed  Able to stand 10 seconds safely    Standing Unsupported with Feet Together  Able to place feet together independently and stand 1 minute safely   bilateral lateral knee strain   From Standing, Reach Forward with Outstretched Arm  Can reach forward >5 cm safely (2")    From Standing Position, Pick up Object from Floor  Able to pick up shoe safely and easily    From Standing Position, Turn to Look Behind Over each Shoulder  Looks behind from both sides and weight shifts well    Turn 360 Degrees  Able to turn 360 degrees safely but slowly    Standing Unsupported, Alternately Place Feet on Step/Stool  Able to complete >2 steps/needs minimal assist    Standing Unsupported, One Foot in Front  Able to take small step independently and hold 30 seconds    Standing on One Leg  Tries to lift leg/unable to hold 3 seconds but remains standing independently    Total Score  44                           PT Education - 03/07/19 1602    Education Details  ther-ex, plan of care    Person(s) Educated  Patient    Methods  Explanation;Demonstration;Tactile cues;Verbal cues    Comprehension  Returned demonstration;Verbalized understanding      Objectives  PACEMAKER    Pt observed to ambulate into clinic without using his SPC. No pain in B knees 02/14/2019  MeebridgeAccess Code: [email or web address]    Manual therapy Seated STM B lateral hamstrings, IT band, vastus lateralis Seated STM medial knees    Therapeutic exercise  Seated manually resisted hip extension, clamshell isometrics 1-2x each way for each LE   Directed patient with sit <> stand throughout session Stand pivot transfer chair <> low mat table Static standing shoulder width apart, then with eyes closed, then with eyes open feet together, tandem stance with feet shoulder width apart Picking up an object  (computer mouse) from the floor,  Turning 360 degrees to the R and to the L 1x Looking behind to the R and to the L,  Standing forward reach,   Standing alternate toe taps 4 x each LE onto first regular step  Reviewed progress/current status with PT towards  goals   Pt wants to do his HEP to see how he does even with the recent fall. Pt states feeling fine to do so. Pt to return after 4 weeks for a follow up, week of 04/06/2019      Improved exercise technique, movement at target joints, use of target muscles after mod verbal, visual, tactile cues.   Response to treatment Pt tolerated session well without aggravation of symptoms   Clinical impression Pt is making very good progress with decreased B knee pain, improved strength, and balance since initial evaluation. Pt also demonstrates good compliance with his HEP based on subjective reports. Pt to contiue with his HEP for the next 4 weeks and return for a follow up session to check on progress based on pt input, and states feeling comfortable with it even with his one fall last week when picking up an item from his bedroom floor. Worked on fall prevention last session when picking up an item from the floor and pt was able to perform well without loss of balance.  Pt improved Berg Balance score to 44/56 today from previous measurement of 40/56 last week.  Pt will benefit from continued skilled physical therapy services to continue to decrease B knee pain, improve strength, balance, and function.      PT Short Term Goals - 02/28/19 1946      PT SHORT TERM GOAL #1   Title  Pt will be independent with his HEP to decrease B knee pain, improve balance and decrease difficulty with gait.    Time  3    Period  Weeks    Status  Achieved    Target Date  02/23/19        PT Long Term Goals - 03/07/19 1612      PT LONG TERM GOAL #1   Title  Patient will have a decrease in B knee pain to 3/10 or less at worst to promote ability to  amblate more steadily and decrease need for AD.    Baseline  6/10 bilateral lateral knee pain at most for the past 6 months (02/01/2019); 3/10 B knee pain at most for the past 7 days (02/28/2019); 4/10 (03/07/2019)    Time  4    Period  Weeks    Status  On-going    Target Date  04/06/19      PT LONG TERM GOAL #2   Title  Patient will improve bilateral hip abduction and extension strength by at least 1/2 MMT grade to promote ability to ambulate with less difficulty and more balance.    Baseline  Hip extension: 3+/5 R and L, hip abduction: 4-/5 R and L (02/01/2019); hip extension: 4/5 R, 4-/5 L, hip abduction 5/5 R and L (03/07/2019)    Time  6    Period  Weeks    Status  Achieved    Target Date  04/06/19      PT LONG TERM GOAL #3   Title  Patient will improve his Berg Balance test score to 46/56 or more as a demonstration of improved balance and decreased need for AD.    Baseline  38/56 (02/01/2019); 40/56 (02/28/2019); 44/56 (03/07/2019)    Time  4    Period  Weeks    Status  Partially Met    Target Date  04/06/19      PT LONG TERM GOAL #4   Title  Pt will be able to ambulate at least 100 ft independently and not LOB  to promote return to prior level of function    Baseline  Currently ambulating with SPC (02/01/2019); 164 ft no AD, no LOB, even surface (02/28/2019)    Time  8    Period  Weeks    Status  Achieved    Target Date  03/30/19            Plan - 03/07/19 1602    Clinical Impression Statement  Pt is making very good progress with decreased B knee pain, improved strength, and balance since initial evaluation. Pt also demonstrates good compliance with his HEP based on subjective reports. Pt to contiue with his HEP for the next 4 weeks and return for a follow up session to check on progress based on pt input, and states feeling comfortable with it even with his one fall last week when picking up an item from his bedroom floor. Worked on fall prevention last session when  picking up an item from the floor and pt was able to perform well without loss of balance.  Pt improved Berg Balance score to 44/56 today from previous measurement of 40/56 last week.  Pt will benefit from continued skilled physical therapy services to continue to decrease B knee pain, improve strength, balance, and function.    Personal Factors and Comorbidities  Age;Comorbidity 3+;Fitness    Comorbidities  Pacemaker, ventricular tachycardia, sedentary lifestyle (per pt reports), CAD, HTN, lymphoma, arthritis, atrial flutter.    Examination-Activity Limitations  Stairs;Patent attorney for Others;Carry    Stability/Clinical Decision Making  Evolving/Moderate complexity    Clinical Decision Making  Low    Rehab Potential  Fair    PT Frequency  --    PT Duration  Other (comment)   one follow up session in 4 weeks   PT Treatment/Interventions  Aquatic Therapy;Gait training;Functional mobility training;Therapeutic activities;Therapeutic exercise;Balance training;Neuromuscular re-education;Patient/family education;Manual techniques;Dry needling    PT Next Visit Plan  HEP to continue progress    Consulted and Agree with Plan of Care  Patient       Patient will benefit from skilled therapeutic intervention in order to improve the following deficits and impairments:  Pain, Postural dysfunction, Improper body mechanics, Difficulty walking, Decreased strength, Decreased activity tolerance  Visit Diagnosis: Right knee pain, unspecified chronicity - Plan: PT plan of care cert/re-cert  Left knee pain, unspecified chronicity - Plan: PT plan of care cert/re-cert  Muscle weakness (generalized) - Plan: PT plan of care cert/re-cert  Difficulty in walking, not elsewhere classified - Plan: PT plan of care cert/re-cert  Unsteadiness on feet - Plan: PT plan of care cert/re-cert     Problem List Patient Active Problem List   Diagnosis Date Noted  . Acute diastolic heart failure ([place]) 07/18/2018   . Persistent atrial fibrillation ([place]) 07/18/2018  . Atrial flutter ([place]) 07/18/2018  . Mild episode of recurrent major depressive disorder ([place]) 04/04/2018  . Complete heart block ([place]) 06/28/2017  . Hyperlipidemia 08/18/2016  . CKD (chronic kidney disease), stage III 08/18/2016  . Allergic rhinitis due to allergen 04/30/2016  . Pre-diabetes 10/14/2015  . Hypertensive heart disease   . Mobitz II   . Cardiac pacemaker in situ 10/16/2014  . Tachycardia 10/16/2014  . Second degree Mobitz II AV block 12/05/2013  . Syncope 12/04/2013  . History of ventricular tachycardia 08/30/2013  . Abnormal EKG 08/30/2013  . Dizziness 08/30/2013  . Essential hypertension 08/30/2013  . Anxiety 08/30/2013  . Lymphoma in remission ([place]) 08/30/2013  . Coronary artery disease of native artery of  native heart with stable angina pectoris ([place]) 02/24/2011  . Bundle branch block, right and left anterior fascicular 02/24/2011    Joneen Boers PT, DPT   03/07/2019, 4:18 PM  [facility] [address] [place], [address] Phone: [phone]   Fax:  [phone]  Name: Trentin Knappenberger MRN: [id number] Date of Birth: 1928/04/17

## 2019-03-17 LAB — CUP PACEART REMOTE DEVICE CHECK
Battery Remaining Longevity: 107 mo
Battery Remaining Percentage: 95.5 %
Battery Voltage: 2.99 V
Brady Statistic AP VP Percent: 77 %
Brady Statistic AP VS Percent: 1 %
Brady Statistic AS VP Percent: 23 %
Brady Statistic AS VS Percent: 1 %
Brady Statistic RA Percent Paced: 76 %
Brady Statistic RV Percent Paced: 99 %
Date Time Interrogation Session: 20201125031638
Implantable Lead Implant Date: 20150818
Implantable Lead Implant Date: 20150818
Implantable Lead Location: 753859
Implantable Lead Location: 753860
Implantable Lead Model: 1948
Implantable Pulse Generator Implant Date: 20150818
Lead Channel Impedance Value: 380 Ohm
Lead Channel Impedance Value: 760 Ohm
Lead Channel Pacing Threshold Amplitude: 0.5 V
Lead Channel Pacing Threshold Amplitude: 0.5 V
Lead Channel Pacing Threshold Pulse Width: 0.5 ms
Lead Channel Pacing Threshold Pulse Width: 0.5 ms
Lead Channel Sensing Intrinsic Amplitude: 10 mV
Lead Channel Sensing Intrinsic Amplitude: 4.2 mV
Lead Channel Setting Pacing Amplitude: 2 V
Lead Channel Setting Pacing Amplitude: 2.5 V
Lead Channel Setting Pacing Pulse Width: 0.5 ms
Lead Channel Setting Sensing Sensitivity: 4 mV
Pulse Gen Model: 2240
Pulse Gen Serial Number: 7664739

## 2019-03-20 ENCOUNTER — Ambulatory Visit (INDEPENDENT_AMBULATORY_CARE_PROVIDER_SITE_OTHER): Payer: Medicare Other | Admitting: *Deleted

## 2019-03-20 DIAGNOSIS — I442 Atrioventricular block, complete: Secondary | ICD-10-CM

## 2019-04-04 ENCOUNTER — Ambulatory Visit: Payer: Medicare Other

## 2019-04-06 ENCOUNTER — Other Ambulatory Visit: Payer: Self-pay | Admitting: Physician Assistant

## 2019-04-06 NOTE — Telephone Encounter (Signed)
Please see note below didn't route correctly.

## 2019-04-06 NOTE — Telephone Encounter (Signed)
Please advise if ok to refill Amiodarone 200 mg tablet qd.  Last filled Historical Provider.

## 2019-04-10 ENCOUNTER — Ambulatory Visit: Payer: Medicare Other | Attending: Internal Medicine

## 2019-04-10 DIAGNOSIS — Z20822 Contact with and (suspected) exposure to covid-19: Secondary | ICD-10-CM

## 2019-04-11 ENCOUNTER — Ambulatory Visit: Payer: Medicare Other | Attending: Family Medicine

## 2019-04-11 LAB — NOVEL CORONAVIRUS, NAA: SARS-CoV-2, NAA: NOT DETECTED

## 2019-04-12 NOTE — Progress Notes (Signed)
PPM remote 

## 2019-04-25 ENCOUNTER — Telehealth: Payer: Self-pay

## 2019-04-25 ENCOUNTER — Ambulatory Visit: Payer: PPO | Attending: Family Medicine

## 2019-04-25 NOTE — Telephone Encounter (Signed)
No show. Called pt and left message pertaining to today's appointment and checking up on him to see how he is doing. Return phone call requested. Phone number [phone]) provided.

## 2019-05-12 ENCOUNTER — Telehealth: Payer: Self-pay | Admitting: Family Medicine

## 2019-05-12 NOTE — Chronic Care Management (AMB) (Signed)
  Chronic Care Management   Note  05/12/2019 Name: Clinton Joseph MRN: [id number] DOB: [birth year]  [name] is a 86 y.o. year old male who is a primary care patient of Olin Hauser, DO. I reached out to [name] by phone today in response to a referral sent by [name] [facility] health plan.     [name] was given information about Chronic Care Management services today including:  1. CCM service includes personalized support from designated clinical staff supervised by his physician, including individualized plan of care and coordination with other care providers 2. 24/7 contact phone numbers for assistance for urgent and routine care needs. 3. Service will only be billed when office clinical staff spend 20 minutes or more in a month to coordinate care. 4. Only one practitioner may furnish and bill the service in a calendar month. 5. The patient may stop CCM services at any time (effective at the end of the month) by phone call to the office staff. 6. The patient will be responsible for cost sharing (co-pay) of up to 20% of the service fee (after annual deductible is met).  Patient did not agree to enrollment in care management services and does not wish to consider at this time.  Follow up plan: The patient has been provided with contact information for the care management team and has been advised to call with any health related questions or concerns.   [place], [place] [address] Direct Dial: [email or web address]  Website: [email or web address]

## 2019-05-16 ENCOUNTER — Ambulatory Visit: Payer: PPO | Admitting: Physician Assistant

## 2019-05-22 ENCOUNTER — Encounter: Payer: Self-pay | Admitting: Family

## 2019-05-22 ENCOUNTER — Other Ambulatory Visit: Payer: Self-pay

## 2019-05-22 ENCOUNTER — Ambulatory Visit (INDEPENDENT_AMBULATORY_CARE_PROVIDER_SITE_OTHER): Payer: PPO | Admitting: Family

## 2019-05-22 VITALS — BP 130/64 | HR 60 | Ht 68.0 in | Wt 170.0 lb

## 2019-05-22 DIAGNOSIS — I5042 Chronic combined systolic (congestive) and diastolic (congestive) heart failure: Secondary | ICD-10-CM

## 2019-05-22 DIAGNOSIS — I420 Dilated cardiomyopathy: Secondary | ICD-10-CM

## 2019-05-22 DIAGNOSIS — I110 Hypertensive heart disease with heart failure: Secondary | ICD-10-CM | POA: Diagnosis not present

## 2019-05-22 DIAGNOSIS — I4819 Other persistent atrial fibrillation: Secondary | ICD-10-CM

## 2019-05-22 DIAGNOSIS — Z79899 Other long term (current) drug therapy: Secondary | ICD-10-CM

## 2019-05-22 DIAGNOSIS — Z7901 Long term (current) use of anticoagulants: Secondary | ICD-10-CM

## 2019-05-22 MED ORDER — AMIODARONE HCL 200 MG PO TABS: 200.0000 mg | ORAL_TABLET | Freq: Every day | ORAL | 1 refills | Status: DC

## 2019-05-22 MED ORDER — CARVEDILOL 3.125 MG PO TABS: 3.1250 mg | ORAL_TABLET | Freq: Two times a day (BID) | ORAL | 1 refills | Status: DC

## 2019-05-22 MED ORDER — FUROSEMIDE 20 MG PO TABS: ORAL_TABLET | ORAL | 1 refills | Status: DC

## 2019-05-22 NOTE — Progress Notes (Signed)
Office Visit    Patient Name: Clinton Joseph Date of Encounter: 05/22/2019  Primary Care Provider:  Olin Hauser, DO Primary Cardiologist:  Ida Rogue, MD Electrophysiologist:  None   Chief Complaint    Clinton Joseph is a 86 y.o. male with a hx of nonobstructive CAD, lymphoma in 2006 with chemotherapy x7 cycles, HTN, bradycardia, Mobitz type II with syncope s/p PPM, atrial fib/fluuteron Eliquis post briefly successful DCCV 04/2018 and spontaneous pharmacological conversion [id number], chronic systolic CHF, HLD presents today for follow up of systolic CHF.   Past Medical History    Past Medical History:  Diagnosis Date  . Anxiety state, unspecified   . Arthritis    a. Hands.  . Atrial flutter ([place])    a. Dx 03/2018-->Eliquis 5 BID started (CHA2DS2VASc = 5).  . Diffuse large B cell lymphoma ([place])    a. 2007 s/p chemo - followed by Dr. Jeb Levering.  . H/O echocardiogram    a. 11/2013 Echo: EF 55-60%, mildly dil LA, mild to mod Ao sclerosis.  . Hyperlipidemia   . Hypertensive heart disease   . Mobitz II    a. 11/2013 syncope-->s/p SJM Assurity DR model [id number] (ser # [id number]).  . Non-obstructive CAD    a. 10/2010 Cath: >M nl, LAD/LCX min irregs, RCA 40p, EF 60%.  . Tremor    a. right hand  . Urticaria, unspecified   . Ventricular tachycardia (HCC)    a. 2012->broke with amio, neg w/u.   Past Surgical History:  Procedure Laterality Date  . APPENDECTOMY  1948  . CARDIAC CATHETERIZATION  11/17/2010   [facility]- LAD min irregs, LCX min irregs, RCA 40p, EF 60%.   . CARDIOVERSION N/A 05/09/2018   Procedure: CARDIOVERSION (CATH LAB);  Surgeon: Minna Merritts, MD;  Location: [facility] ORS;  Service: Cardiovascular;  Laterality: N/A;  . CATARACT EXTRACTION, BILATERAL Bilateral ~ 2012  . INSERT / REPLACE / REMOVE PACEMAKER  12/05/2013  . PERMANENT PACEMAKER INSERTION N/A 12/05/2013   Procedure: PERMANENT PACEMAKER INSERTION;  Surgeon: Coralyn Mark, MD;  Location: [facility] CATH  LAB;  Service: Cardiovascular;  Laterality: N/A;    Allergies  Allergies  Allergen Reactions  . Keflex [Cephalexin]     Diarrhea   . Penicillins Hives and Rash    Did it involve swelling of the face/tongue/throat, SOB, or low BP? No Did it involve sudden or severe rash/hives, skin peeling, or any reaction on the inside of your mouth or nose? Unknown Did you need to seek medical attention at a hospital or doctor's office? Unknown When did it last happen?40 years ago If all above answers are "NO", may proceed with cephalosporin use.   . Tape Rash    History of Present Illness    Alfonse Robe is a 86 y.o. male with a hx of  hx of nonobstructive CAD, lymphoma in 2006 with chemotherapy x7 cycles, HTN, bradycardia, Mobitz type II with syncope s/p PPM, atrial fib/fluuteron Eliquis post briefly successful DCCV 04/2018 and spontaneous pharmacological conversion [id number], chronic systolic CHF, HLD. He was last seen 10/24/18 by Dr. Rockey Situ.  Mr. Be underwent cardiac cath 10/2017 with nonobstructive CAD. Noted to have Mobitz type II heart block with syncope and underwent successful PPM implantation 11/2013. Echo at that time with LVEF 55-60%, mildly dilated LA, mild-moderate aortic valve sclerosis w/o stenosis. Late 2019 noted elevated atrial rated by remote device monitoring consistent with atrial fib/flutter. Due to [facility] he was placed on Eliquis 5mg  BID. He had  difficulty with fluid retention in the setting of atrial fib/flutter and was started on Lasix 04/2018. Underwent successful cardioversion 05/09/18. Echo 05/26/18 with LVEF <20%, mildly dilated LV, global hypokinesis, mildly reduced RV systolic function with mildly enlarged RV cavity, mildly elevated RVSP of 56.6 mmHg, moderately dilated LA, mildly dilated RA< mod-severe MR, moderate to severe TR. Started on Coreg 6.25 and Amiodarone load. At visit with EP 05/31/18 he was noted to be AV paced. 06/15/18 he was started on Entresto.  Coreg dose was decreased 07/28/18 secondary to hypotension. Echo June 2020 with EF 45-50%.   Present today with his wife. Reports feeling well. Has been busy around the house with tasks such as laundry. No formal exercise regimen. Reports he follows a low sodium, heart healthy diet.   No chest pain, pressure, tightness. No LE edema, orthopnea, PND. No irregular heart rates. Checks BP at home and reports it is consistently <130/80. Endorses compliance with all of his medications.   EKGs/Labs/Other Studies Reviewed:   The following studies were reviewed today:  Echo 10/14/18  1. The left ventricle has mildly reduced systolic function, with an  ejection fraction of 45-50%. The cavity size was normal. There is  moderately increased left ventricular wall thickness. Left ventricular  diastolic function could not be evaluated. There   is abnormal septal motion consistent with RV pacemaker.   2. The right ventricle has normal systolc function. The cavity was mildly  enlarged. There is no increase in right ventricular wall thickness.   Remote Device Check 03/15/19 Normal device function. Battery status good. Lead measurements unchanged. Histograms appropriate.   EKG:  EKG is ordered today.  The ekg ordered today demonstrates Av sequential or dual cahmber PPM rate 60 bpm.   Recent Labs: 05/31/2018: TSH 2.890 07/05/2018: ALT 33; BUN 30; Creat 1.09; Hemoglobin 13.8; Platelets 168; Potassium 4.5; Sodium 139  Recent Lipid Panel    Component Value Date/Time   CHOL 196 07/05/2018 0825   TRIG 102 07/05/2018 0825   HDL 46 07/05/2018 0825   CHOLHDL 4.3 07/05/2018 0825   VLDL 30 06/12/2016 0001   LDLCALC 130 (H) 07/05/2018 0825    Home Medications   Current Meds  Medication Sig  . acetaminophen (TYLENOL) 650 MG CR tablet Take 1,300 mg by mouth daily as needed.   Marland Kitchen amiodarone (PACERONE) 200 MG tablet Take 1 tablet (200 mg total) by mouth daily.  . carvedilol (COREG) 3.125 MG tablet Take 1 tablet  (3.125 mg total) by mouth 2 (two) times daily.  . cetirizine (ZYRTEC) 10 MG tablet Take 10 mg by mouth daily as needed.   . cholecalciferol (VITAMIN D3) 25 MCG (1000 UT) tablet Take 1,000 Units by mouth 2 (two) times daily.  Marland Kitchen ELIQUIS 5 MG TABS tablet TAKE 1 TABLET BY MOUTH TWICE A DAY  . ENTRESTO 24-26 MG TAKE 1 TABLET BY MOUTH TWICE A DAY  . furosemide (LASIX) 20 MG tablet Take 1 tablet (20 mg) by mouth once every other day  . Multiple Vitamins-Minerals (MULTIVITAMIN WITH MINERALS) tablet Take 1 tablet by mouth daily. Centrum Silver  . Omega-3 Fatty Acids (FISH OIL) 1000 MG CAPS Take 1,000-2,000 mg by mouth See admin instructions. Take 2 capsules (2000 mg) by mouth in the morninig & 1 capsule (1000 mg) by mouth night.  . ondansetron (ZOFRAN ODT) 4 MG disintegrating tablet Take 1 tablet (4 mg total) by mouth every 8 (eight) hours as needed for nausea or vomiting.  . vitamin B-12 (CYANOCOBALAMIN) 1000 MCG tablet Take  1,000 mcg by mouth daily.  . [DISCONTINUED] amiodarone (PACERONE) 200 MG tablet Take 200 mg by mouth daily.  . [DISCONTINUED] carvedilol (COREG) 3.125 MG tablet Take 1 tablet (3.125 mg total) by mouth 2 (two) times daily.  . [DISCONTINUED] furosemide (LASIX) 20 MG tablet Take 1 tablet (20 mg) by mouth once every other day      Review of Systems   Review of Systems  Constitution: Negative for chills, fever and malaise/fatigue.  Cardiovascular: Negative for chest pain, dyspnea on exertion, leg swelling, near-syncope, orthopnea, palpitations and syncope.  Respiratory: Negative for cough, shortness of breath and wheezing.   Gastrointestinal: Negative for nausea and vomiting.  Neurological: Negative for dizziness, light-headedness and weakness.   All other systems reviewed and are otherwise negative except as noted above.  Physical Exam    VS:  BP 130/64 (BP Location: Left Arm, Patient Position: Sitting, Cuff Size: Normal)   Pulse 60   Ht 5\' 8"  (1.727 m)   Wt 170 lb (77.1 kg)    SpO2 96%   BMI 25.85 kg/m  , BMI Body mass index is 25.85 kg/m. GEN: Well nourished, well developed, in no acute distress. HEENT: normal. Neck: Supple, no JVD, carotid bruits, or masses. Cardiac: RRR, no murmurs, rubs, or gallops. No clubbing, cyanosis, edema.  Radials/PT 2+ and equal bilaterally.  Respiratory:  Respirations regular and unlabored, clear to auscultation bilaterally. GI: Soft, nontender, nondistended. MS: No deformity or atrophy. Skin: Warm and dry, no rash. Neuro:  Strength and sensation are intact. Psych: Normal affect.  Assessment & Plan    1. Nonobstructive CAD - Stable with no anginal symptoms.  As EF improved on GDMT for HFrEF will not pursue repeat ischemic evaluation at this time. Continue beta blocker. No aspirin secondary to chronic anticoagulation.   2. Persistent atrial fib/flutter - Recent remote device check with no atrial fib/flutter. CHADS2VASc of at least 5 (CHF, HTN, agex2, vascular disease) - remains anticoagulated on Eliquis with no bleeding complications. On amiodarone therapy - will check TSH, CMET today for monitoring.   3. HFrEF - Euvolemic and well compensated on exam. NYHA I.  Echo 10/14/18 with improvement in LVEF to 45-50%. Continue GDMT Coreg, Entresto, Lasix. No spironolactone secondary to hx of relative hyperkalemia. CBC, CMET today.   4. Syncope with Mobitz type II - s/p PPM. Follows with EP. No recurrent syncope. Will schedule for overdue follow up with Dr. Caryl Comes.   5. HTN - BP well controlled. Continue present antihypertensive regimen. Continue to monitor at home.   Disposition: Follow up in 6 month(s) with Dr. Rockey Situ or APP.    Loel Dubonnet, NP 05/22/2019, 4:57 PM

## 2019-05-22 NOTE — Patient Instructions (Addendum)
Medication Instructions:  No medication changes today.  A refill of your Lasix, Amiodarone, and Coreg were sent to your pharmacy .  *If you need a refill on your cardiac medications before your next appointment, please call your pharmacy*  Lab Work: Your physician recommends that you return for lab work today: CMET, CBC, TSH  If you have labs (blood work) drawn today and your tests are completely normal, you will receive your results only by: Marland Kitchen MyChart Message (if you have MyChart) OR . A paper copy in the mail If you have any lab test that is abnormal or we need to change your treatment, we will call you to review the results.  Testing/Procedures: You had an EKG today. It showed your pacemaker functioning appropriately.  Follow-Up: At [facility], you and your health needs are our priority.  As part of our continuing mission to provide you with exceptional heart care, we have created designated Provider Care Teams.  These Care Teams include your primary Cardiologist (physician) and Advanced Practice Providers (APPs -  Physician Assistants and Nurse Practitioners) who all work together to provide you with the care you need, when you need it.  Your next appointment:   6 month(s)  The format for your next appointment:   In Person  Provider:   Ida Rogue, MD  Other Instructions  We will also get you scheduled for follow up with Dr. Caryl Comes

## 2019-05-23 LAB — CBC
Hematocrit: 36.8 % — ABNORMAL LOW (ref 37.5–51.0)
Hemoglobin: 12.6 g/dL — ABNORMAL LOW (ref 13.0–17.7)
MCH: 32.1 pg (ref 26.6–33.0)
MCHC: 34.2 g/dL (ref 31.5–35.7)
MCV: 94 fL (ref 79–97)
Platelets: 131 10*3/uL — ABNORMAL LOW (ref 150–450)
RBC: 3.92 x10E6/uL — ABNORMAL LOW (ref 4.14–5.80)
RDW: 12.7 % (ref 11.6–15.4)
WBC: 4.8 10*3/uL (ref 3.4–10.8)

## 2019-05-23 LAB — COMPREHENSIVE METABOLIC PANEL
ALT: 37 IU/L (ref 0–44)
AST: 54 IU/L — ABNORMAL HIGH (ref 0–40)
Albumin/Globulin Ratio: 1.6 (ref 1.2–2.2)
Albumin: 4.1 g/dL (ref 3.6–4.6)
Alkaline Phosphatase: 87 IU/L (ref 39–117)
BUN/Creatinine Ratio: 19 (ref 10–24)
BUN: 25 mg/dL (ref 8–27)
Bilirubin Total: 0.5 mg/dL (ref 0.0–1.2)
CO2: 24 mmol/L (ref 20–29)
Calcium: 10 mg/dL (ref 8.6–10.2)
Chloride: 102 mmol/L (ref 96–106)
Creatinine, Ser: 1.33 mg/dL — ABNORMAL HIGH (ref 0.76–1.27)
GFR calc Af Amer: 54 mL/min/{1.73_m2} — ABNORMAL LOW (ref >59)
GFR calc non Af Amer: 47 mL/min/{1.73_m2} — ABNORMAL LOW (ref >59)
Globulin, Total: 2.6 g/dL (ref 1.5–4.5)
Glucose: 130 mg/dL — ABNORMAL HIGH (ref 65–99)
Potassium: 4.7 mmol/L (ref 3.5–5.2)
Sodium: 141 mmol/L (ref 134–144)
Total Protein: 6.7 g/dL (ref 6.0–8.5)

## 2019-05-23 LAB — TSH: TSH: 1.84 u[IU]/mL (ref 0.450–4.500)

## 2019-05-24 ENCOUNTER — Telehealth: Payer: Self-pay | Admitting: *Deleted

## 2019-05-24 DIAGNOSIS — D696 Thrombocytopenia, unspecified: Secondary | ICD-10-CM

## 2019-05-24 DIAGNOSIS — R7401 Elevation of levels of liver transaminase levels: Secondary | ICD-10-CM

## 2019-05-24 NOTE — Telephone Encounter (Signed)
Results called to pt and wife. Wife verbalized understanding and is aware to go to the [facility] in 1 month for lab work.  Lab orders entered.

## 2019-05-24 NOTE — Telephone Encounter (Signed)
-----   Message from Clinton Dubonnet, NP sent at 05/23/2019  9:02 AM EST ----- Kidney function stable. Electrolytes normal. Thyroid function normal. One of two liver enzymes (AST) mildly elevated, will continue to monitor but no changes necessary at this time. Blood counts show stable hemoglobin and mildly decreased platelets. He has had decreased platelets back 06/2017 but they have since improved. Would recommend repeat CBC/hepatic function panel in 1 month for monitoring.

## 2019-06-19 ENCOUNTER — Ambulatory Visit (INDEPENDENT_AMBULATORY_CARE_PROVIDER_SITE_OTHER): Payer: PPO | Admitting: *Deleted

## 2019-06-19 DIAGNOSIS — I442 Atrioventricular block, complete: Secondary | ICD-10-CM

## 2019-06-19 LAB — CUP PACEART REMOTE DEVICE CHECK
Battery Remaining Longevity: 109 mo
Battery Remaining Percentage: 95.5 %
Battery Voltage: 2.99 V
Brady Statistic AP VP Percent: 78 %
Brady Statistic AP VS Percent: 1 %
Brady Statistic AS VP Percent: 22 %
Brady Statistic AS VS Percent: 1 %
Brady Statistic RA Percent Paced: 78 %
Brady Statistic RV Percent Paced: 99 %
Date Time Interrogation Session: 20210301020023
Implantable Lead Implant Date: 20150818
Implantable Lead Implant Date: 20150818
Implantable Lead Location: 753859
Implantable Lead Location: 753860
Implantable Lead Model: 1948
Implantable Pulse Generator Implant Date: 20150818
Lead Channel Impedance Value: 390 Ohm
Lead Channel Impedance Value: 790 Ohm
Lead Channel Pacing Threshold Amplitude: 0.5 V
Lead Channel Pacing Threshold Amplitude: 0.5 V
Lead Channel Pacing Threshold Pulse Width: 0.5 ms
Lead Channel Pacing Threshold Pulse Width: 0.5 ms
Lead Channel Sensing Intrinsic Amplitude: 10 mV
Lead Channel Sensing Intrinsic Amplitude: 5 mV
Lead Channel Setting Pacing Amplitude: 2 V
Lead Channel Setting Pacing Amplitude: 2.5 V
Lead Channel Setting Pacing Pulse Width: 0.5 ms
Lead Channel Setting Sensing Sensitivity: 4 mV
Pulse Gen Model: 2240
Pulse Gen Serial Number: 7664739

## 2019-06-19 NOTE — Progress Notes (Signed)
PPM Remote  

## 2019-06-21 ENCOUNTER — Other Ambulatory Visit
Admission: RE | Admit: 2019-06-21 | Discharge: 2019-06-21 | Disposition: A | Discharge status: Home / Self Care | Payer: PPO | Source: Ambulatory Visit | Attending: Family | Admitting: Family

## 2019-06-21 DIAGNOSIS — D696 Thrombocytopenia, unspecified: Secondary | ICD-10-CM | POA: Insufficient documentation

## 2019-06-21 DIAGNOSIS — R7401 Elevation of levels of liver transaminase levels: Secondary | ICD-10-CM | POA: Diagnosis not present

## 2019-06-21 LAB — CBC WITH DIFFERENTIAL/PLATELET
Abs Immature Granulocytes: 0.01 10*3/uL (ref 0.00–0.07)
Basophils Absolute: 0.1 10*3/uL (ref 0.0–0.1)
Basophils Relative: 1 %
Eosinophils Absolute: 0.2 10*3/uL (ref 0.0–0.5)
Eosinophils Relative: 4 %
HCT: 35 % — ABNORMAL LOW (ref 39.0–52.0)
Hemoglobin: 11.6 g/dL — ABNORMAL LOW (ref 13.0–17.0)
Immature Granulocytes: 0 %
Lymphocytes Relative: 22 %
Lymphs Abs: 0.9 10*3/uL (ref 0.7–4.0)
MCH: 32.4 pg (ref 26.0–34.0)
MCHC: 33.1 g/dL (ref 30.0–36.0)
MCV: 97.8 fL (ref 80.0–100.0)
Monocytes Absolute: 0.5 10*3/uL (ref 0.1–1.0)
Monocytes Relative: 12 %
Neutro Abs: 2.6 10*3/uL (ref 1.7–7.7)
Neutrophils Relative %: 61 %
Platelets: 115 10*3/uL — ABNORMAL LOW (ref 150–400)
RBC: 3.58 MIL/uL — ABNORMAL LOW (ref 4.22–5.81)
RDW: 13 % (ref 11.5–15.5)
WBC: 4.3 10*3/uL (ref 4.0–10.5)
nRBC: 0 % (ref 0.0–0.2)

## 2019-06-21 LAB — HEPATIC FUNCTION PANEL
ALT: 40 U/L (ref 0–44)
AST: 48 U/L — ABNORMAL HIGH (ref 15–41)
Albumin: 3.7 g/dL (ref 3.5–5.0)
Alkaline Phosphatase: 66 U/L (ref 38–126)
Bilirubin, Direct: 0.1 mg/dL (ref 0.0–0.2)
Indirect Bilirubin: 0.6 mg/dL (ref 0.3–0.9)
Total Bilirubin: 0.7 mg/dL (ref 0.3–1.2)
Total Protein: 6.9 g/dL (ref 6.5–8.1)

## 2019-06-22 ENCOUNTER — Telehealth: Payer: Self-pay | Admitting: Family Medicine

## 2019-06-22 ENCOUNTER — Telehealth: Payer: Self-pay

## 2019-06-22 ENCOUNTER — Other Ambulatory Visit: Payer: Self-pay | Admitting: Cardiovascular Disease

## 2019-06-22 NOTE — Telephone Encounter (Signed)
Call to patient to review lab results.   Pt verbalized understanding and reported that he has been in touch with PCP to follow up on anemia.   No further questions or orders at this time.   Advised pt to call for any further questions or concerns.

## 2019-06-22 NOTE — Telephone Encounter (Signed)
Refill request

## 2019-06-22 NOTE — Telephone Encounter (Signed)
Please call patient to offer follow-up about these abnormal lab results.  He had blood done by Cardiology recently. CBC showed low platelet number. They asked [place] to follow-up with him.  We may need to repeat the CBC again or just refer him back to his Oncologist.  He may follow-up with me within next 1-2 weeks, either in person or virtual and then we can consider refer him back to Oncology   Copied message from Laurann Montana NP  His AST improved, but his platelet count has worsened to 115. He has had low platelet counts 3 years ago, but they have been better more recently. I recommended he follow up with you and/or Dr. Jeb Levering (previously treated his B cell lymphoma).   Nobie Putnam, [facility] 06/22/2019, 12:50 PM

## 2019-06-22 NOTE — Telephone Encounter (Signed)
-----   Message from Loel Dubonnet, NP sent at 06/21/2019  1:12 PM EST ----- Liver function improved compared to previous. Blood counts show mild decline in hemoglobin, red blood cells, and platelets compared to previous.  Recommend he follow up with his PCP and/or Dr. Oliva Bustard (his oncologist) regarding these results. No medication changes. Recommend dietary sources of iron such as green leafy vegetables, legumes.  I have forwarded results to his PCP

## 2019-06-22 NOTE — Telephone Encounter (Signed)
Patient informed apt scheduled.

## 2019-06-22 NOTE — Telephone Encounter (Signed)
Prescription refill request for Eliquis received.  Last office visit: Clinton Joseph 05/22/2019 Scr: 1.33, 05/22/2019 Age: 86 y.o. Weight: 77.1 kg   Prescription refill sent.

## 2019-06-22 NOTE — Telephone Encounter (Addendum)
Prior Authorization for Entresto 24/26 mg faxed to [phone] to [employer].  Awaiting approval.

## 2019-06-23 ENCOUNTER — Telehealth: Payer: Self-pay

## 2019-06-23 NOTE — Telephone Encounter (Signed)
Prior Authorization for Entresto 24-26 mg approved through 06/21/2020.  [facility] [place] notified of authorization as well as the patient.

## 2019-07-03 IMAGING — DX DG KNEE COMPLETE 4+V*R*
4 series · 4 of 4 positions shown · non-contrast
Comparison: None.

CLINICAL DATA: Pain for 3 weeks

EXAM:
RIGHT KNEE - COMPLETE 4+ VIEW

[knee ap]
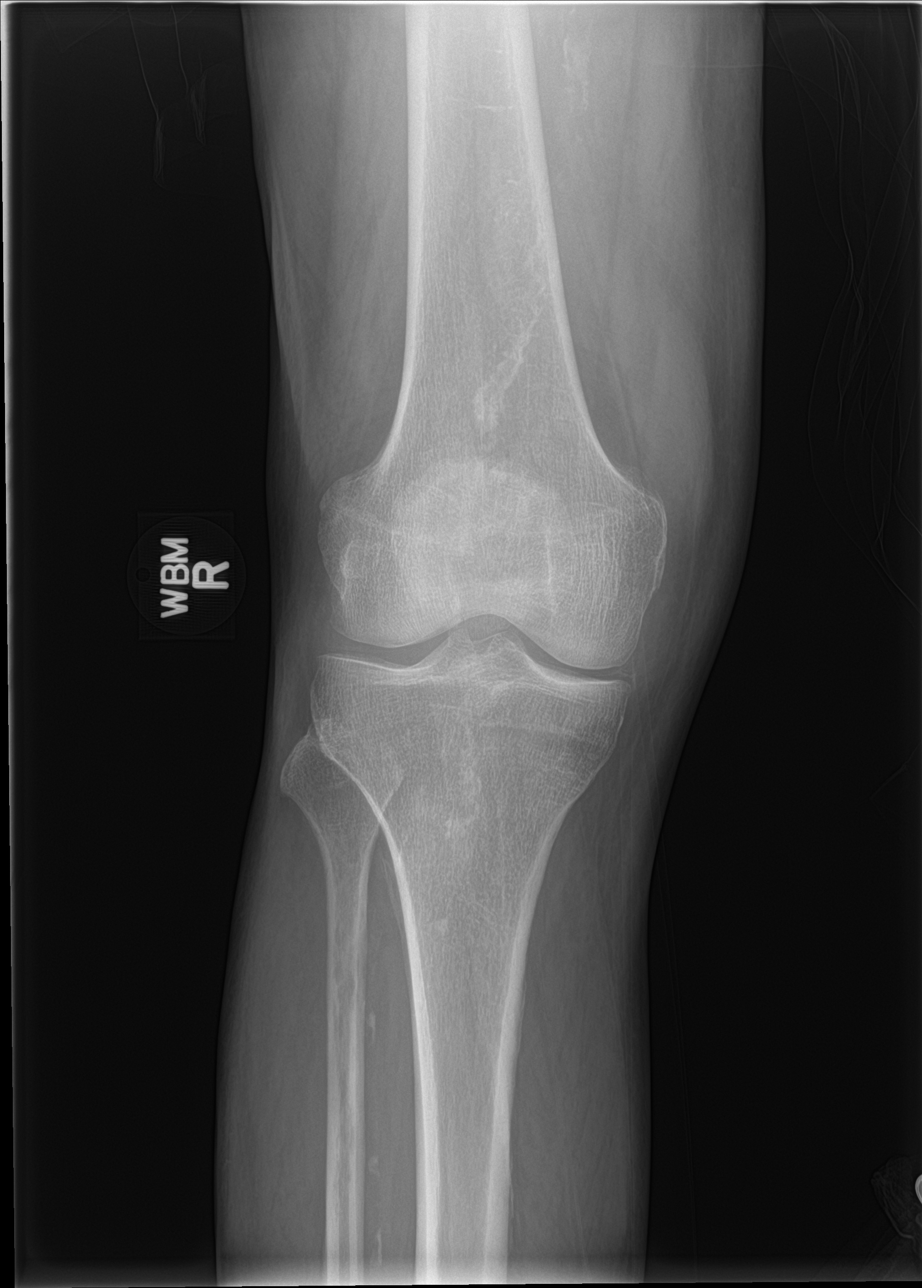

[knee obl (1 of 2)]
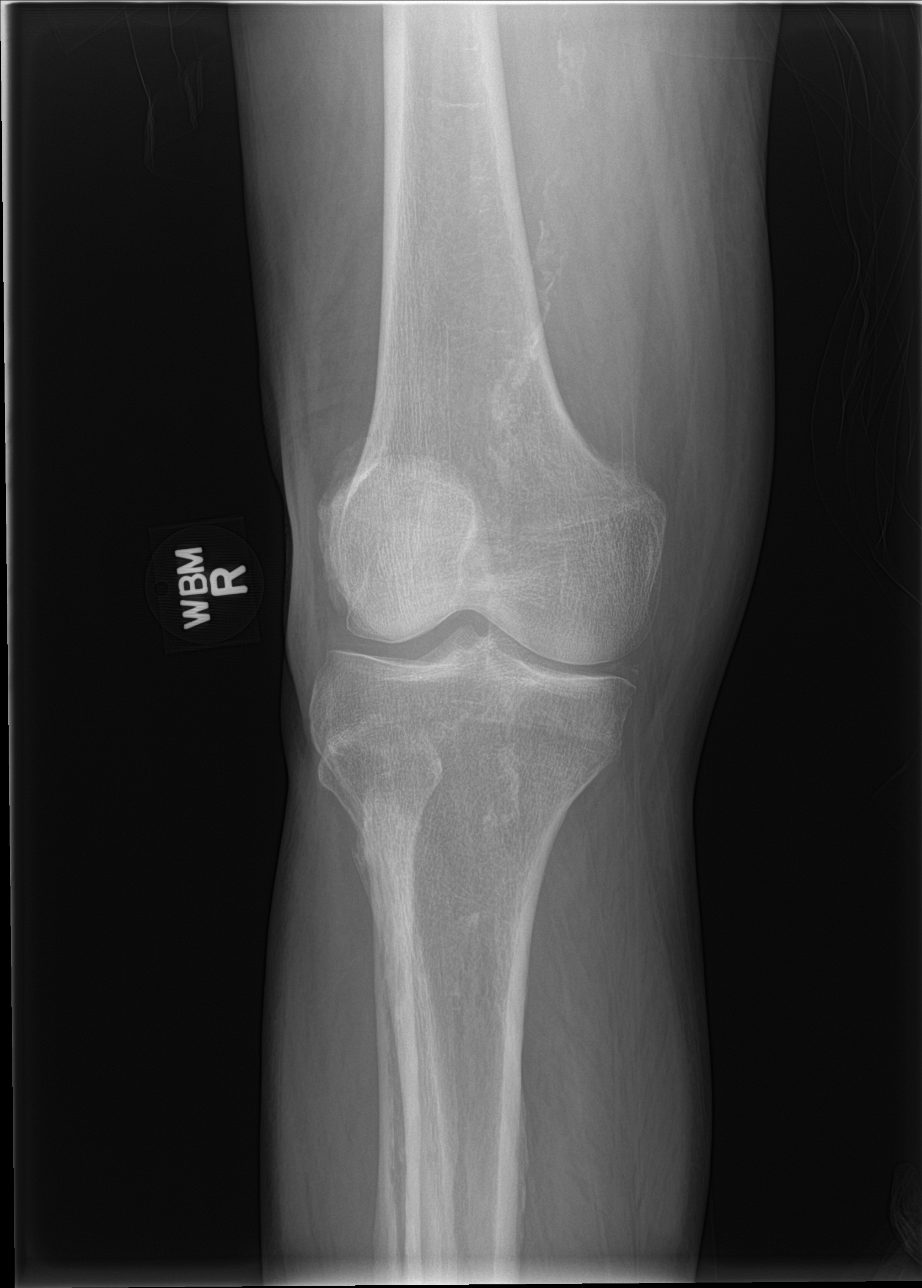

[knee obl (2 of 2)]
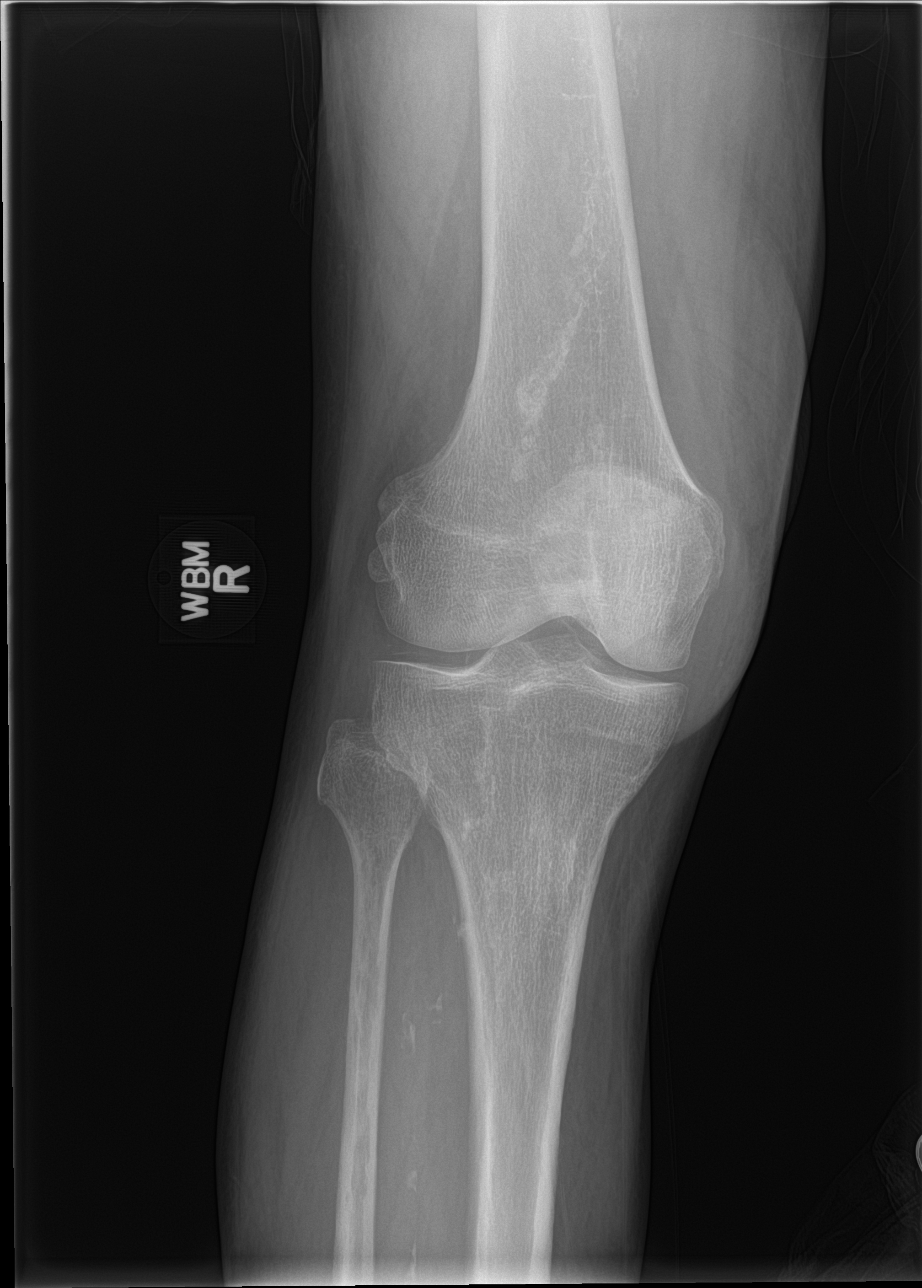

[knee lat]
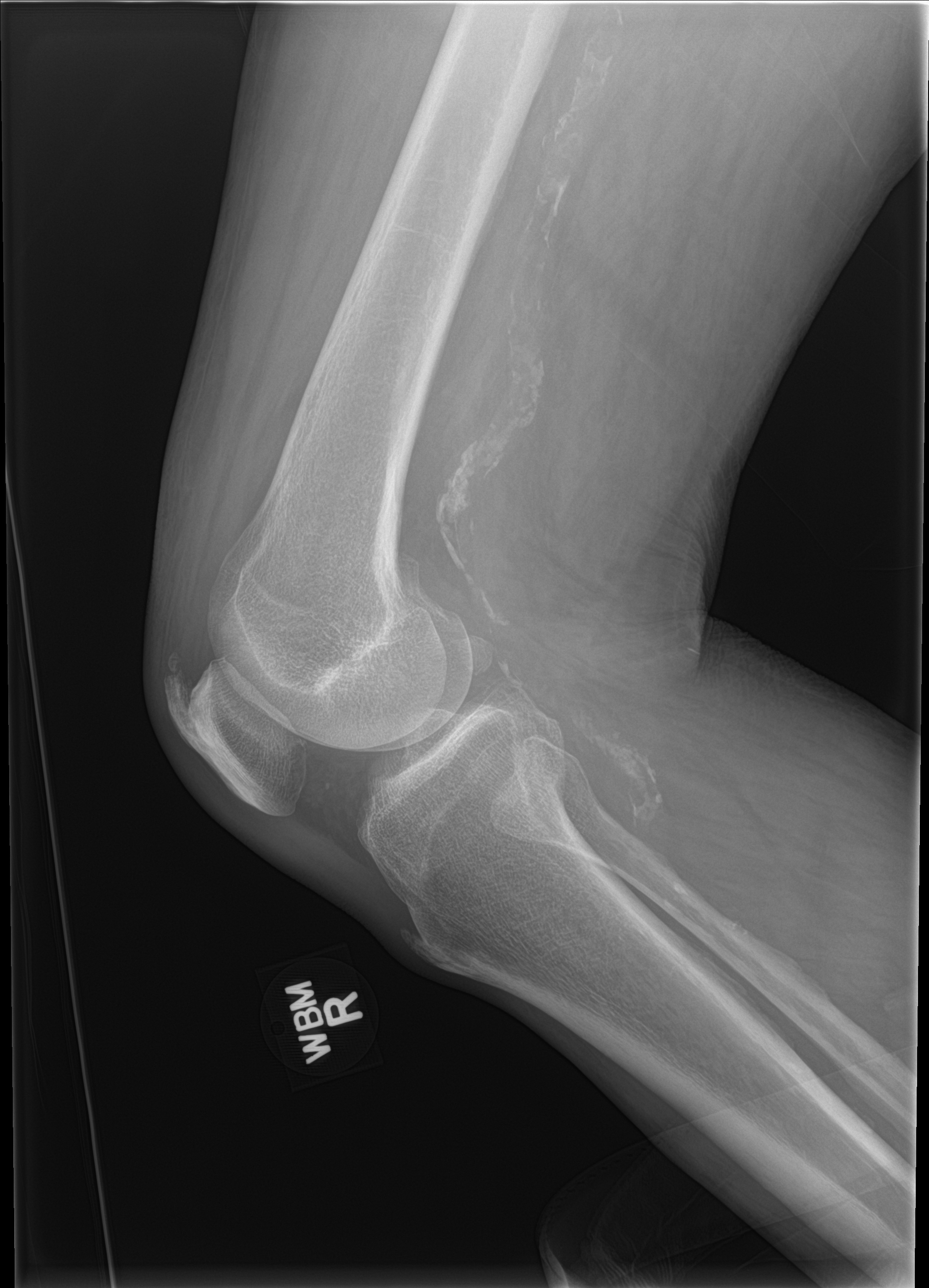

[4 of 4 positions shown; findings below may reference images not displayed]

FINDINGS: Frontal, lateral, and bilateral oblique views were obtained. There
is no evident fracture or dislocation. There is no appreciable joint
effusion. There is narrowing medially and in the patellofemoral
joint regions. There is chondrocalcinosis. There is a prominent spur
along the anterior superior patella. There is extensive arterial
vascular calcification the distal superficial femoral, popliteal,
and proximal trifurcation arterial vessels.
IMPRESSION: Osteoarthritic change, noted primarily medially and in
patellofemoral joint regions. There is chondrocalcinosis, a finding
that may be indicative of osteoarthritic change or calcium
pyrophosphate deposition disease. No fracture or joint effusion.

Prominent spur along the anterior superior patella most likely is
due to distal quadriceps tendinosis.

Extensive arterial vascular calcifications/atherosclerosis.

## 2019-07-04 ENCOUNTER — Other Ambulatory Visit: Payer: Self-pay | Admitting: Cardiovascular Disease

## 2019-07-04 MED ORDER — APIXABAN 5 MG PO TABS: 5.0000 mg | ORAL_TABLET | Freq: Two times a day (BID) | ORAL | 1 refills | Status: DC

## 2019-07-04 NOTE — Telephone Encounter (Signed)
Refill request for Eliquis

## 2019-07-04 NOTE — Telephone Encounter (Signed)
*  STAT* If patient is at the pharmacy, call can be transferred to refill team.   1. Which medications need to be refilled? (please list name of each medication and dose if known) Eliquis 5 mg bid  2. Which pharmacy/location (including street and city if local pharmacy) is medication to [facility] sent to[facility] [place]  3. Do they need a 30 day or 90 day supply? 90 day

## 2019-07-05 ENCOUNTER — Encounter: Payer: Self-pay | Admitting: Family Medicine

## 2019-07-05 ENCOUNTER — Ambulatory Visit (INDEPENDENT_AMBULATORY_CARE_PROVIDER_SITE_OTHER): Payer: PPO | Admitting: Family Medicine

## 2019-07-05 ENCOUNTER — Other Ambulatory Visit: Payer: Self-pay

## 2019-07-05 ENCOUNTER — Other Ambulatory Visit: Payer: Self-pay | Admitting: Family Medicine

## 2019-07-05 VITALS — BP 144/52 | HR 59 | Temp 97.5°F | Resp 16 | Ht 68.0 in | Wt 167.0 lb

## 2019-07-05 DIAGNOSIS — I4819 Other persistent atrial fibrillation: Secondary | ICD-10-CM

## 2019-07-05 DIAGNOSIS — R7303 Prediabetes: Secondary | ICD-10-CM | POA: Diagnosis not present

## 2019-07-05 DIAGNOSIS — I10 Essential (primary) hypertension: Secondary | ICD-10-CM | POA: Diagnosis not present

## 2019-07-05 DIAGNOSIS — C8590 Non-Hodgkin lymphoma, unspecified, unspecified site: Secondary | ICD-10-CM | POA: Diagnosis not present

## 2019-07-05 DIAGNOSIS — I2510 Atherosclerotic heart disease of native coronary artery without angina pectoris: Secondary | ICD-10-CM

## 2019-07-05 DIAGNOSIS — F3342 Major depressive disorder, recurrent, in full remission: Secondary | ICD-10-CM

## 2019-07-05 DIAGNOSIS — I442 Atrioventricular block, complete: Secondary | ICD-10-CM | POA: Diagnosis not present

## 2019-07-05 DIAGNOSIS — D696 Thrombocytopenia, unspecified: Secondary | ICD-10-CM | POA: Diagnosis not present

## 2019-07-05 DIAGNOSIS — Z0000 Encounter for general adult medical examination without abnormal findings: Secondary | ICD-10-CM

## 2019-07-05 DIAGNOSIS — N1831 Chronic kidney disease, stage 3a: Secondary | ICD-10-CM

## 2019-07-05 DIAGNOSIS — E782 Mixed hyperlipidemia: Secondary | ICD-10-CM

## 2019-07-05 DIAGNOSIS — R351 Nocturia: Secondary | ICD-10-CM

## 2019-07-05 LAB — POCT GLYCOSYLATED HEMOGLOBIN (HGB A1C): Hemoglobin A1C: 5.7 % — AB (ref 4.0–5.6)

## 2019-07-05 NOTE — Assessment & Plan Note (Signed)
S/p pacemaker Monitored by [facility]

## 2019-07-05 NOTE — Assessment & Plan Note (Signed)
Stable without evidence of recurrence Previously followed by [facility]

## 2019-07-05 NOTE — Assessment & Plan Note (Signed)
Resolved Full remission Off meds

## 2019-07-05 NOTE — Progress Notes (Addendum)
Subjective:    Patient ID: Clinton Joseph, male    DOB: May 05, 1927, 86 y.o.   MRN: [id number]  Clinton Joseph is a 86 y.o. male presenting on 07/05/2019 for Hypertension   HPI   CHRONIC HTNwith CKD-III He is doing well. No concerns. Recent readings outside office were good. He does not check BP at home. Current Meds - Losartan25mg  daily Reports good compliance, took meds today. Tolerating well, w/o complaints. Denies CP, dyspnea, HA, edema, dizziness / lightheadedness  Pre-Diabetes Last lab A1c today showed A1c 5.7 CBGs:none Meds:None Currently on ARB Lifestyle: - Diet (tries to adhere to DM diet, but still eating some occasional sweets, increasing water) - Exercise (walking more, using cane) Denies hypoglycemia, polyuria, visual changes, numbness or tingling.  History of CAD / Complete heart block s/p pacer / Atrial Fibrillation / Pacemaker Currently doing well no issues On beta blocker, on anticoagulation w/ Eliquis, on amiodarone. Next apt 07/19/19 Cardiologist  Lymphoma in remission Previously followed by Oncology  Thrombocytopenia Last lab from Cardiology showed reduced PLT from 131 down to 115. They asked him to review this with me. He has not endorsed any bleeding abnormality or bruising.  Depression in remission Resolved issue. No new concerns Off meds  Health Maintenance: UTD COVID19 vaccine  Depression screen [facility] 2/9 02/28/2019 01/10/2019 10/04/2018  Decreased Interest 0 0 0  Down, Depressed, Hopeless 0 0 0  PHQ - 2 Score 0 0 0  Altered sleeping - 0 0  Tired, decreased energy - 0 1  Change in appetite - 0 0  Feeling bad or failure about yourself  - 0 0  Trouble concentrating - 0 0  Moving slowly or fidgety/restless - 0 0  Suicidal thoughts - 0 0  PHQ-9 Score - 0 1  Difficult doing work/chores - Not difficult at all Not difficult at all    Social History   Tobacco Use  . Smoking status: Never Smoker  . Smokeless tobacco: Never  Used  Substance Use Topics  . Alcohol use: Yes    Alcohol/week: 2.0 standard drinks    Types: 2 Glasses of wine per week  . Drug use: No    Review of Systems Per HPI unless specifically indicated above     Objective:    BP (!) 144/52   Pulse (!) 59   Temp (!) 97.5 F (36.4 C) (Temporal)   Resp 16   Ht 5\' 8"  (1.727 m)   Wt 167 lb (75.8 kg)   BMI 25.39 kg/m   Wt Readings from Last 3 Encounters:  07/05/19 167 lb (75.8 kg)  05/22/19 170 lb (77.1 kg)  02/28/19 170 lb 6.4 oz (77.3 kg)    Physical Exam Vitals and nursing note reviewed.  Constitutional:      General: He is not in acute distress.    Appearance: He is well-developed. He is not diaphoretic.     Comments: Well-appearing, comfortable, cooperative  HENT:     Head: Normocephalic and atraumatic.  Eyes:     General:        Right eye: No discharge.        Left eye: No discharge.     Conjunctiva/sclera: Conjunctivae normal.  Neck:     Thyroid: No thyromegaly.  Cardiovascular:     Rate and Rhythm: Regular rhythm. Bradycardia present.     Heart sounds: Normal heart sounds. No murmur.     Comments: No irregular rhythm today Pulmonary:     Effort: Pulmonary effort is  normal. No respiratory distress.     Breath sounds: Normal breath sounds. No wheezing or rales.  Musculoskeletal:        General: Normal range of motion.     Cervical back: Normal range of motion and neck supple.  Lymphadenopathy:     Cervical: No cervical adenopathy.  Skin:    General: Skin is warm and dry.     Findings: No erythema or rash.  Neurological:     Mental Status: He is alert and oriented to person, place, and time.  Psychiatric:        Behavior: Behavior normal.     Comments: Well groomed, good eye contact, normal speech and thoughts       Recent Labs    07/05/19 1146  HGBA1C 5.7*   CBC Latest Ref Rng & Units 06/21/2019 05/22/2019 07/05/2018  WBC 4.0 - 10.5 K/uL 4.3 4.8 6.3  Hemoglobin 13.0 - 17.0 g/dL 11.6(L) 12.6(L) 13.8   Hematocrit 39.0 - 52.0 % 35.0(L) 36.8(L) 42.9  Platelets 150 - 400 K/uL 115(L) 131(L) 168     Results for orders placed or performed in visit on 07/05/19  POCT HgB A1C  Result Value Ref Range   Hemoglobin A1C 5.7 (A) 4.0 - 5.6 %      Assessment & Plan:   Problem List Items Addressed This Visit    Thrombocytopenia ([place])    Recent low PLT trend 131 > 115 Asymptomatic Concern with history of lymphoma, but no other cell line abnormality No longer w/ Oncology Cardiology was checking CBC, now requested follow-up on this issue Spoke with patient's wife after visit, as patient did not mention this one during our office visit.  Placed order future CBC can be drawn anytime this week or next week, we can call them after lab results. If still lower PLT < 100 or not improved may offer return to Oncology we can refer, otherwise if stable to improved we can monitor for longer, as long as he is asymptomatic as well.  Follow-up as planned.      Relevant Orders   CBC with Differential/Platelet   Pre-diabetes - Primary    Improved PreDM A1c, stable at 5.7 Complicated by CKD-III, CAD  Plan: 1. Remain off medications 2. Encouraged improved exercise with stationary bike as planned, continue to reduce carbs in diet and sweets 3. Follow-up to monitor in 6 months for yearly with labs      Relevant Orders   POCT HgB A1C (Completed)   Persistent atrial fibrillation (HCC)    Currently not in atrial fibrillation Rhythm control on amio Rate control and on anticoagulation      Major depressive disorder, recurrent episode, in full remission ([place])    Resolved Full remission Off meds      Lymphoma in remission ([place])    Stable without evidence of recurrence Previously followed by [facility]      Essential hypertension    Controlled HTN - Home BP readings none Complication with CKD-III, CAD    Plan:  1. Continue current BP regimen - Losartan 25mg  daily 2. Encourage improved lifestyle -  low sodium diet, regular exercise 3. May monitor BP outside office 4. Follow-up 6 months      Coronary artery disease involving native heart without angina pectoris    Stable without angina Followed by [facility] Cardiology On med management      Complete heart block [facility])    S/p pacemaker Monitored by [facility] Cardiology  No orders of the defined types were placed in this encounter.  Orders Placed This Encounter  Procedures  . CBC with Differential/Platelet    Standing Status:   Future    Standing Expiration Date:   10/05/2019  . POCT HgB A1C     Follow up plan: Return in about 6 months (around 01/05/2020) for Annual Physical.  Future labs ordered for 01/03/20  Nobie Putnam, DO [facility] 07/05/2019, 11:45 AM

## 2019-07-05 NOTE — Addendum Note (Signed)
Addended by: Olin Hauser on: 07/05/2019 12:53 PM   Modules accepted: Orders

## 2019-07-05 NOTE — Assessment & Plan Note (Signed)
Improved PreDM A1c, stable at 5.7 Complicated by CKD-III, CAD  Plan: 1. Remain off medications 2. Encouraged improved exercise with stationary bike as planned, continue to reduce carbs in diet and sweets 3. Follow-up to monitor in 6 months for yearly with labs

## 2019-07-05 NOTE — Patient Instructions (Addendum)
Thank you for coming to the office today.  Keep up the good work overall.  Recent Labs    07/05/19 1146  HGBA1C 5.7*    Excellent A1c sugar reading. Improved from last time >6 Keep on diet as you are Stay active with walking Use cane for safety  May resume going to [facility] when ready, should still wear mask if unsure if majority are vaccinated or not.  DUE for FASTING BLOOD WORK (no food or drink after midnight before the lab appointment, only water or coffee without cream/sugar on the morning of)  SCHEDULE "Lab Only" visit in the morning at the clinic for lab draw in 6 MONTHS   - Make sure Lab Only appointment is at about 1 week before your next appointment, so that results will be available  For Lab Results, once available within 2-3 days of blood draw, you can can log in to MyChart online to view your results and a brief explanation. Also, we can discuss results at next follow-up visit.   Please schedule a Follow-up Appointment to: Return in about 6 months (around 01/05/2020) for Annual Physical.  If you have any other questions or concerns, please feel free to call the office or send a message through [place]. You may also schedule an earlier appointment if necessary.  Additionally, you may be receiving a survey about your experience at our office within a few days to 1 week by e-mail or mail. We value your feedback.  Nobie Putnam, DO [place]

## 2019-07-05 NOTE — Assessment & Plan Note (Signed)
Recent low PLT trend 131 > 115 Asymptomatic Concern with history of lymphoma, but no other cell line abnormality No longer w/ Oncology Cardiology was checking CBC, now requested follow-up on this issue Spoke with patient's wife after visit, as patient did not mention this one during our office visit.  Placed order future CBC can be drawn anytime this week or next week, we can call them after lab results. If still lower PLT < 100 or not improved may offer return to Oncology we can refer, otherwise if stable to improved we can monitor for longer, as long as he is asymptomatic as well.  Follow-up as planned.

## 2019-07-05 NOTE — Addendum Note (Signed)
Addended by: Olin Hauser on: 07/05/2019 01:41 PM   Modules accepted: Orders

## 2019-07-05 NOTE — Assessment & Plan Note (Signed)
Stable without angina Followed by [facility] Cardiology On med management

## 2019-07-05 NOTE — Assessment & Plan Note (Signed)
Currently not in atrial fibrillation Rhythm control on amio Rate control and on anticoagulation

## 2019-07-05 NOTE — Assessment & Plan Note (Signed)
Controlled HTN - Home BP readings none Complication with CKD-III, CAD    Plan:  1. Continue current BP regimen - Losartan 25mg  daily 2. Encourage improved lifestyle - low sodium diet, regular exercise 3. May monitor BP outside office 4. Follow-up 6 months

## 2019-07-12 ENCOUNTER — Other Ambulatory Visit: Payer: Self-pay

## 2019-07-12 ENCOUNTER — Other Ambulatory Visit: Payer: PPO

## 2019-07-12 DIAGNOSIS — N1831 Chronic kidney disease, stage 3a: Secondary | ICD-10-CM

## 2019-07-12 DIAGNOSIS — D696 Thrombocytopenia, unspecified: Secondary | ICD-10-CM | POA: Diagnosis not present

## 2019-07-12 DIAGNOSIS — I10 Essential (primary) hypertension: Secondary | ICD-10-CM | POA: Diagnosis not present

## 2019-07-12 DIAGNOSIS — R7303 Prediabetes: Secondary | ICD-10-CM

## 2019-07-12 DIAGNOSIS — E782 Mixed hyperlipidemia: Secondary | ICD-10-CM

## 2019-07-12 DIAGNOSIS — R351 Nocturia: Secondary | ICD-10-CM

## 2019-07-12 DIAGNOSIS — I4819 Other persistent atrial fibrillation: Secondary | ICD-10-CM | POA: Diagnosis not present

## 2019-07-12 DIAGNOSIS — C8590 Non-Hodgkin lymphoma, unspecified, unspecified site: Secondary | ICD-10-CM

## 2019-07-12 DIAGNOSIS — Z0000 Encounter for general adult medical examination without abnormal findings: Secondary | ICD-10-CM

## 2019-07-13 LAB — CBC WITH DIFFERENTIAL/PLATELET
Absolute Monocytes: 386 cells/uL (ref 200–950)
Basophils Absolute: 59 cells/uL (ref 0–200)
Basophils Relative: 1.5 %
Eosinophils Absolute: 160 cells/uL (ref 15–500)
Eosinophils Relative: 4.1 %
HCT: 34.3 % — ABNORMAL LOW (ref 38.5–50.0)
Hemoglobin: 11.5 g/dL — ABNORMAL LOW (ref 13.2–17.1)
Lymphs Abs: 956 cells/uL (ref 850–3900)
MCH: 32.5 pg (ref 27.0–33.0)
MCHC: 33.5 g/dL (ref 32.0–36.0)
MCV: 96.9 fL (ref 80.0–100.0)
MPV: 10.9 fL (ref 7.5–12.5)
Monocytes Relative: 9.9 %
Neutro Abs: 2340 cells/uL (ref 1500–7800)
Neutrophils Relative %: 60 %
Platelets: 138 10*3/uL — ABNORMAL LOW (ref 140–400)
RBC: 3.54 10*6/uL — ABNORMAL LOW (ref 4.20–5.80)
RDW: 12.6 % (ref 11.0–15.0)
Total Lymphocyte: 24.5 %
WBC: 3.9 10*3/uL (ref 3.8–10.8)

## 2019-07-13 LAB — PSA: PSA: 0.1 ng/mL (ref <=4.0)

## 2019-07-13 LAB — COMPLETE METABOLIC PANEL WITH GFR
AG Ratio: 1.8 (calc) (ref 1.0–2.5)
ALT: 35 U/L (ref 9–46)
AST: 45 U/L — ABNORMAL HIGH (ref 10–35)
Albumin: 4.1 g/dL (ref 3.6–5.1)
Alkaline phosphatase (APISO): 69 U/L (ref 35–144)
BUN/Creatinine Ratio: 20 (calc) (ref 6–22)
BUN: 27 mg/dL — ABNORMAL HIGH (ref 7–25)
CO2: 30 mmol/L (ref 20–32)
Calcium: 9.7 mg/dL (ref 8.6–10.3)
Chloride: 105 mmol/L (ref 98–110)
Creat: 1.38 mg/dL — ABNORMAL HIGH (ref 0.70–1.11)
GFR, Est African American: 52 mL/min/{1.73_m2} — ABNORMAL LOW (ref >=60)
GFR, Est Non African American: 45 mL/min/{1.73_m2} — ABNORMAL LOW (ref >=60)
Globulin: 2.3 g/dL (calc) (ref 1.9–3.7)
Glucose, Bld: 107 mg/dL — ABNORMAL HIGH (ref 65–99)
Potassium: 4.8 mmol/L (ref 3.5–5.3)
Sodium: 141 mmol/L (ref 135–146)
Total Bilirubin: 0.6 mg/dL (ref 0.2–1.2)
Total Protein: 6.4 g/dL (ref 6.1–8.1)

## 2019-07-13 LAB — LIPID PANEL
Cholesterol: 175 mg/dL (ref <200)
HDL: 28 mg/dL — ABNORMAL LOW (ref >=40)
LDL Cholesterol (Calc): 118 mg/dL (calc) — ABNORMAL HIGH
Non-HDL Cholesterol (Calc): 147 mg/dL (calc) — ABNORMAL HIGH (ref <130)
Total CHOL/HDL Ratio: 6.3 (calc) — ABNORMAL HIGH (ref <5.0)
Triglycerides: 169 mg/dL — ABNORMAL HIGH (ref <150)

## 2019-07-13 LAB — HEMOGLOBIN A1C
Hgb A1c MFr Bld: 5.7 % of total Hgb — ABNORMAL HIGH (ref <5.7)
Mean Plasma Glucose: 117 (calc)
eAG (mmol/L): 6.5 (calc)

## 2019-07-13 LAB — TSH: TSH: 1.71 mIU/L (ref 0.40–4.50)

## 2019-07-17 ENCOUNTER — Telehealth: Payer: Self-pay | Admitting: Family Medicine

## 2019-07-17 ENCOUNTER — Telehealth: Payer: Self-pay | Admitting: Internal Medicine

## 2019-07-17 NOTE — Telephone Encounter (Signed)
Patients wife calling in stating that new puppy chewed up gray cord (possibly the transmitter). Patients wife calling back to see how they can go about getting a replacement  Please advise  [phone] is patients wife's cell

## 2019-07-17 NOTE — Telephone Encounter (Signed)
Result note sent to clinical pool.  Please contact patient to review the following (No MyChart Access):  1. Chemistry - Chemistry is similar to last time. He has chronic kidney disease, similar to last lab result. Not worsening. Otherwise electrolytes and liver enzyme normal.   2. Hemoglobin A1c (Diabetes screening) - 5.7, stable - same as last time 5.7, only mildly in range of Pre-Diabetes (>5.7 to 6.4)   3. PSA Prostate Cancer Screening - 0.1, negative.  4. TSH Thyroid Function Tests - Normal.  5. Cholesterol - Improved cholesterol - LDL down to 118.  6. CBC Blood Counts - Stable, mild low iron count but it is unchanged from last time. Platelets are improved.  Nobie Putnam, DO [facility] 07/17/2019, 11:49 AM  Nobie Putnam, DO [facility] 07/17/2019, 11:50 AM

## 2019-07-17 NOTE — Telephone Encounter (Signed)
Pt. Called requesting Lab results

## 2019-07-17 NOTE — Telephone Encounter (Signed)
Spoke to patients wife regarding transmitter. Ordered a Production assistant, radio. Informed Ms. Spinner. Advised her to call if she has any questions or concerns.

## 2019-07-17 NOTE — Telephone Encounter (Signed)
Patient advised.

## 2019-07-17 NOTE — Telephone Encounter (Signed)
Inform patient once it resulted will call back.

## 2019-07-17 NOTE — Telephone Encounter (Signed)
Pt  Is requesting lab  Result

## 2019-07-18 ENCOUNTER — Encounter: Payer: PPO | Admitting: Internal Medicine

## 2019-07-20 ENCOUNTER — Encounter: Payer: Self-pay | Admitting: Emergency Medicine

## 2019-07-20 ENCOUNTER — Emergency Department: Payer: PPO

## 2019-07-20 ENCOUNTER — Emergency Department
Admission: EM | Admit: 2019-07-20 | Discharge: 2019-07-20 | Disposition: A | Discharge status: Home / Self Care | Payer: PPO | Attending: Emergency Medicine | Admitting: Emergency Medicine

## 2019-07-20 ENCOUNTER — Other Ambulatory Visit: Payer: Self-pay

## 2019-07-20 DIAGNOSIS — Y92414 Local residential or business street as the place of occurrence of the external cause: Secondary | ICD-10-CM | POA: Insufficient documentation

## 2019-07-20 DIAGNOSIS — S3991XA Unspecified injury of abdomen, initial encounter: Secondary | ICD-10-CM | POA: Diagnosis not present

## 2019-07-20 DIAGNOSIS — R079 Chest pain, unspecified: Secondary | ICD-10-CM | POA: Diagnosis not present

## 2019-07-20 DIAGNOSIS — I2510 Atherosclerotic heart disease of native coronary artery without angina pectoris: Secondary | ICD-10-CM | POA: Insufficient documentation

## 2019-07-20 DIAGNOSIS — C9151 Adult T-cell lymphoma/leukemia (HTLV-1-associated), in remission: Secondary | ICD-10-CM | POA: Insufficient documentation

## 2019-07-20 DIAGNOSIS — S2220XA Unspecified fracture of sternum, initial encounter for closed fracture: Secondary | ICD-10-CM | POA: Diagnosis not present

## 2019-07-20 DIAGNOSIS — R0789 Other chest pain: Secondary | ICD-10-CM | POA: Diagnosis not present

## 2019-07-20 DIAGNOSIS — Z79899 Other long term (current) drug therapy: Secondary | ICD-10-CM | POA: Insufficient documentation

## 2019-07-20 DIAGNOSIS — Z7901 Long term (current) use of anticoagulants: Secondary | ICD-10-CM | POA: Diagnosis not present

## 2019-07-20 DIAGNOSIS — Y93I9 Activity, other involving external motion: Secondary | ICD-10-CM | POA: Diagnosis not present

## 2019-07-20 DIAGNOSIS — S199XXA Unspecified injury of neck, initial encounter: Secondary | ICD-10-CM | POA: Diagnosis not present

## 2019-07-20 DIAGNOSIS — I4819 Other persistent atrial fibrillation: Secondary | ICD-10-CM | POA: Diagnosis not present

## 2019-07-20 DIAGNOSIS — I1310 Hypertensive heart and chronic kidney disease without heart failure, with stage 1 through stage 4 chronic kidney disease, or unspecified chronic kidney disease: Secondary | ICD-10-CM | POA: Diagnosis not present

## 2019-07-20 DIAGNOSIS — N183 Chronic kidney disease, stage 3 unspecified: Secondary | ICD-10-CM | POA: Insufficient documentation

## 2019-07-20 DIAGNOSIS — I4891 Unspecified atrial fibrillation: Secondary | ICD-10-CM | POA: Diagnosis not present

## 2019-07-20 DIAGNOSIS — Z950 Presence of cardiac pacemaker: Secondary | ICD-10-CM | POA: Diagnosis not present

## 2019-07-20 DIAGNOSIS — S0990XA Unspecified injury of head, initial encounter: Secondary | ICD-10-CM | POA: Diagnosis not present

## 2019-07-20 DIAGNOSIS — Y999 Unspecified external cause status: Secondary | ICD-10-CM | POA: Insufficient documentation

## 2019-07-20 DIAGNOSIS — I10 Essential (primary) hypertension: Secondary | ICD-10-CM | POA: Diagnosis not present

## 2019-07-20 DIAGNOSIS — S299XXA Unspecified injury of thorax, initial encounter: Secondary | ICD-10-CM | POA: Diagnosis not present

## 2019-07-20 LAB — CBC WITH DIFFERENTIAL/PLATELET
Abs Immature Granulocytes: 0.02 10*3/uL (ref 0.00–0.07)
Basophils Absolute: 0 10*3/uL (ref 0.0–0.1)
Basophils Relative: 1 %
Eosinophils Absolute: 0.1 10*3/uL (ref 0.0–0.5)
Eosinophils Relative: 3 %
HCT: 36.1 % — ABNORMAL LOW (ref 39.0–52.0)
Hemoglobin: 11.8 g/dL — ABNORMAL LOW (ref 13.0–17.0)
Immature Granulocytes: 1 %
Lymphocytes Relative: 21 %
Lymphs Abs: 0.9 10*3/uL (ref 0.7–4.0)
MCH: 31.9 pg (ref 26.0–34.0)
MCHC: 32.7 g/dL (ref 30.0–36.0)
MCV: 97.6 fL (ref 80.0–100.0)
Monocytes Absolute: 0.4 10*3/uL (ref 0.1–1.0)
Monocytes Relative: 10 %
Neutro Abs: 2.7 10*3/uL (ref 1.7–7.7)
Neutrophils Relative %: 64 %
Platelets: 114 10*3/uL — ABNORMAL LOW (ref 150–400)
RBC: 3.7 MIL/uL — ABNORMAL LOW (ref 4.22–5.81)
RDW: 13.1 % (ref 11.5–15.5)
WBC: 4.2 10*3/uL (ref 4.0–10.5)
nRBC: 0 % (ref 0.0–0.2)

## 2019-07-20 LAB — BASIC METABOLIC PANEL
Anion gap: 11 (ref 5–15)
BUN: 28 mg/dL — ABNORMAL HIGH (ref 8–23)
CO2: 25 mmol/L (ref 22–32)
Calcium: 9.6 mg/dL (ref 8.9–10.3)
Chloride: 102 mmol/L (ref 98–111)
Creatinine, Ser: 1.3 mg/dL — ABNORMAL HIGH (ref 0.61–1.24)
GFR calc Af Amer: 56 mL/min — ABNORMAL LOW (ref >60)
GFR calc non Af Amer: 48 mL/min — ABNORMAL LOW (ref >60)
Glucose, Bld: 145 mg/dL — ABNORMAL HIGH (ref 70–99)
Potassium: 4.5 mmol/L (ref 3.5–5.1)
Sodium: 138 mmol/L (ref 135–145)

## 2019-07-20 MED ORDER — IOHEXOL 350 MG/ML SOLN
100.0000 mL | Freq: Once | INTRAVENOUS | Status: AC | PRN
  Administered 2019-07-20: 100 mL via INTRAVENOUS
  Filled 2019-07-20: qty 100

## 2019-07-20 MED ORDER — TRAMADOL HCL 50 MG PO TABS: 50.0000 mg | ORAL_TABLET | Freq: Four times a day (QID) | ORAL | 0 refills | Status: DC | PRN
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
## 2019-07-20 NOTE — Discharge Instructions (Signed)
Please call and schedule follow-up appointment with your primary care provider.  You have been prescribed tramadol which will help control pain.  Be aware, it may cause sleepiness or drowsiness.  Be careful when standing or changing positions as it may cause risk for fall.  You may also take Tylenol.  Use the incentive spirometry every couple of hours throughout the day.  This will help prevent you from developing pneumonia due to the pain with deep breath or cough.

## 2019-07-25 ENCOUNTER — Other Ambulatory Visit: Payer: Self-pay

## 2019-07-25 ENCOUNTER — Encounter: Payer: Self-pay | Admitting: Family Medicine

## 2019-07-25 ENCOUNTER — Ambulatory Visit (INDEPENDENT_AMBULATORY_CARE_PROVIDER_SITE_OTHER): Payer: PPO | Admitting: Family Medicine

## 2019-07-25 VITALS — BP 165/56 | HR 58 | Temp 96.8°F | Resp 16 | Ht 68.0 in | Wt 163.8 lb

## 2019-07-25 DIAGNOSIS — S2222XA Fracture of body of sternum, initial encounter for closed fracture: Secondary | ICD-10-CM | POA: Diagnosis not present

## 2019-07-25 MED ORDER — TRAMADOL HCL 50 MG PO TABS: 50.0000 mg | ORAL_TABLET | Freq: Four times a day (QID) | ORAL | 0 refills | Status: AC | PRN
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
## 2019-07-25 NOTE — Patient Instructions (Addendum)
Thank you for coming to the office today.  Sternum fracture that should keep healing well. Bruising is normal. It will go downwards on chest and body due to gravity.  Take Tramadol as needed for pain. Phase out of this if no longer needed.  Recommend to start taking Tylenol Extra Strength 500mg  tabs - take 1 to 2 tabs per dose (max 1000mg ) every 6-8 hours for pain (take regularly, don't skip a dose for next 7 days), max 24 hour daily dose is 6 tablets or 3000mg . In the future you can repeat the same everyday Tylenol course for 1-2 weeks at a time.   I recommend limiting driving for now completely short term. I agree with the rule of no driving on interstate / high way as well.  I would not completely revoke license at this time. But we can have a DMV medical evaluation in future if needed especially if any other safety concerns.  Please schedule a Follow-up Appointment to: Return if symptoms worsen or fail to improve, for sternum fracture.  If you have any other questions or concerns, please feel free to call the office or send a message through [place]. You may also schedule an earlier appointment if necessary.  Additionally, you may be receiving a survey about your experience at our office within a few days to 1 week by e-mail or mail. We value your feedback.  Nobie Putnam, DO [place]

## 2019-08-24 ENCOUNTER — Other Ambulatory Visit: Payer: Self-pay

## 2019-08-24 ENCOUNTER — Ambulatory Visit (INDEPENDENT_AMBULATORY_CARE_PROVIDER_SITE_OTHER): Payer: PPO | Admitting: Family Medicine

## 2019-08-24 ENCOUNTER — Encounter: Payer: Self-pay | Admitting: Family Medicine

## 2019-08-24 VITALS — BP 108/47 | HR 60 | Temp 97.5°F | Resp 16 | Ht 68.0 in | Wt 163.0 lb

## 2019-08-24 DIAGNOSIS — F3341 Major depressive disorder, recurrent, in partial remission: Secondary | ICD-10-CM | POA: Diagnosis not present

## 2019-08-24 DIAGNOSIS — F4322 Adjustment disorder with anxiety: Secondary | ICD-10-CM

## 2019-08-24 DIAGNOSIS — S2222XD Fracture of body of sternum, subsequent encounter for fracture with routine healing: Secondary | ICD-10-CM

## 2019-08-24 MED ORDER — BUSPIRONE HCL 5 MG PO TABS: 5.0000 mg | ORAL_TABLET | Freq: Three times a day (TID) | ORAL | 1 refills | Status: DC | PRN

## 2019-08-24 NOTE — Patient Instructions (Addendum)
Thank you for coming to the office today.  Cleared to resume more activity as sternum/rib is healing. Bruising will take time to resolve while on blood thinner.  For anxiety Added new medicine Buspar take 5mg  one tablet up to 1-3 times a day as needed for anxiety, can skip doses or not take every day if preferred, prior to move should take more regularly to help more. Keep in touch if you need a refill BEFORE you move.  Heart doctors should be able to order any refills before you move as well for other meds.  Once you locate new doctor, they can send a records request directly to [facility] or may be on the same computer system and can get it very easily.  Please schedule a Follow-up Appointment to: Return if symptoms worsen or fail to improve, for anxiety.  If you have any other questions or concerns, please feel free to call the office or send a message through [place]. You may also schedule an earlier appointment if necessary.  Additionally, you may be receiving a survey about your experience at our office within a few days to 1 week by e-mail or mail. We value your feedback.  Nobie Putnam, DO [place]

## 2019-08-24 NOTE — Progress Notes (Signed)
Subjective:    Patient ID: Clinton Joseph, male    DOB: 1928-01-24, 86 y.o.   MRN: [id number]  Clinton Joseph is a 86 y.o. male presenting on 08/24/2019 for sternum fracture (follow up from the last viist still sore but improved)   HPI   Sternum fracture Last visit 07/25/19, had hospital ED follow-up after closed non displaced sternum fracture Today he is doing better, pain has improved, now off tramadol >1 week.  Now can sleep flat on bed now, not sleeping in recliner or with wedge. On Tylenol PRN Still has bruising.  Major Depression, chronic recurrent partial remission / Adjustment Disorder with Anxiety Chronic history prior episodes of depression, different life stressors triggering in past. He has been on SSRI in past short term on sertraline / lexapro. Unsure if those helped. He now is reporting concerns with primary anxiety and stress due to adjusting to upcoming move with now moving out of state to [place] to live with grandson and they can help care for them now. In about 1-2 months will move, causing him stress and difficulty adjusting to this. He is sleeping well, and no other significant symptoms, askinga bout PRN medicine.   Depression screen [facility] 2/9 08/24/2019 02/28/2019 01/10/2019  Decreased Interest 1 0 0  Down, Depressed, Hopeless 1 0 0  PHQ - 2 Score 2 0 0  Altered sleeping 0 - 0  Tired, decreased energy 0 - 0  Change in appetite 0 - 0  Feeling bad or failure about yourself  0 - 0  Trouble concentrating 0 - 0  Moving slowly or fidgety/restless 0 - 0  Suicidal thoughts 0 - 0  PHQ-9 Score 2 - 0  Difficult doing work/chores Not difficult at all - Not difficult at all   GAD 7 : Generalized Anxiety Score 08/24/2019 04/04/2018  Nervous, Anxious, on Edge 2 3  Control/stop worrying 1 3  Worry too much - different things 2 3  Trouble relaxing 0 1  Restless 0 2  Easily annoyed or irritable 0 0  Afraid - awful might happen 0 3  Total GAD 7 Score 5 15  Anxiety  Difficulty Not difficult at all Not difficult at all     Social History   Tobacco Use  . Smoking status: Never Smoker  . Smokeless tobacco: Never Used  Substance Use Topics  . Alcohol use: Yes    Alcohol/week: 2.0 standard drinks    Types: 2 Glasses of wine per week  . Drug use: No    Review of Systems Per HPI unless specifically indicated above     Objective:    BP (!) 108/47   Pulse 60   Temp (!) 97.5 F (36.4 C) (Temporal)   Resp 16   Ht 5\' 8"  (1.727 m)   Wt 163 lb (73.9 kg)   SpO2 98%   BMI 24.78 kg/m   Wt Readings from Last 3 Encounters:  08/24/19 163 lb (73.9 kg)  07/25/19 163 lb 12.8 oz (74.3 kg)  07/20/19 168 lb (76.2 kg)    Physical Exam Vitals and nursing note reviewed.  Constitutional:      General: He is not in acute distress.    Appearance: He is well-developed. He is not diaphoretic.     Comments: Well-appearing, comfortable, cooperative  HENT:     Head: Normocephalic and atraumatic.  Eyes:     General:        Right eye: No discharge.  Left eye: No discharge.     Conjunctiva/sclera: Conjunctivae normal.  Neck:     Thyroid: No thyromegaly.  Cardiovascular:     Rate and Rhythm: Normal rate and regular rhythm.     Heart sounds: Normal heart sounds. No murmur.  Pulmonary:     Effort: Pulmonary effort is normal. No respiratory distress.     Breath sounds: Normal breath sounds. No wheezing or rales.  Musculoskeletal:        General: Normal range of motion.     Cervical back: Normal range of motion and neck supple.  Lymphadenopathy:     Cervical: No cervical adenopathy.  Skin:    General: Skin is warm and dry.     Findings: Bruising (resolving bruising across bilateral chest wall with some fading and gravity dependent shift) present. No erythema or rash.  Neurological:     Mental Status: He is alert and oriented to person, place, and time.  Psychiatric:        Behavior: Behavior normal.     Comments: Well groomed, good eye contact,  normal speech and thoughts        Results for orders placed or performed during the hospital encounter of 07/20/19  CBC with Differential  Result Value Ref Range   WBC 4.2 4.0 - 10.5 K/uL   RBC 3.70 (L) 4.22 - 5.81 MIL/uL   Hemoglobin 11.8 (L) 13.0 - 17.0 g/dL   HCT 36.1 (L) 39.0 - 52.0 %   MCV 97.6 80.0 - 100.0 fL   MCH 31.9 26.0 - 34.0 pg   MCHC 32.7 30.0 - 36.0 g/dL   RDW 13.1 11.5 - 15.5 %   Platelets 114 (L) 150 - 400 K/uL   nRBC 0.0 0.0 - 0.2 %   Neutrophils Relative % 64 %   Neutro Abs 2.7 1.7 - 7.7 K/uL   Lymphocytes Relative 21 %   Lymphs Abs 0.9 0.7 - 4.0 K/uL   Monocytes Relative 10 %   Monocytes Absolute 0.4 0.1 - 1.0 K/uL   Eosinophils Relative 3 %   Eosinophils Absolute 0.1 0.0 - 0.5 K/uL   Basophils Relative 1 %   Basophils Absolute 0.0 0.0 - 0.1 K/uL   Immature Granulocytes 1 %   Abs Immature Granulocytes 0.02 0.00 - 0.07 K/uL  Basic metabolic panel  Result Value Ref Range   Sodium 138 135 - 145 mmol/L   Potassium 4.5 3.5 - 5.1 mmol/L   Chloride 102 98 - 111 mmol/L   CO2 25 22 - 32 mmol/L   Glucose, Bld 145 (H) 70 - 99 mg/dL   BUN 28 (H) 8 - 23 mg/dL   Creatinine, Ser 1.30 (H) 0.61 - 1.24 mg/dL   Calcium 9.6 8.9 - 10.3 mg/dL   GFR calc non Af Amer 48 (L) >60 mL/min   GFR calc Af Amer 56 (L) >60 mL/min   Anion gap 11 5 - 15      Assessment & Plan:   Problem List Items Addressed This Visit    Major depressive disorder, recurrent episode, in partial remission (HCC) - Primary   Relevant Medications   busPIRone (BUSPAR) 5 MG tablet    Other Visit Diagnoses    Adjustment disorder with anxiety       Relevant Medications   busPIRone (BUSPAR) 5 MG tablet   Closed fracture of body of sternum with routine healing, subsequent encounter          #Sternum fracture, closed - healing No new concerns or  complication Still some bruising, resolving. Likely worse no anticoagulation.  #Major depression, in partial remission #Adjustment w/ anxiety Only mild  depressive symptoms. No significant concern Primarily mood is down adjusting to life stressor with move, and has predominantly anxiety worry symptoms Discussed option for PRN med as opposed to daily, start Buspar 5mg  1-3 times a day PRN can use more regularly if needed, discuss benefits, rx #90 and 1 refill, may need refill before move  He will check with Cardiology to get all refills 90 day prior to move to out of state [place]. He will locate new PCP in [place].  Meds ordered this encounter  Medications  . busPIRone (BUSPAR) 5 MG tablet    Sig: Take 1 tablet (5 mg total) by mouth 3 (three) times daily as needed.    Dispense:  90 tablet    Refill:  1     Follow up plan: Return if symptoms worsen or fail to improve, for anxiety.   Nobie Putnam, [facility] 08/24/2019, 11:40 AM

## 2019-08-31 ENCOUNTER — Telehealth: Payer: Self-pay | Admitting: Internal Medicine

## 2019-08-31 NOTE — Telephone Encounter (Signed)
Patient is moving out of state and wants to try and see Dr. Caryl Comes before he moves in July. Patient is overdue for a visit and will be gone by the next available. Is there any way we could fit him in  Please advise

## 2019-09-01 NOTE — Telephone Encounter (Signed)
I spoke with the patient's wife.  I have offered an appointment on 09/19/19 at 10:20 am with Dr. Caryl Comes for the patient. She is agreeable.  Per Mrs. Rosel, they are planning to move to [place] to be near their family.

## 2019-09-10 ENCOUNTER — Other Ambulatory Visit: Payer: Self-pay | Admitting: Physician Assistant

## 2019-09-11 ENCOUNTER — Telehealth: Payer: Self-pay | Admitting: Internal Medicine

## 2019-09-11 NOTE — Telephone Encounter (Signed)
Refill sent to pharmacy as requested

## 2019-09-11 NOTE — Telephone Encounter (Signed)
°*  STAT* If patient is at the pharmacy, call can be transferred to refill team.   1. Which medications need to be refilled? (please list name of each medication and dose if known) Entresto 24-26 MG  2. Which pharmacy/location (including street and city if local pharmacy) is medication to be sent to? [facility] in [place]   3. Do they need a 30 day or 90 day supply? 90 day

## 2019-09-19 ENCOUNTER — Encounter: Payer: Self-pay | Admitting: Internal Medicine

## 2019-09-19 ENCOUNTER — Other Ambulatory Visit: Payer: Self-pay

## 2019-09-19 ENCOUNTER — Ambulatory Visit (INDEPENDENT_AMBULATORY_CARE_PROVIDER_SITE_OTHER): Payer: PPO | Admitting: Internal Medicine

## 2019-09-19 ENCOUNTER — Ambulatory Visit (INDEPENDENT_AMBULATORY_CARE_PROVIDER_SITE_OTHER): Payer: PPO | Admitting: *Deleted

## 2019-09-19 VITALS — BP 148/60 | HR 60 | Ht 67.5 in | Wt 163.0 lb

## 2019-09-19 DIAGNOSIS — I10 Essential (primary) hypertension: Secondary | ICD-10-CM

## 2019-09-19 DIAGNOSIS — I442 Atrioventricular block, complete: Secondary | ICD-10-CM

## 2019-09-19 DIAGNOSIS — I4819 Other persistent atrial fibrillation: Secondary | ICD-10-CM | POA: Diagnosis not present

## 2019-09-19 DIAGNOSIS — I471 Supraventricular tachycardia: Secondary | ICD-10-CM

## 2019-09-19 DIAGNOSIS — Z950 Presence of cardiac pacemaker: Secondary | ICD-10-CM | POA: Diagnosis not present

## 2019-09-19 DIAGNOSIS — I5022 Chronic systolic (congestive) heart failure: Secondary | ICD-10-CM

## 2019-09-19 LAB — CUP PACEART REMOTE DEVICE CHECK
Battery Remaining Longevity: 109 mo
Battery Remaining Percentage: 95.5 %
Battery Voltage: 2.99 V
Brady Statistic AP VP Percent: 79 %
Brady Statistic AP VS Percent: 1 %
Brady Statistic AS VP Percent: 21 %
Brady Statistic AS VS Percent: 1 %
Brady Statistic RA Percent Paced: 78 %
Brady Statistic RV Percent Paced: 99 %
Date Time Interrogation Session: 20210531020012
Implantable Lead Implant Date: 20150818
Implantable Lead Implant Date: 20150818
Implantable Lead Location: 753859
Implantable Lead Location: 753860
Implantable Lead Model: 1948
Implantable Pulse Generator Implant Date: 20150818
Lead Channel Impedance Value: 390 Ohm
Lead Channel Impedance Value: 790 Ohm
Lead Channel Pacing Threshold Amplitude: 0.5 V
Lead Channel Pacing Threshold Amplitude: 0.5 V
Lead Channel Pacing Threshold Pulse Width: 0.5 ms
Lead Channel Pacing Threshold Pulse Width: 0.5 ms
Lead Channel Sensing Intrinsic Amplitude: 10 mV
Lead Channel Sensing Intrinsic Amplitude: 4.6 mV
Lead Channel Setting Pacing Amplitude: 2 V
Lead Channel Setting Pacing Amplitude: 2.5 V
Lead Channel Setting Pacing Pulse Width: 0.5 ms
Lead Channel Setting Sensing Sensitivity: 4 mV
Pulse Gen Model: 2240
Pulse Gen Serial Number: 7664739

## 2019-09-19 MED ORDER — AMIODARONE HCL 200 MG PO TABS: ORAL_TABLET | ORAL | Status: DC

## 2019-09-19 NOTE — Progress Notes (Signed)
Patient Care Team: Olin Hauser, DO as PCP - General (Family Medicine) Rockey Situ Kathlene November, MD as PCP - Cardiology (Cardiology) Minna Merritts, MD as Consulting Physician (Cardiology)   HPI  Clinton Joseph is a 86 y.o. male Seen in follow-up for pacemaker implanted 8/15 by Dr. Greggory Brandy for syncope and Mobitz 2 heart block and intermittent complete heart block and pauses of greater than 6 seconds.  Noted 12/19 to have atrial flutter atypical>>  started on apixoban  Decision made not to pursue cardioversion   Seen 1/20 agreeable to undertake DCCV>>NSR >>aflutter    Started amio, carvedilol and repeat cardioversion scheduled.  Converted spontaneously   Patient denies symptoms of GI intolerance, sun sensitivity, neurological symptoms attributable to amiodarone.  The patient denies chest pain, shortness of breath, nocturnal dyspnea, orthopnea or peripheral edema.  There have been no palpitations, lightheadedness or syncope.    Moving to [place] where there son lives     Date Cr K TSH LFTs Hgb  12/19 1.29     13.3  1/20 1.36 5.0      3/21 1.3 4.5 1.71 35 11.8      DATE TEST EF   2012 LHC   No obstructive CAD  8/15 Echo   55-60 % Ao Sclerosis  2/20 Echo   20% RV dysfn;  MR mod-sev; TR mod-sev  6/20 Echo  45-50%          Past Medical History:  Diagnosis Date  . Anxiety state, unspecified   . Arthritis    a. Hands.  . Atrial flutter ([place])    a. Dx 03/2018-->Eliquis 5 BID started (CHA2DS2VASc = 5).  . Diffuse large B cell lymphoma ([place])    a. 2007 s/p chemo - followed by Dr. Jeb Levering.  . H/O echocardiogram    a. 11/2013 Echo: EF 55-60%, mildly dil LA, mild to mod Ao sclerosis.  . Hyperlipidemia   . Hypertensive heart disease   . Mobitz II    a. 11/2013 syncope-->s/p SJM Assurity DR model [id number] (ser # [id number]).  . Non-obstructive CAD    a. 10/2010 Cath: >M nl, LAD/LCX min irregs, RCA 40p, EF 60%.  . Tremor    a. right hand  . Urticaria,  unspecified   . Ventricular tachycardia (HCC)    a. 2012->broke with amio, neg w/u.    Past Surgical History:  Procedure Laterality Date  . APPENDECTOMY  1948  . CARDIAC CATHETERIZATION  11/17/2010   [facility]- LAD min irregs, LCX min irregs, RCA 40p, EF 60%.   . CARDIOVERSION N/A 05/09/2018   Procedure: CARDIOVERSION (CATH LAB);  Surgeon: Minna Merritts, MD;  Location: [facility] ORS;  Service: Cardiovascular;  Laterality: N/A;  . CATARACT EXTRACTION, BILATERAL Bilateral ~ 2012  . INSERT / REPLACE / REMOVE PACEMAKER  12/05/2013  . PERMANENT PACEMAKER INSERTION N/A 12/05/2013   Procedure: PERMANENT PACEMAKER INSERTION;  Surgeon: Coralyn Mark, MD;  Location: [place] CATH LAB;  Service: Cardiovascular;  Laterality: N/A;    Current Outpatient Medications  Medication Sig Dispense Refill  . acetaminophen (TYLENOL) 650 MG CR tablet Take 1,300 mg by mouth daily as needed.     Marland Kitchen amiodarone (PACERONE) 200 MG tablet Take 1 tablet (200 mg total) by mouth daily. 90 tablet 1  . apixaban (ELIQUIS) 5 MG TABS tablet Take 1 tablet (5 mg total) by mouth 2 (two) times daily. 180 tablet 1  . busPIRone (BUSPAR) 5 MG tablet Take 1 tablet (5 mg total) by  mouth 3 (three) times daily as needed. 90 tablet 1  . carvedilol (COREG) 3.125 MG tablet Take 1 tablet (3.125 mg total) by mouth 2 (two) times daily. 180 tablet 1  . cetirizine (ZYRTEC) 10 MG tablet Take 10 mg by mouth daily as needed.     . cholecalciferol (VITAMIN D3) 25 MCG (1000 UT) tablet Take 1,000 Units by mouth 2 (two) times daily.    Marland Kitchen ENTRESTO 24-26 MG TAKE 1 TABLET BY MOUTH TWICE A DAY 180 tablet 0  . furosemide (LASIX) 20 MG tablet Take 1 tablet (20 mg) by mouth once every other day 45 tablet 1  . Multiple Vitamins-Minerals (MULTIVITAMIN WITH MINERALS) tablet Take 1 tablet by mouth daily. Centrum Silver    . Omega-3 Fatty Acids (FISH OIL) 1000 MG CAPS Take 1,000-2,000 mg by mouth See admin instructions. Take 2 capsules (2000 mg) by mouth in the morninig & 1  capsule (1000 mg) by mouth night.    . ondansetron (ZOFRAN ODT) 4 MG disintegrating tablet Take 1 tablet (4 mg total) by mouth every 8 (eight) hours as needed for nausea or vomiting. 30 tablet 2  . vitamin B-12 (CYANOCOBALAMIN) 1000 MCG tablet Take 1,000 mcg by mouth daily.     No current facility-administered medications for this visit.    Allergies  Allergen Reactions  . Keflex [Cephalexin]     Diarrhea   . Penicillins Hives and Rash    Did it involve swelling of the face/tongue/throat, SOB, or low BP? No Did it involve sudden or severe rash/hives, skin peeling, or any reaction on the inside of your mouth or nose? Unknown Did you need to seek medical attention at a hospital or doctor's office? Unknown When did it last happen?40 years ago If all above answers are "NO", may proceed with cephalosporin use.   . Tape Rash    Review of Systems negative except from HPI and PMH  Physical Exam BP (!) 148/60 (BP Location: Left Arm, Patient Position: Sitting, Cuff Size: Normal)   Pulse 60   Ht 5' 7.5" (1.715 m)   Wt 163 lb (73.9 kg)   SpO2 97%   BMI 25.15 kg/m  Well developed and well nourished in no acute distress HENT normal Neck supple with JVP-flat Clear Device pocket well healed; without hematoma or erythema.  There is no tethering  Regular rate and rhythm, no * * murmur Abd-soft with active BS No Clubbing cyanosis * edema Skin-warm and dry A & Oriented  Grossly normal sensory and motor function  ECG AV pacing at 60 18/20/49      Assessment and  Plan  Complete heart block now with some intrinsic conduction with prolonged PR interval and bifascicular block  Pacemaker-[employer]. [employer]  The patient's device was interrogated.  The information was reviewed. No changes were made in the programming.     Syncope     Atrial fib persistent  High Risk Medication Surveillance   Hypertension     Atrial tachycardia  Chronotropic incompetence-new  Congestive heart  failure-chronic-systolic/diastolic   Euvolemic continue current meds  Chronotropically incompetent with 80% atrial paced at a lower rate limit of 60.  We will activate rate response  On Anticoagulation;  No bleeding issues   Tolerating amio--  No intercurrent atrial fibrillation or flutter so will decrease amio 1400>>1000 MG/WEEK  No interval syncope  Will be moving to [place], and will need to establish with cardiology at that time

## 2019-09-19 NOTE — Patient Instructions (Signed)
Medication Instructions:  - Your physician has recommended you make the following change in your medication:   1) Decrease amiodarone 200 mg- take 1 tablet by mouth 5 days a week  *If you need a refill on your cardiac medications before your next appointment, please call your pharmacy*   Lab Work: - none ordered  If you have labs (blood work) drawn today and your tests are completely normal, you will receive your results only by: Marland Kitchen MyChart Message (if you have MyChart) OR . A paper copy in the mail If you have any lab test that is abnormal or we need to change your treatment, we will call you to review the results.   Testing/Procedures: - none ordered   Follow-Up: At [facility], you and your health needs are our priority.  As part of our continuing mission to provide you with exceptional heart care, we have created designated Provider Care Teams.  These Care Teams include your primary Cardiologist (physician) and Advanced Practice Providers (APPs -  Physician Assistants and Nurse Practitioners) who all work together to provide you with the care you need, when you need it.  We recommend signing up for the patient portal called "MyChart".  Sign up information is provided on this After Visit Summary.  MyChart is used to connect with patients for Virtual Visits (Telemedicine).  Patients are able to view lab/test results, encounter notes, upcoming appointments, etc.  Non-urgent messages can be sent to your provider as well.   To learn more about what you can do with MyChart, go to [email or web address].    Your next appointment:   As needed   The format for your next appointment:   In Person  Provider:   Virl Axe, MD   Other Instructions Good luck with you move! We wish you the best!!

## 2019-09-19 NOTE — Progress Notes (Signed)
Remote pacemaker transmission.   

## 2019-09-20 LAB — CUP PACEART INCLINIC DEVICE CHECK
Battery Remaining Longevity: 106 mo
Battery Voltage: 2.99 V
Brady Statistic RA Percent Paced: 78 %
Brady Statistic RV Percent Paced: 99.98 %
Date Time Interrogation Session: 20210601100300
Implantable Lead Implant Date: 20150818
Implantable Lead Implant Date: 20150818
Implantable Lead Location: 753859
Implantable Lead Location: 753860
Implantable Lead Model: 1948
Implantable Pulse Generator Implant Date: 20150818
Lead Channel Impedance Value: 387.5 Ohm
Lead Channel Impedance Value: 787.5 Ohm
Lead Channel Pacing Threshold Amplitude: 0.5 V
Lead Channel Pacing Threshold Amplitude: 0.5 V
Lead Channel Pacing Threshold Pulse Width: 0.5 ms
Lead Channel Pacing Threshold Pulse Width: 0.5 ms
Lead Channel Sensing Intrinsic Amplitude: 10 mV
Lead Channel Sensing Intrinsic Amplitude: 4.6 mV
Lead Channel Setting Pacing Amplitude: 2 V
Lead Channel Setting Pacing Amplitude: 2.5 V
Lead Channel Setting Pacing Pulse Width: 0.5 ms
Lead Channel Setting Sensing Sensitivity: 4 mV
Pulse Gen Model: 2240
Pulse Gen Serial Number: 7664739

## 2019-09-28 ENCOUNTER — Other Ambulatory Visit: Payer: Self-pay | Admitting: Family

## 2019-10-09 ENCOUNTER — Ambulatory Visit: Payer: PPO | Admitting: Family

## 2019-10-09 ENCOUNTER — Other Ambulatory Visit: Payer: Self-pay

## 2019-10-09 ENCOUNTER — Encounter: Payer: Self-pay | Admitting: Family

## 2019-10-09 VITALS — BP 120/60 | HR 60 | Ht 68.0 in | Wt 160.2 lb

## 2019-10-09 DIAGNOSIS — I442 Atrioventricular block, complete: Secondary | ICD-10-CM | POA: Diagnosis not present

## 2019-10-09 DIAGNOSIS — I4819 Other persistent atrial fibrillation: Secondary | ICD-10-CM

## 2019-10-09 DIAGNOSIS — I10 Essential (primary) hypertension: Secondary | ICD-10-CM | POA: Diagnosis not present

## 2019-10-09 DIAGNOSIS — Z79899 Other long term (current) drug therapy: Secondary | ICD-10-CM

## 2019-10-09 DIAGNOSIS — I5022 Chronic systolic (congestive) heart failure: Secondary | ICD-10-CM

## 2019-10-09 DIAGNOSIS — Z7901 Long term (current) use of anticoagulants: Secondary | ICD-10-CM | POA: Diagnosis not present

## 2019-10-09 DIAGNOSIS — Z950 Presence of cardiac pacemaker: Secondary | ICD-10-CM

## 2019-10-09 NOTE — Patient Instructions (Signed)
Medication Instructions:  Your physician recommends that you continue on your current medications as directed. Please refer to the Current Medication list given to you today.  *If you need a refill on your cardiac medications before your next appointment, please call your pharmacy*   Lab Work: None Ordered. If you have labs (blood work) drawn today and your tests are completely normal, you will receive your results only by: Marland Kitchen MyChart Message (if you have MyChart) OR . A paper copy in the mail If you have any lab test that is abnormal or we need to change your treatment, we will call you to review the results.   Testing/Procedures: None Ordered.   Follow-Up: At [facility], you and your health needs are our priority.  As part of our continuing mission to provide you with exceptional heart care, we have created designated Provider Care Teams.  These Care Teams include your primary Cardiologist (physician) and Advanced Practice Providers (APPs -  Physician Assistants and Nurse Practitioners) who all work together to provide you with the care you need, when you need it.  We recommend signing up for the patient portal called "MyChart".  Sign up information is provided on this After Visit Summary.  MyChart is used to connect with patients for Virtual Visits (Telemedicine).  Patients are able to view lab/test results, encounter notes, upcoming appointments, etc.  Non-urgent messages can be sent to your provider as well.   To learn more about what you can do with MyChart, go to [email or web address].    Your next appointment:   Establish Care in [place]   The format for your next appointment:   In Person  Provider:   N/A   Other Instructions N/A

## 2019-10-09 NOTE — Progress Notes (Signed)
Office Visit    Patient Name: Clinton Joseph Date of Encounter: 10/09/2019  Primary Care Provider:  Olin Hauser, DO Primary Cardiologist:  Clinton Rogue, MD Electrophysiologist:  None   Chief Complaint    Clinton Joseph is a 86 y.o. male with a hx of nonobstructive CAD, lymphoma in 2006 with chemotherapy x7 cycles, HTN, bradycardia, Mobitz type II with syncope s/p PPM, atrial fib/fluuteron Eliquis post briefly successful DCCV 04/2018 and spontaneous pharmacological conversion 09/5463, chronic systolic CHF, HLD presents today for follow up of systolic CHF.   Past Medical History    Past Medical History:  Diagnosis Date  . Anxiety state, unspecified   . Arthritis    a. Hands.  . Atrial flutter ([place])    a. Dx 03/2018-->Eliquis 5 BID started (CHA2DS2VASc = 5).  . Diffuse large B cell lymphoma ([place])    a. 2007 s/p chemo - followed by Dr. Jeb Joseph.  . H/O echocardiogram    a. 11/2013 Echo: EF 55-60%, mildly dil LA, mild to mod Ao sclerosis.  . Hyperlipidemia   . Hypertensive heart disease   . Mobitz II    a. 11/2013 syncope-->s/p SJM Assurity DR model [id number] (ser # [id number]).  . Non-obstructive CAD    a. 10/2010 Cath: >M nl, LAD/LCX min irregs, RCA 40p, EF 60%.  . Tremor    a. right hand  . Urticaria, unspecified   . Ventricular tachycardia (HCC)    a. 2012->broke with amio, neg w/u.   Past Surgical History:  Procedure Laterality Date  . APPENDECTOMY  1948  . CARDIAC CATHETERIZATION  11/17/2010   [facility]- LAD min irregs, LCX min irregs, RCA 40p, EF 60%.   . CARDIOVERSION N/A 05/09/2018   Procedure: CARDIOVERSION (CATH LAB);  Surgeon: Clinton Merritts, MD;  Location: [facility] ORS;  Service: Cardiovascular;  Laterality: N/A;  . CATARACT EXTRACTION, BILATERAL Bilateral ~ 2012  . INSERT / REPLACE / REMOVE PACEMAKER  12/05/2013  . PERMANENT PACEMAKER INSERTION N/A 12/05/2013   Procedure: PERMANENT PACEMAKER INSERTION;  Surgeon: Clinton Mark, MD;  Location: [place]  CATH LAB;  Service: Cardiovascular;  Laterality: N/A;    Allergies  Allergies  Allergen Reactions  . Keflex [Cephalexin]     Diarrhea   . Penicillins Hives and Rash    Did it involve swelling of the face/tongue/throat, SOB, or low BP? No Did it involve sudden or severe rash/hives, skin peeling, or any reaction on the inside of your mouth or nose? Unknown Did you need to seek medical attention at a hospital or doctor's office? Unknown When did it last happen?40 years ago If all above answers are "NO", may proceed with cephalosporin use.   . Tape Rash    History of Present Illness    Clinton Joseph is a 86 y.o. male with a hx of  hx of nonobstructive CAD, lymphoma in 2006 with chemotherapy x7 cycles, HTN, bradycardia, Mobitz type II with syncope s/p PPM, atrial fib/fluuteron Eliquis post briefly successful DCCV 04/2018 and spontaneous pharmacological conversion 0/3546, chronic systolic CHF, HLD. He was last seen 09/19/19 by Dr. Caryl Joseph.  [name] underwent cardiac cath 10/2017 with nonobstructive CAD. Noted to have Mobitz type II heart block with syncope and underwent successful PPM implantation 11/2013. Echo at that time with LVEF 55-60%, mildly dilated LA, mild-moderate aortic valve sclerosis w/o stenosis. Late 2019 noted elevated atrial rated by remote device monitoring consistent with atrial fib/flutter. Due to [facility] he was placed on Eliquis 5mg  BID. He had  difficulty with fluid retention in the setting of atrial fib/flutter and was started on Lasix 04/2018. Underwent successful cardioversion 05/09/18. Echo 05/26/18 with LVEF <20%, mildly dilated LV, global hypokinesis, mildly reduced RV systolic function with mildly enlarged RV cavity, mildly elevated RVSP of 56.6 mmHg, moderately dilated LA, mildly dilated RA< mod-severe MR, moderate to severe TR. Started on Coreg 6.25 and Amiodarone load. At visit with EP 05/31/18 he was noted to be AV paced. 06/15/18 he was started on  Entresto. Coreg dose was decreased 07/28/18 secondary to hypotension. Echo June 2020 with EF 45-50%.   Seen 05/2019 for cardiology follow up with no complaints. No changes were made. He was scheduled for follow up with Dr. Caryl Joseph. He was euvolemic. His device interrogation showed he was chronotropically incompetent with 80% atrial paced at lower rate of 60 bpm and his rate response was activated. He Amiodarone was decreased from 1400mg  to 1000mg  per week as he was maintaining NSR.   Reports feeling well. Has been working hard packing up he and his wife's home for their move to [place] to live with their son. Moving on Saturday. Reports no shortness of breath nor dyspnea on exertion. Reports no chest pain, pressure, or tightness. No edema, orthopnea, PND. Reports no palpitations.  EKGs/Labs/Other Studies Reviewed:   The following studies were reviewed today:  Echo 10/14/18  1. The left ventricle has mildly reduced systolic function, with an  ejection fraction of 45-50%. The cavity size was normal. There is  moderately increased left ventricular wall thickness. Left ventricular  diastolic function could not be evaluated. There   is abnormal septal motion consistent with RV pacemaker.   2. The right ventricle has normal systolc function. The cavity was mildly  enlarged. There is no increase in right ventricular wall thickness.   Remote Device Check 03/15/19 Normal device function. Battery status good. Lead measurements unchanged. Histograms appropriate.   EKG:  EKG is ordered today.  The ekg ordered today demonstrates Av sequential or dual cahmber PPM rate 60 bpm.   Recent Labs: 07/12/2019: ALT 35; TSH 1.71 07/20/2019: BUN 28; Creatinine, Ser 1.30; Hemoglobin 11.8; Platelets 114; Potassium 4.5; Sodium 138  Recent Lipid Panel    Component Value Date/Time   CHOL 175 07/12/2019 1037   TRIG 169 (H) 07/12/2019 1037   HDL 28 (L) 07/12/2019 1037   CHOLHDL 6.3 (H) 07/12/2019 1037   VLDL 30  06/12/2016 0001   LDLCALC 118 (H) 07/12/2019 1037    Home Medications   Current Meds  Medication Sig  . acetaminophen (TYLENOL) 650 MG CR tablet Take 1,300 mg by mouth daily as needed.   Marland Kitchen amiodarone (PACERONE) 200 MG tablet Take 1 tablet (200 mg) by mouth 5 days a week  . apixaban (ELIQUIS) 5 MG TABS tablet Take 1 tablet (5 mg total) by mouth 2 (two) times daily.  . busPIRone (BUSPAR) 5 MG tablet Take 1 tablet (5 mg total) by mouth 3 (three) times daily as needed.  . carvedilol (COREG) 3.125 MG tablet Take 1 tablet (3.125 mg total) by mouth 2 (two) times daily.  . cetirizine (ZYRTEC) 10 MG tablet Take 10 mg by mouth daily as needed.   . cholecalciferol (VITAMIN D3) 25 MCG (1000 UT) tablet Take 1,000 Units by mouth 2 (two) times daily.  Marland Kitchen ENTRESTO 24-26 MG TAKE 1 TABLET BY MOUTH TWICE A DAY  . furosemide (LASIX) 20 MG tablet Take 1 tablet (20 mg) by mouth once every other day  . Multiple Vitamins-Minerals (MULTIVITAMIN WITH  MINERALS) tablet Take 1 tablet by mouth daily. Centrum Silver  . Omega-3 Fatty Acids (FISH OIL) 1000 MG CAPS Take 1,000-2,000 mg by mouth See admin instructions. Take 2 capsules (2000 mg) by mouth in the morninig & 1 capsule (1000 mg) by mouth night.  . ondansetron (ZOFRAN ODT) 4 MG disintegrating tablet Take 1 tablet (4 mg total) by mouth every 8 (eight) hours as needed for nausea or vomiting.  . vitamin B-12 (CYANOCOBALAMIN) 1000 MCG tablet Take 1,000 mcg by mouth daily.      Review of Systems   Review of Systems  Constitutional: Negative for chills, fever and malaise/fatigue.  Cardiovascular: Negative for chest pain, dyspnea on exertion, leg swelling, near-syncope, orthopnea, palpitations and syncope.  Respiratory: Negative for cough, shortness of breath and wheezing.   Gastrointestinal: Negative for nausea and vomiting.  Neurological: Negative for dizziness, light-headedness and weakness.   All other systems reviewed and are otherwise negative except as noted  above.  Physical Exam    VS:  BP 120/60 (BP Location: Left Arm, Patient Position: Sitting, Cuff Size: Normal)   Pulse 60   Ht 5\' 8"  (1.727 m)   Wt 160 lb 4 oz (72.7 kg)   SpO2 97%   BMI 24.37 kg/m  , BMI Body mass index is 24.37 kg/m. GEN: Well nourished, well developed, in no acute distress. HEENT: normal. Neck: Supple, no JVD, carotid bruits, or masses. Cardiac: RRR, no murmurs, rubs, or gallops. No clubbing, cyanosis, edema.  Radials/PT 2+ and equal bilaterally.  Respiratory:  Respirations regular and unlabored, clear to auscultation bilaterally. GI: Soft, nontender, nondistended. MS: No deformity or atrophy. Skin: Warm and dry, no rash. Neuro:  Strength and sensation are intact. Psych: Normal affect.  Assessment & Plan    1. Nonobstructive CAD - Stable with no anginal symptoms.  As EF improved on GDMT for HFrEF will not pursue repeat ischemic evaluation at this time. Continue beta blocker. No aspirin secondary to chronic anticoagulation.   2. Persistent atrial fib/flutter - Recent remote device check with no atrial fib/flutter. CHADS2VASc of at least 5 (CHF, HTN, agex2, vascular disease) - remains anticoagulated on Eliquis with no bleeding complications. Does not qualify for reduced dose. On amiodarone therapy - 07/12/19 TSH 1.71, AST 45, ALT 35. On Amiodarone 1000mg  per week per EP.  3. HFrEF - Euvolemic and well compensated on exam. NYHA I.  Echo 10/14/18 with improvement in LVEF to 45-50%. Continue GDMT Coreg, Entresto, Lasix. No spironolactone secondary to hx of relative hyperkalemia.   4. Syncope with Mobitz type II - s/p PPM. Follows with EP. No recurrent syncope.   5. HTN - BP well controlled. Continue present antihypertensive regimen. Continue to monitor at home.   Disposition: Follow up with cardiology to be established in [place].   Loel Dubonnet, NP 10/09/2019, 9:30 PM

## 2019-10-25 ENCOUNTER — Other Ambulatory Visit: Payer: Self-pay | Admitting: Family Medicine

## 2019-10-25 DIAGNOSIS — F4322 Adjustment disorder with anxiety: Secondary | ICD-10-CM

## 2019-10-25 NOTE — Telephone Encounter (Signed)
Requested Prescriptions  Pending Prescriptions Disp Refills  . busPIRone (BUSPAR) 5 MG tablet [Pharmacy Med Name: BUSPIRONE HCL 5 MG TABLET] 90 tablet 1    Sig: TAKE 1 TABLET BY MOUTH THREE TIMES A DAY AS NEEDED     Psychiatry: Anxiolytics/Hypnotics - Non-controlled Passed - 10/25/2019  1:26 AM      Passed - Valid encounter within last 6 months    Recent Outpatient Visits          2 months ago Major depressive disorder, recurrent episode, in partial remission [facility])   [place], DO   3 months ago Closed fracture of body of sternum, initial encounter   [place], DO   3 months ago Pre-diabetes   [place], DO   9 months ago Bilateral leg weakness   [place], DO   1 year ago Nausea   [facility] Parks Ranger, Devonne Doughty, DO      Future Appointments            In 2 months Parks Ranger, Devonne Doughty, [facility], [facility]

## 2019-11-24 ENCOUNTER — Other Ambulatory Visit: Payer: Self-pay | Admitting: Family

## 2019-11-24 DIAGNOSIS — I4819 Other persistent atrial fibrillation: Secondary | ICD-10-CM

## 2019-11-24 DIAGNOSIS — I420 Dilated cardiomyopathy: Secondary | ICD-10-CM

## 2019-11-27 ENCOUNTER — Ambulatory Visit: Payer: PPO | Admitting: Cardiovascular Disease

## 2020-01-03 ENCOUNTER — Other Ambulatory Visit: Payer: Medicare Other

## 2020-01-05 ENCOUNTER — Encounter: Payer: PPO | Admitting: Family Medicine

## 2020-01-10 ENCOUNTER — Encounter: Payer: PPO | Admitting: Family Medicine

## 2020-03-05 ENCOUNTER — Ambulatory Visit: Payer: Medicare Other

## 2021-02-18 DEATH — deceased
